# Patient Record
Sex: Male | Born: 1935
Health system: Southern US, Community
[De-identification: ages and names within clinical notes are randomized; demographics above are authoritative.]

## PROBLEM LIST (undated history)

## (undated) DIAGNOSIS — C801 Malignant (primary) neoplasm, unspecified: Secondary | ICD-10-CM

## (undated) DIAGNOSIS — H269 Unspecified cataract: Secondary | ICD-10-CM

## (undated) DIAGNOSIS — M1990 Unspecified osteoarthritis, unspecified site: Secondary | ICD-10-CM

## (undated) DIAGNOSIS — M5416 Radiculopathy, lumbar region: Secondary | ICD-10-CM

## (undated) DIAGNOSIS — E7800 Pure hypercholesterolemia, unspecified: Secondary | ICD-10-CM

## (undated) HISTORY — PX: CHOLECYSTECTOMY: SHX55

## (undated) HISTORY — PX: TRACHEOSTOMY TUBE PLACEMENT: SHX814

## (undated) HISTORY — PX: TONSILLECTOMY: SUR1361

## (undated) HISTORY — DX: Unspecified osteoarthritis, unspecified site: M19.90

## (undated) HISTORY — DX: Pure hypercholesterolemia, unspecified: E78.00

## (undated) HISTORY — PX: PROSTATE SURGERY: SHX751

## (undated) HISTORY — DX: Unspecified cataract: H26.9

## (undated) HISTORY — PX: APPENDECTOMY: SHX54

---

## 1988-12-18 DIAGNOSIS — C801 Malignant (primary) neoplasm, unspecified: Secondary | ICD-10-CM

## 1988-12-18 HISTORY — DX: Malignant (primary) neoplasm, unspecified: C80.1

## 2002-08-07 ENCOUNTER — Encounter: Payer: Self-pay | Admitting: Orthopedic Surgery

## 2002-08-11 ENCOUNTER — Inpatient Hospital Stay (HOSPITAL_COMMUNITY): Admission: RE | Admit: 2002-08-11 | Discharge: 2002-08-12 | Payer: Self-pay | Admitting: Orthopedic Surgery

## 2004-09-16 ENCOUNTER — Inpatient Hospital Stay (HOSPITAL_COMMUNITY): Admission: EM | Admit: 2004-09-16 | Discharge: 2004-09-16 | Payer: Self-pay | Admitting: Emergency Medicine

## 2004-09-17 ENCOUNTER — Inpatient Hospital Stay (HOSPITAL_COMMUNITY): Admission: EM | Admit: 2004-09-17 | Discharge: 2004-09-20 | Payer: Self-pay | Admitting: Emergency Medicine

## 2004-09-18 ENCOUNTER — Encounter (INDEPENDENT_AMBULATORY_CARE_PROVIDER_SITE_OTHER): Payer: Self-pay | Admitting: *Deleted

## 2005-03-15 IMAGING — US US ABDOMEN COMPLETE
1 series · 14 of 25 positions shown · non-contrast
Comparison: none

CLINICAL DATA: Abdominal pain.  
 COMPLETE ABDOMINAL ULTRASOUND ? 09/16/2003

[Series 1: unknown · 0.33mm/px · 14 of 59 slices shown]
[im 1/59]
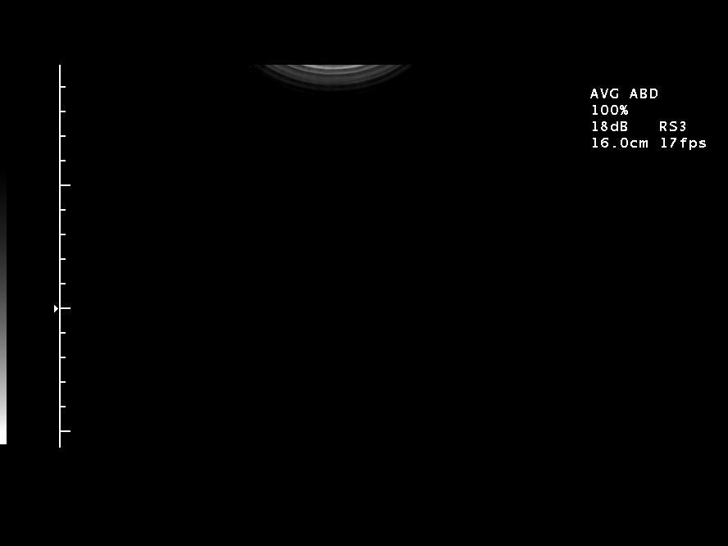
[im 5/59]
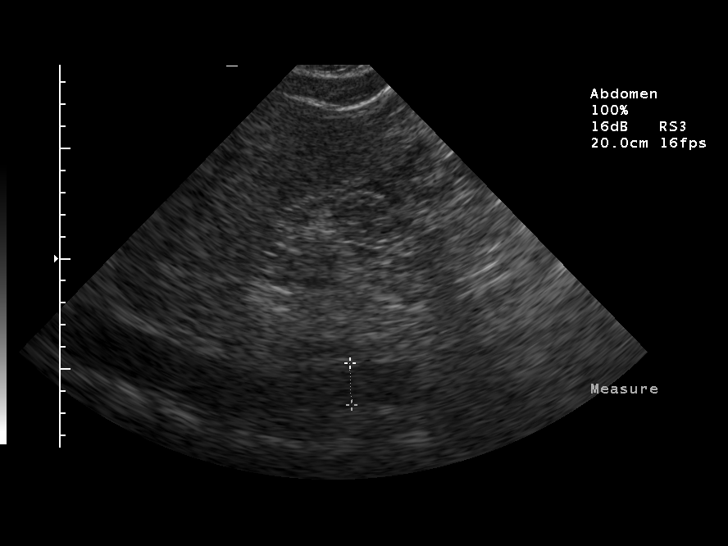
[im 10/59]
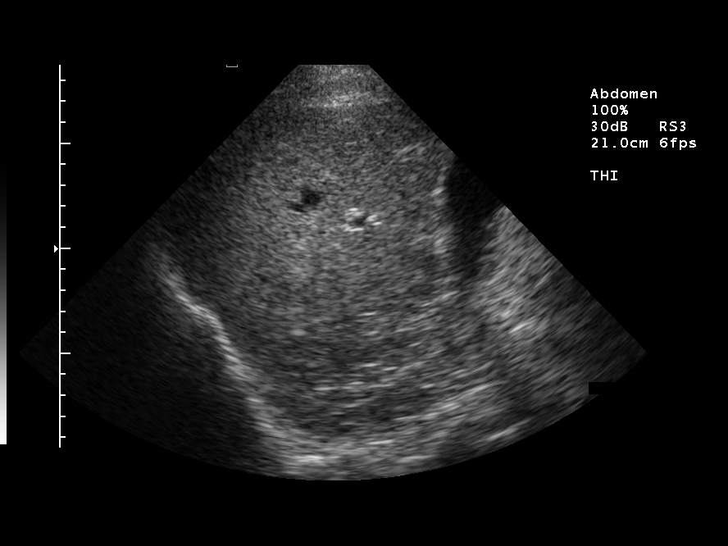
[im 15/59]
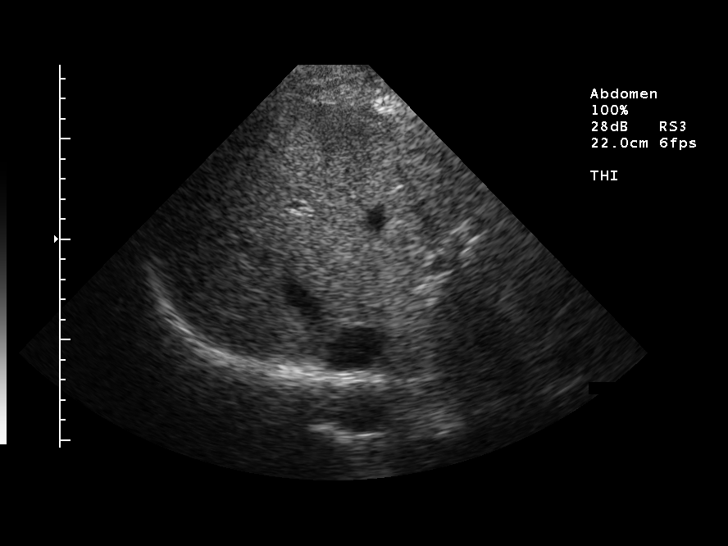
[im 20/59]
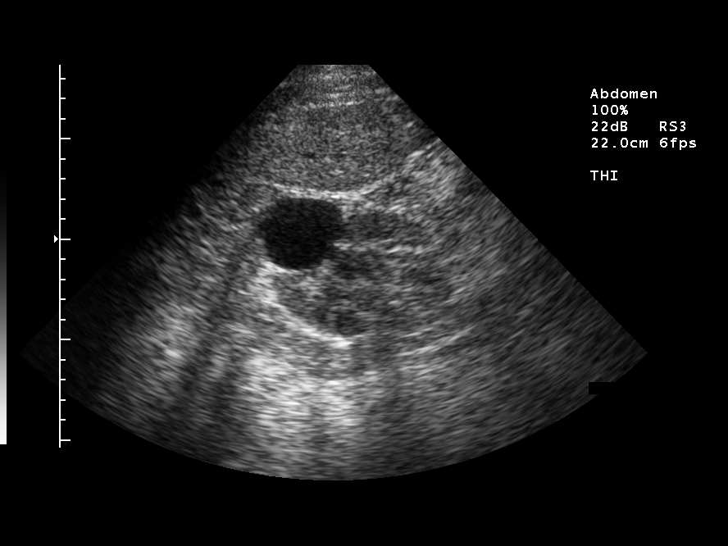
[im 22/59]
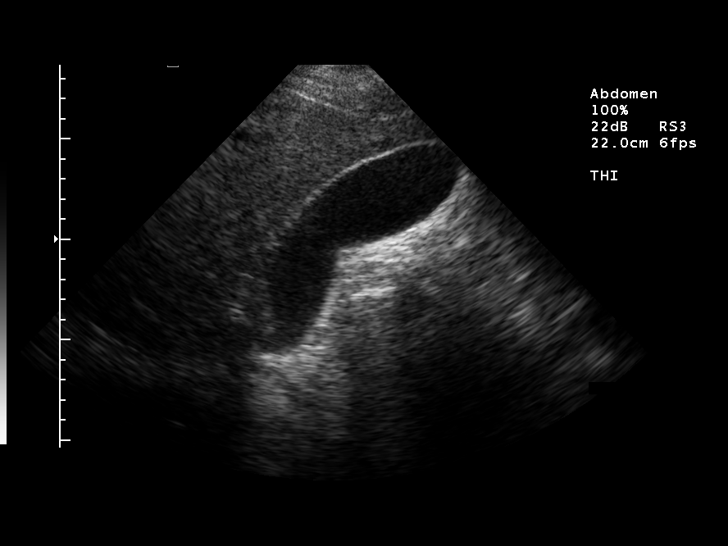
[im 27/59]
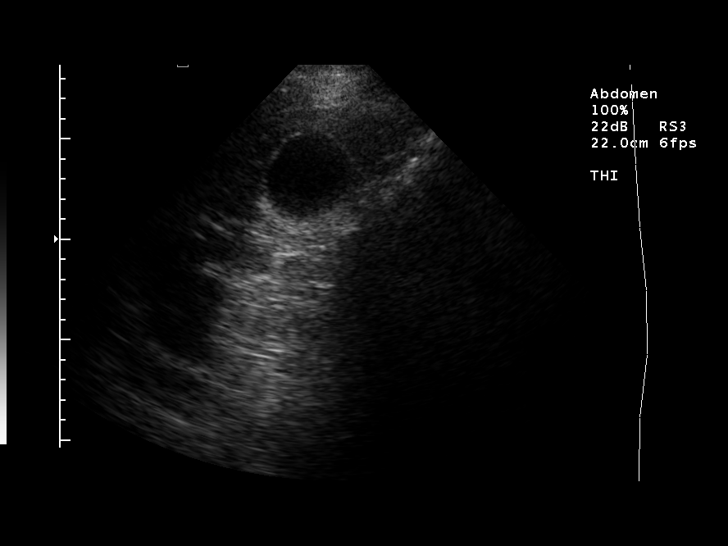
[im 32/59]
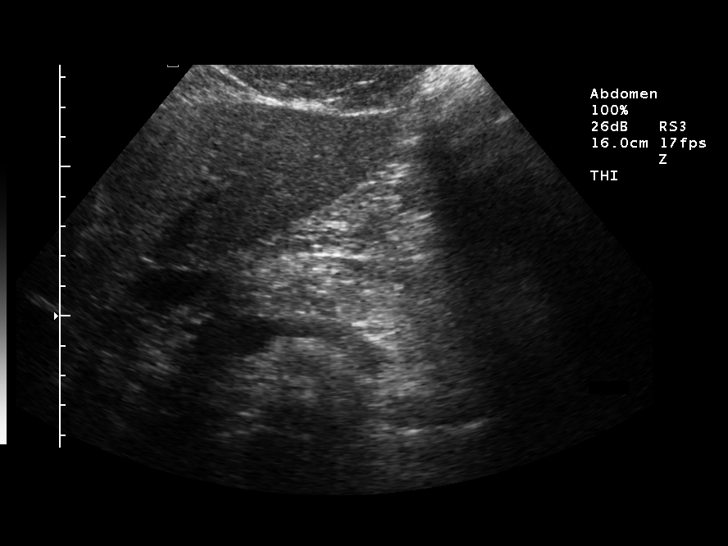
[im 37/59]
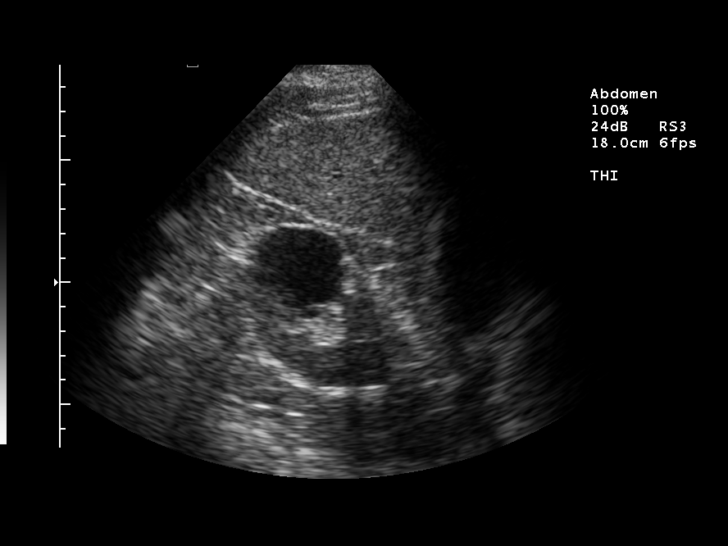
[im 39/59]
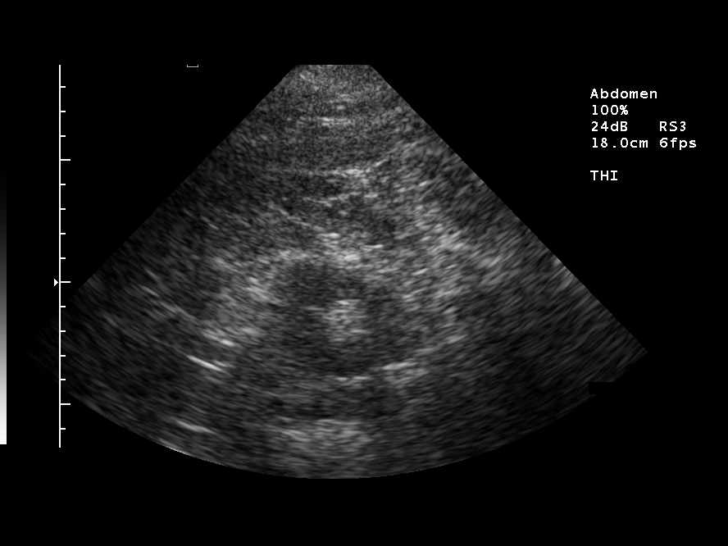
[im 44/59]
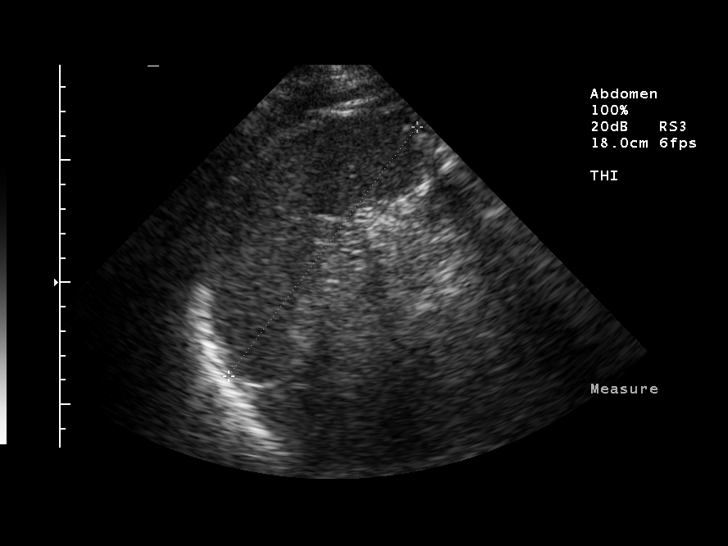
[im 49/59]
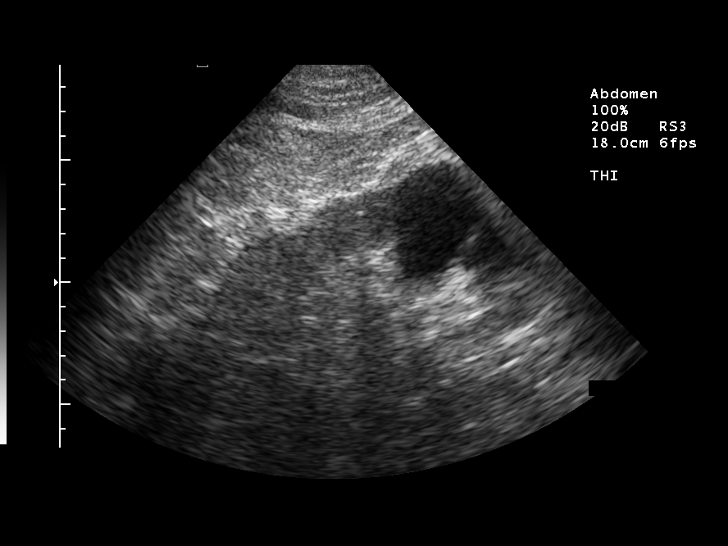
[im 54/59]
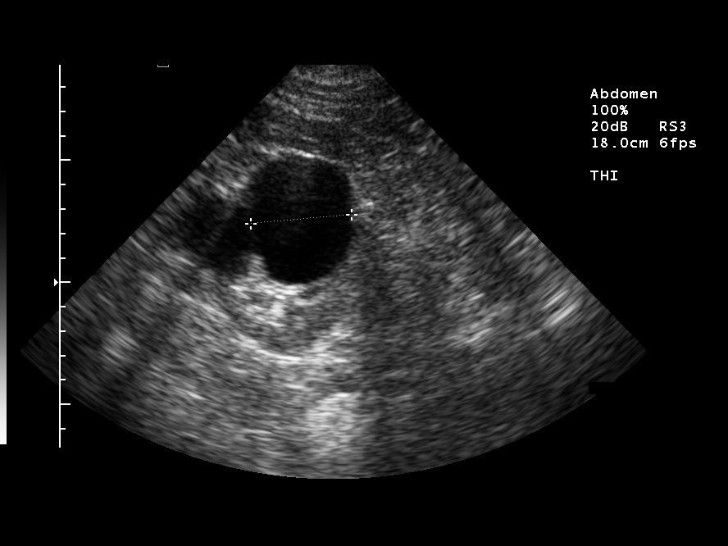
[im 59/59]
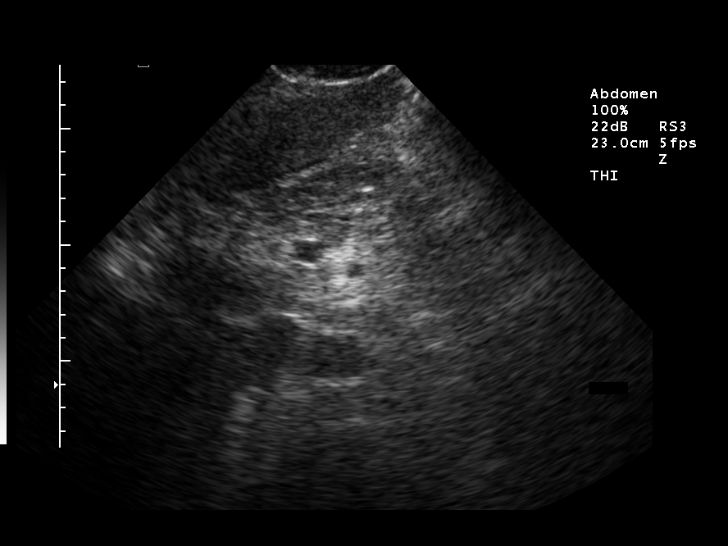

[14 of 25 positions shown; findings below may reference images not displayed]

FINDINGS: The liver, gallbladder, CBD, and spleen are unremarkable.  Kidneys bilaterally contain multiple cysts, the largest measuring 4.8 cm in the right kidney and 4.7 x 5.5 cm in the left kidney.  Remainder of the kidneys are unremarkable.  Limited visualization of the IVC, pancreas, and abdominal aorta from overlying bowel gas.  No free fluid. 
 IMPRESSION
 No acute abnormality. 
 Bilateral renal cysts.

## 2005-03-15 IMAGING — CR DG ABDOMEN ACUTE W/ 1V CHEST
5 series · 5 of 5 positions shown · non-contrast
Comparison: none

CLINICAL DATA: Epigastric pain. Previous removal of bladder tumor. 
 ABDOMEN (TWO VIEWS)
 Supine and upright views show an unremarkable bowel gas pattern without evidence of ileus or obstruction.  Multiple surgical clips are present in the pelvis.  The bony structures are unremarkable. 
 CHEST (ONE VIEW)
 The heart and mediastinum are normal.  The lungs are clear.  No free air is seen under the diaphragm. 
 IMPRESSION
 Negative acute abdominal series.

[view not recorded (1 of 5)]
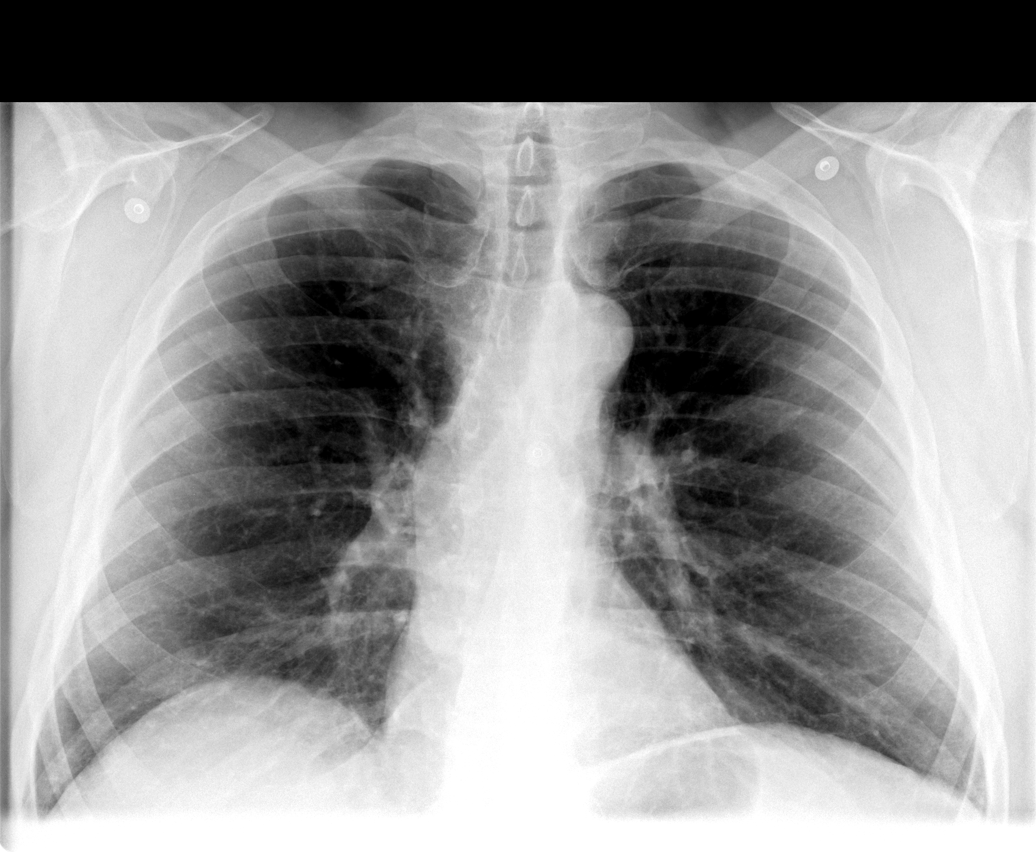

[view not recorded (2 of 5)]
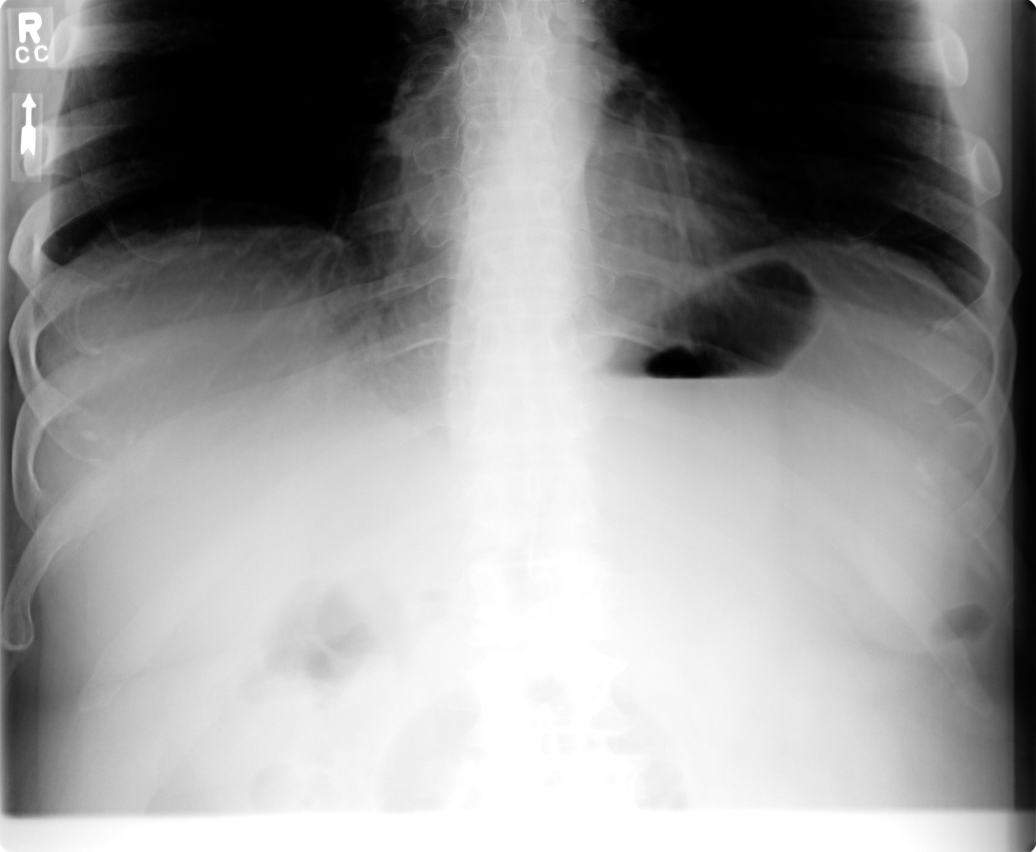

[view not recorded (3 of 5)]
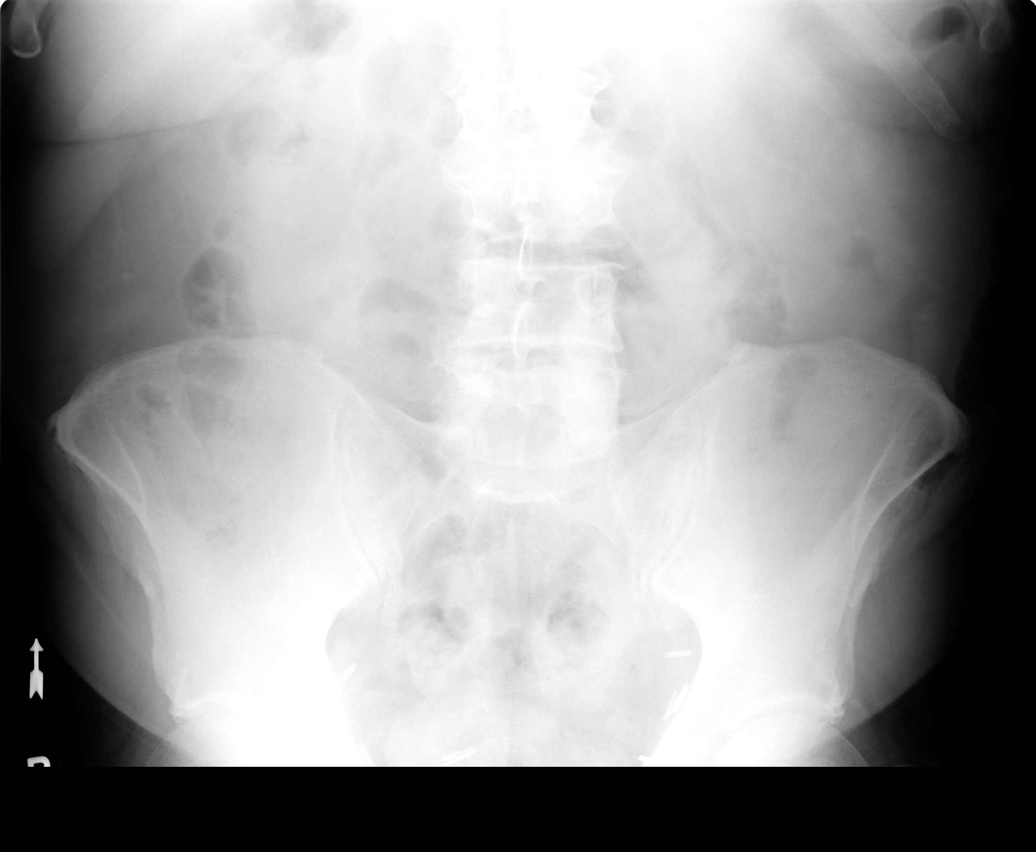

[view not recorded (4 of 5)]
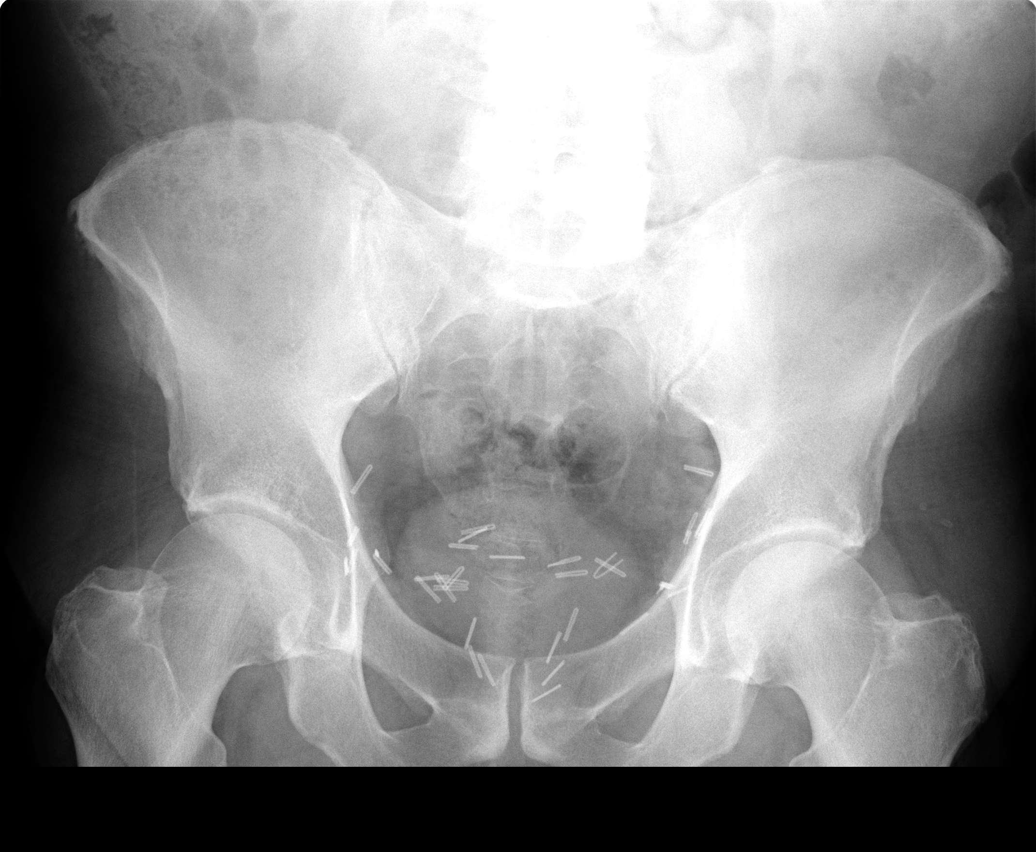

[view not recorded (5 of 5)]
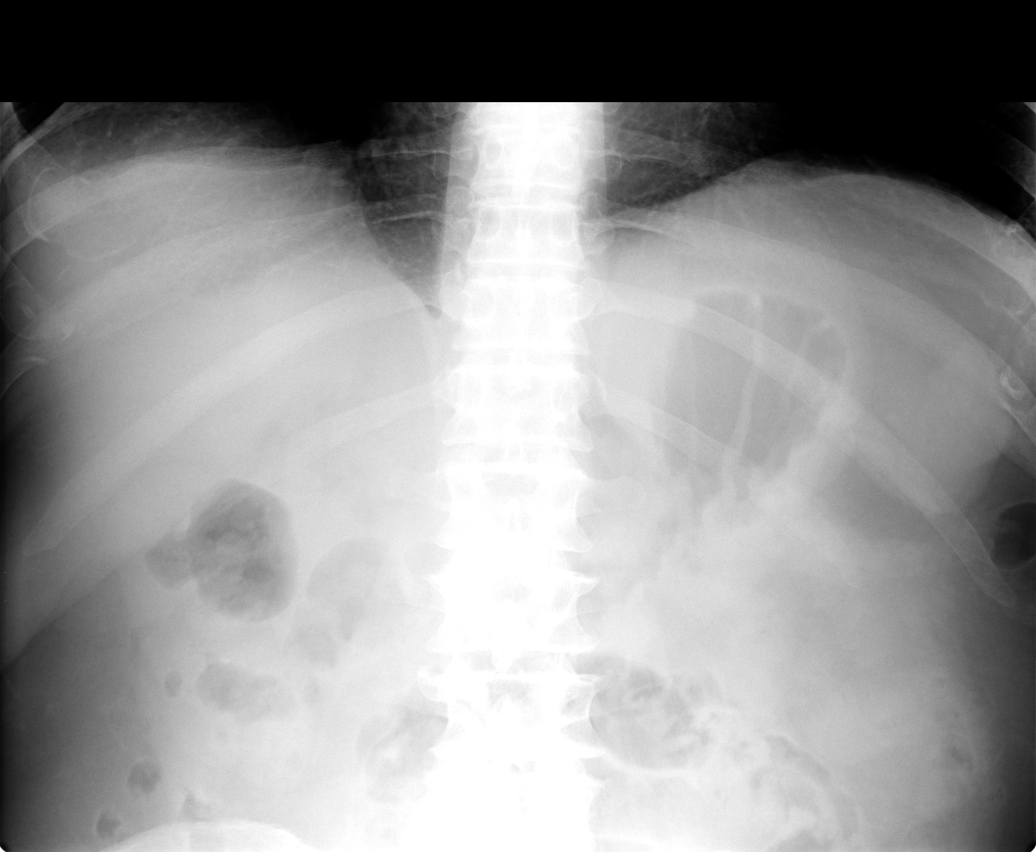

[5 of 5 positions shown; findings below may reference images not displayed]

## 2005-03-16 IMAGING — RF DG UGI W/ HIGH DENSITY W/KUB
19 of 24 series · 19 of 24 positions shown · non-contrast
Comparison: None.

CLINICAL DATA: Atypical chest pain. Patient ruled out for myocardial infarction. History of hiatal
hernia and gastroesophageal reflux disease.

HIGH-DENSITY UPPER GI SERIES  09/16/2004

[Series 1: run · 1 of 11 slices shown (1 of 19)]
[im 1/11]
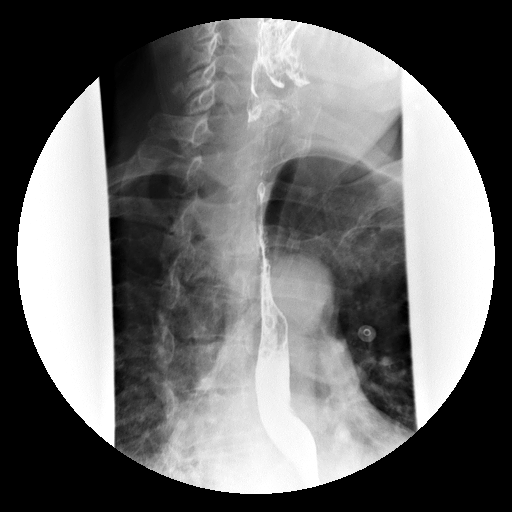

[Series 2: run · 1 of 1 slices shown (2 of 19)]
[im 1/1]
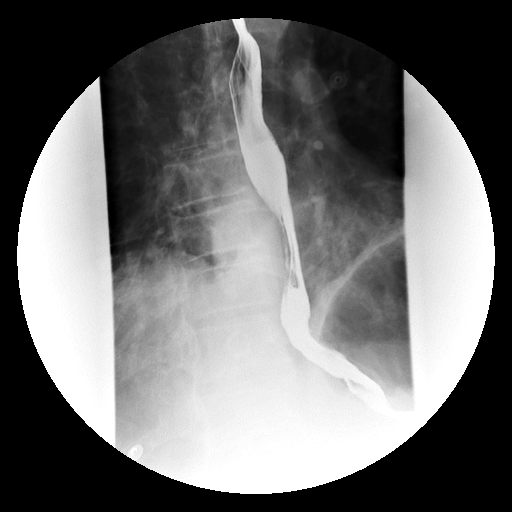

[Series 4: run · 1 of 1 slices shown (3 of 19)]
[im 1/1]
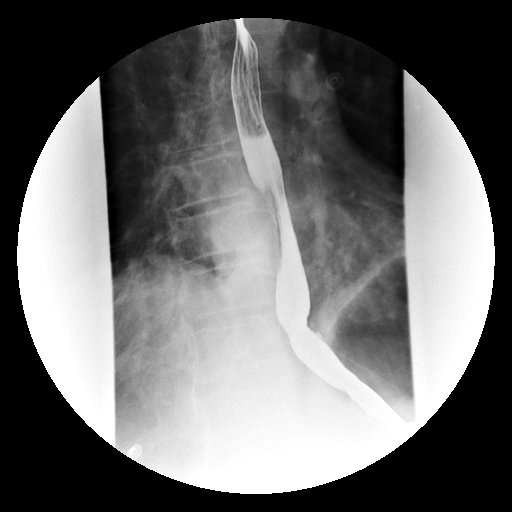

[Series 5: run · 1 of 1 slices shown (4 of 19)]
[im 1/1]
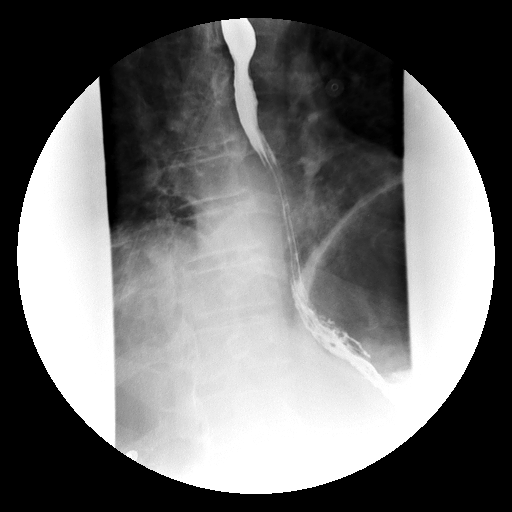

[Series 6: run · 1 of 1 slices shown (5 of 19)]
[im 1/1]
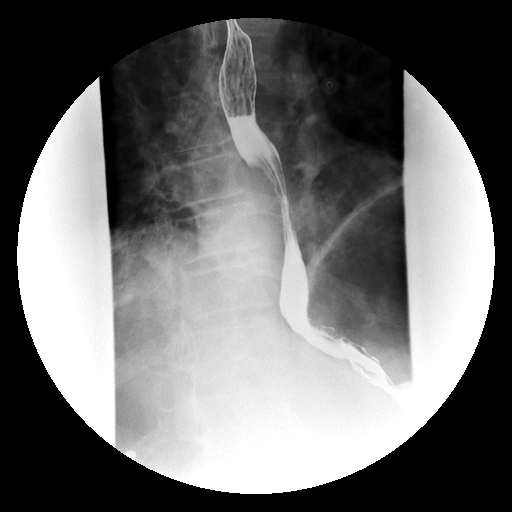

[Series 7: run · 1 of 1 slices shown (6 of 19)]
[im 1/1]
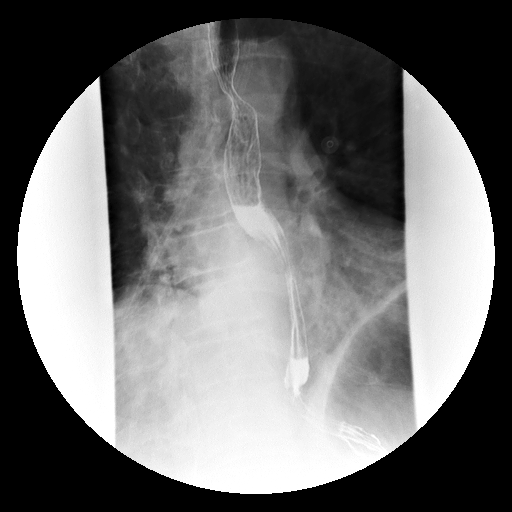

[Series 9: run · 1 of 1 slices shown (7 of 19)]
[im 1/1]
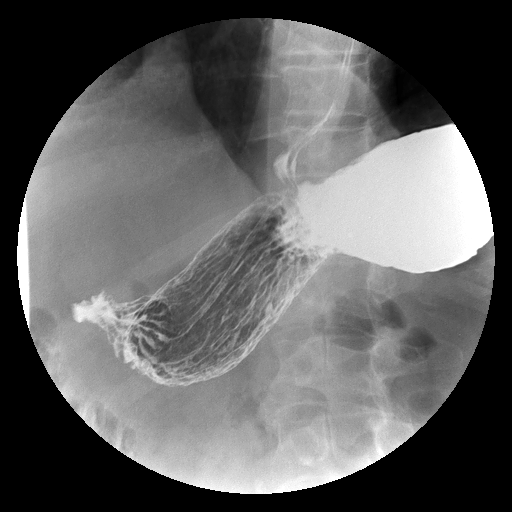

[Series 10: run · 1 of 1 slices shown (8 of 19)]
[im 1/1]
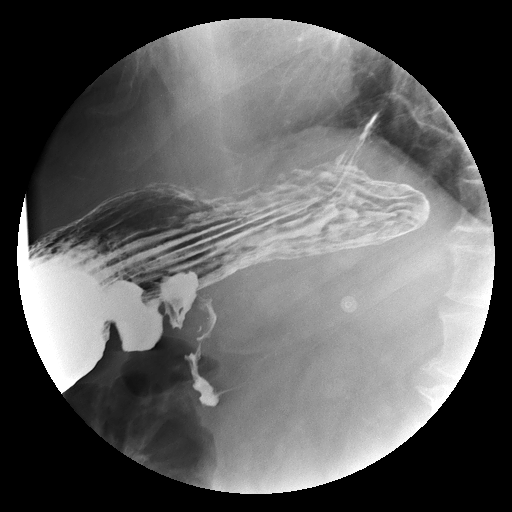

[Series 11: run · 1 of 1 slices shown (9 of 19)]
[im 1/1]
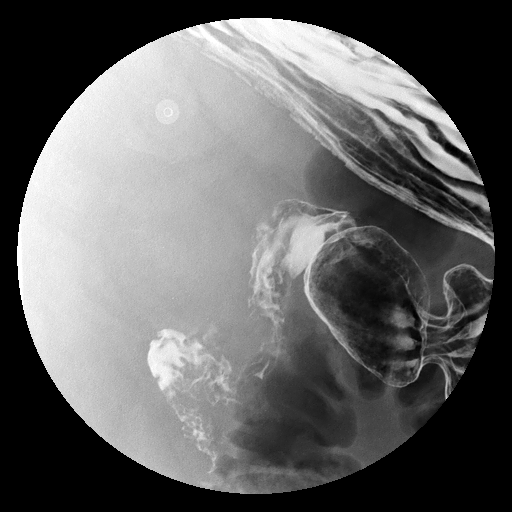

[Series 13: run · 1 of 1 slices shown (10 of 19)]
[im 1/1]
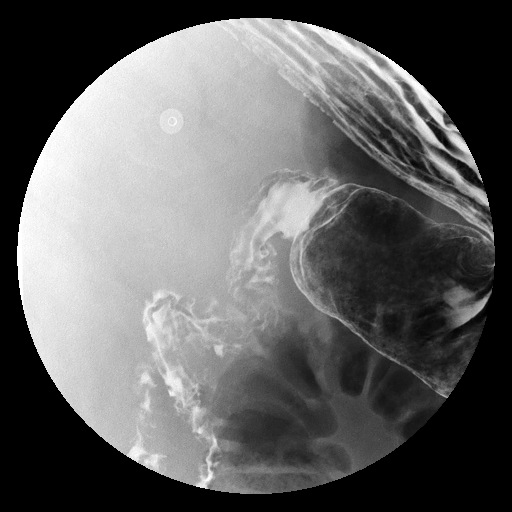

[Series 14: run · 1 of 1 slices shown (11 of 19)]
[im 1/1]
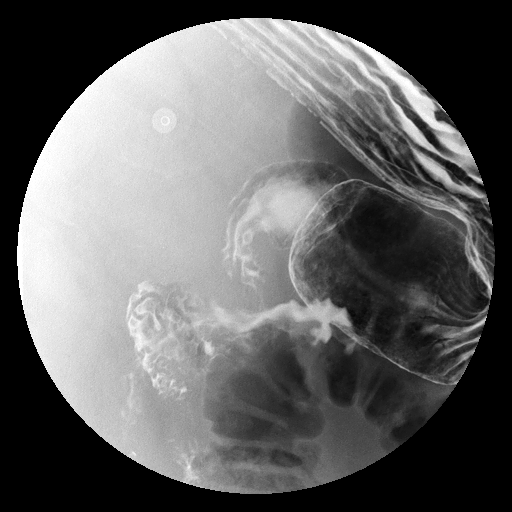

[Series 15: run · 1 of 1 slices shown (12 of 19)]
[im 1/1]
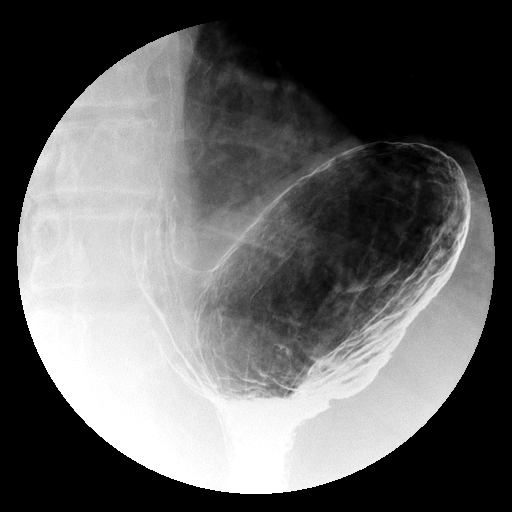

[Series 16: run · 1 of 13 slices shown (13 of 19)]
[im 1/13]
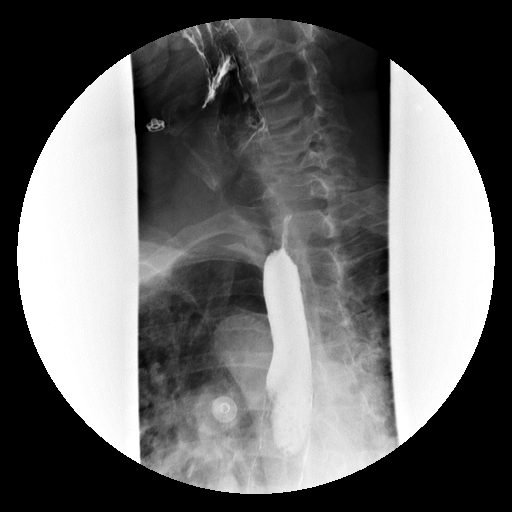

[Series 18: run · 1 of 1 slices shown (14 of 19)]
[im 1/1]
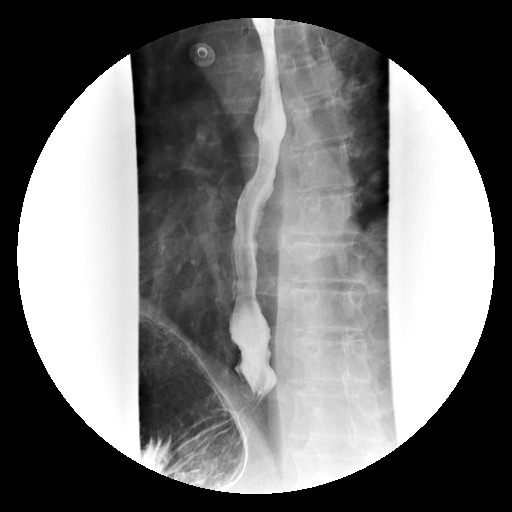

[Series 19: run · 1 of 1 slices shown (15 of 19)]
[im 1/1]
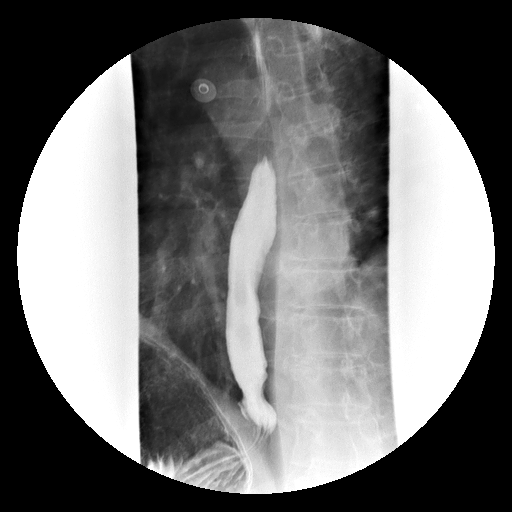

[Series 20: run · 1 of 1 slices shown (16 of 19)]
[im 1/1]
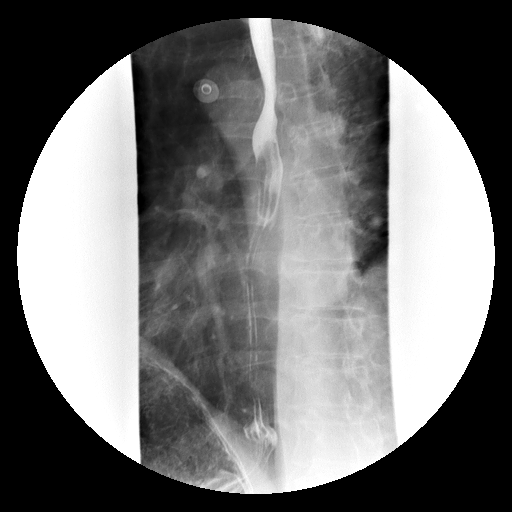

[Series 21: run · 1 of 1 slices shown (17 of 19)]
[im 1/1]
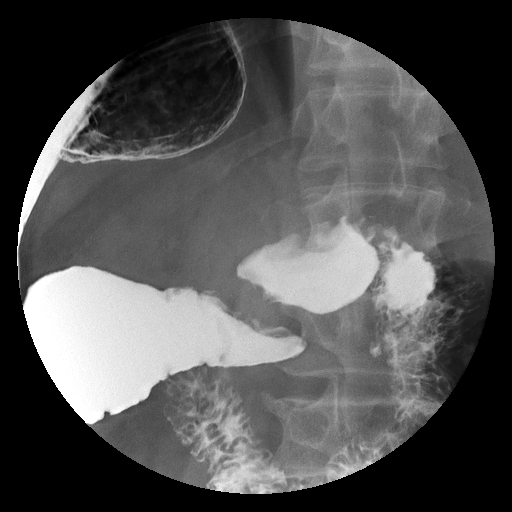

[Series 23: run · 1 of 1 slices shown (18 of 19)]
[im 1/1]
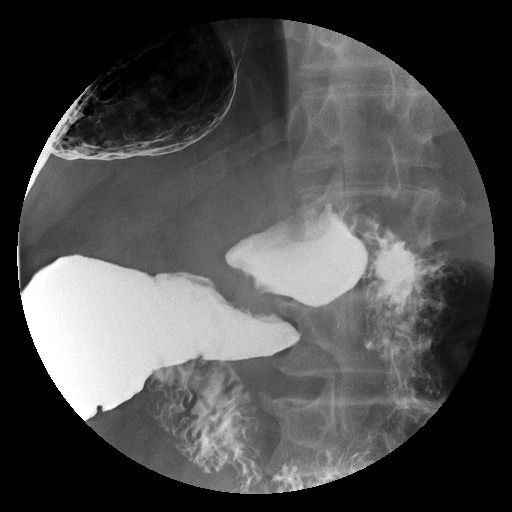

[Series 24: run · 1 of 1 slices shown (19 of 19)]
[im 1/1]
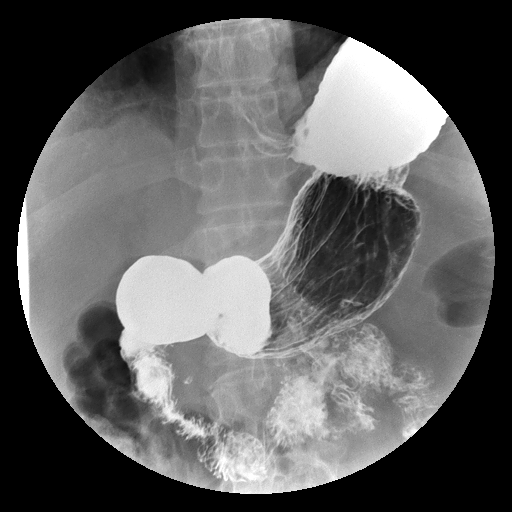

[19 of 24 positions shown; findings below may reference images not displayed]

FINDINGS: A preliminary scout AP supine abdominal film demonstrates a normal bowel gas pattern.
Surgical clips are present throughout the pelvis. No abnormal calcifications were identified.

The patient swallowed the thick and the barium record without difficulty. Primary esophageal
peristalsis is nearly normal, with minimal breakup of the primary wave. A few small tertiary
esophageal contractions were noted. There is a small hiatal hernia, and the esophagogastric
junction is patulous. A small amount of gastroesophageal reflux was noted during the examination.
No fixed esophageal strictures or masses were identified.

The stomach is otherwise normal in appearance. The stomach empties normally. The duodenal bulb and
duodenal sweep were unremarkable.
IMPRESSION: 1. Small hiatal hernia with mild gastroesophageal reflux.

2. Patulous esophagogastric junction.

3. Normal upper GI series otherwise.

## 2005-03-16 IMAGING — CR DG UGI W/ HIGH DENSITY W/KUB
2 series · 2 of 2 positions shown · non-contrast
Comparison: None.

CLINICAL DATA: Atypical chest pain. Patient ruled out for myocardial infarction. History of hiatal
hernia and gastroesophageal reflux disease.

HIGH-DENSITY UPPER GI SERIES  09/16/2004

[view not recorded (1 of 2)]
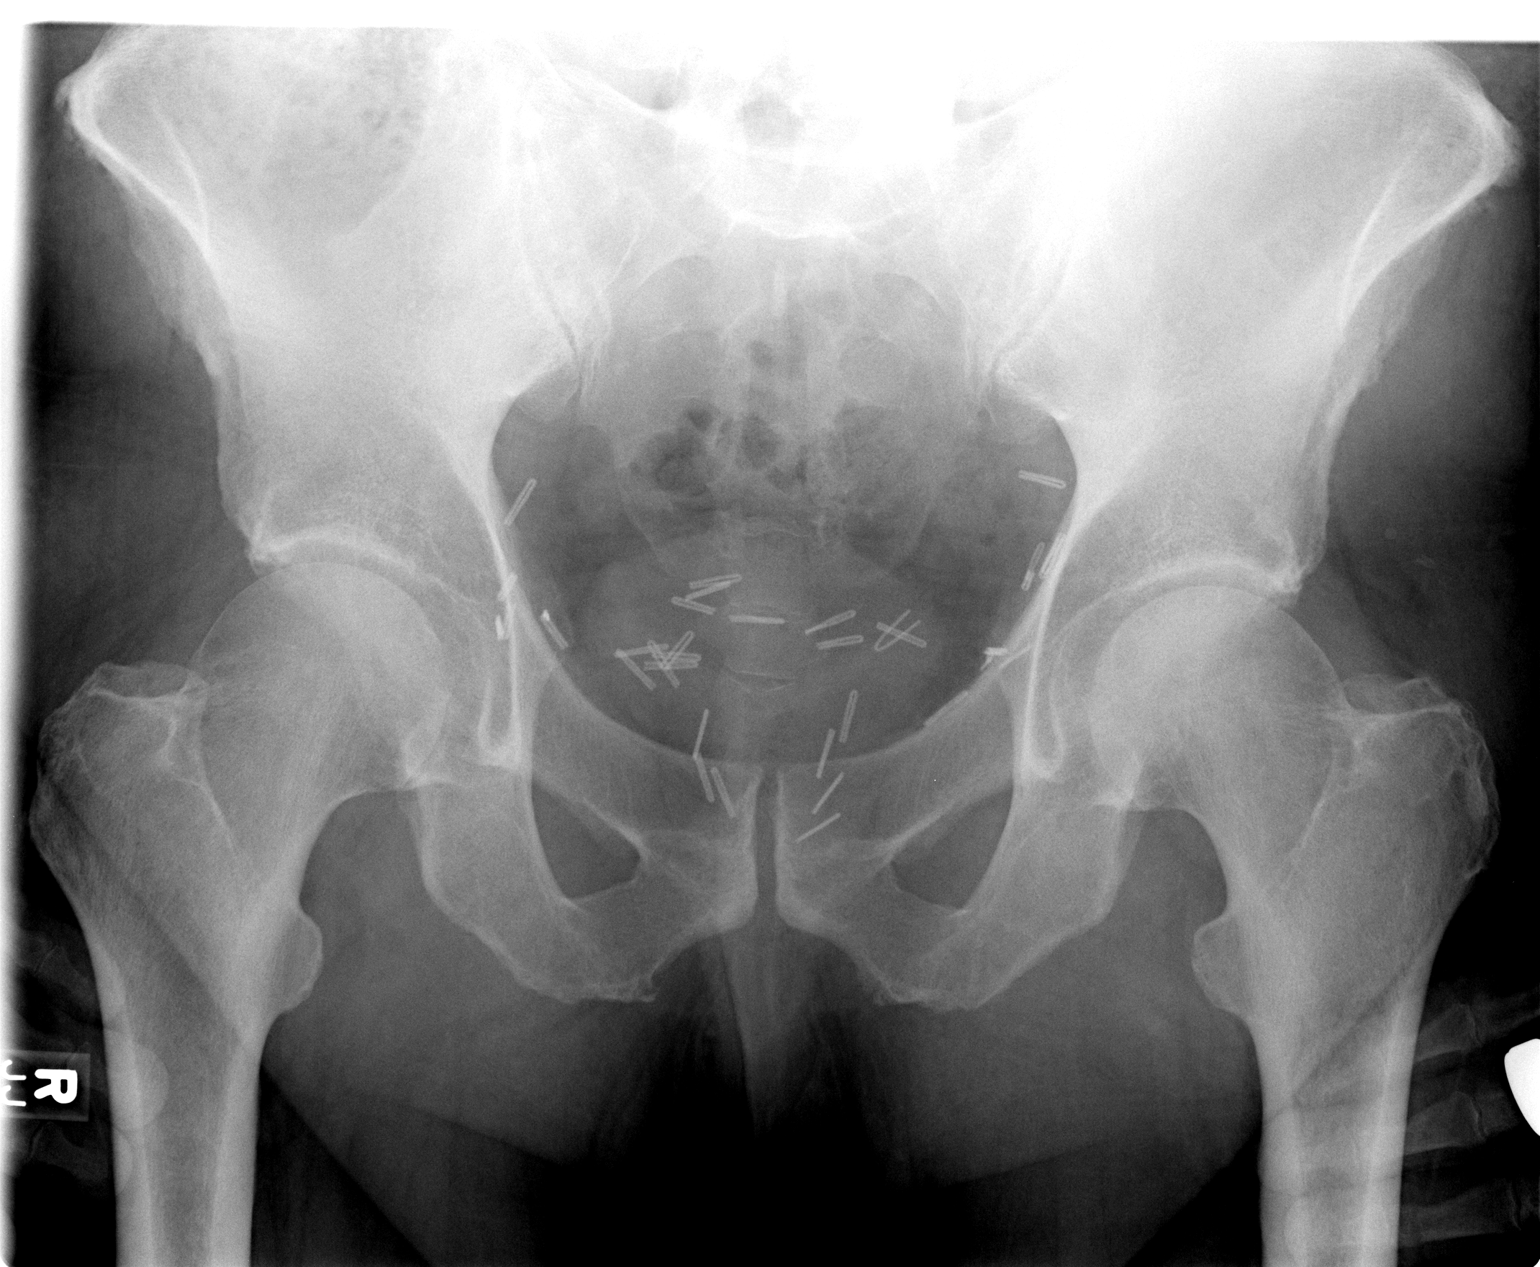

[view not recorded (2 of 2)]
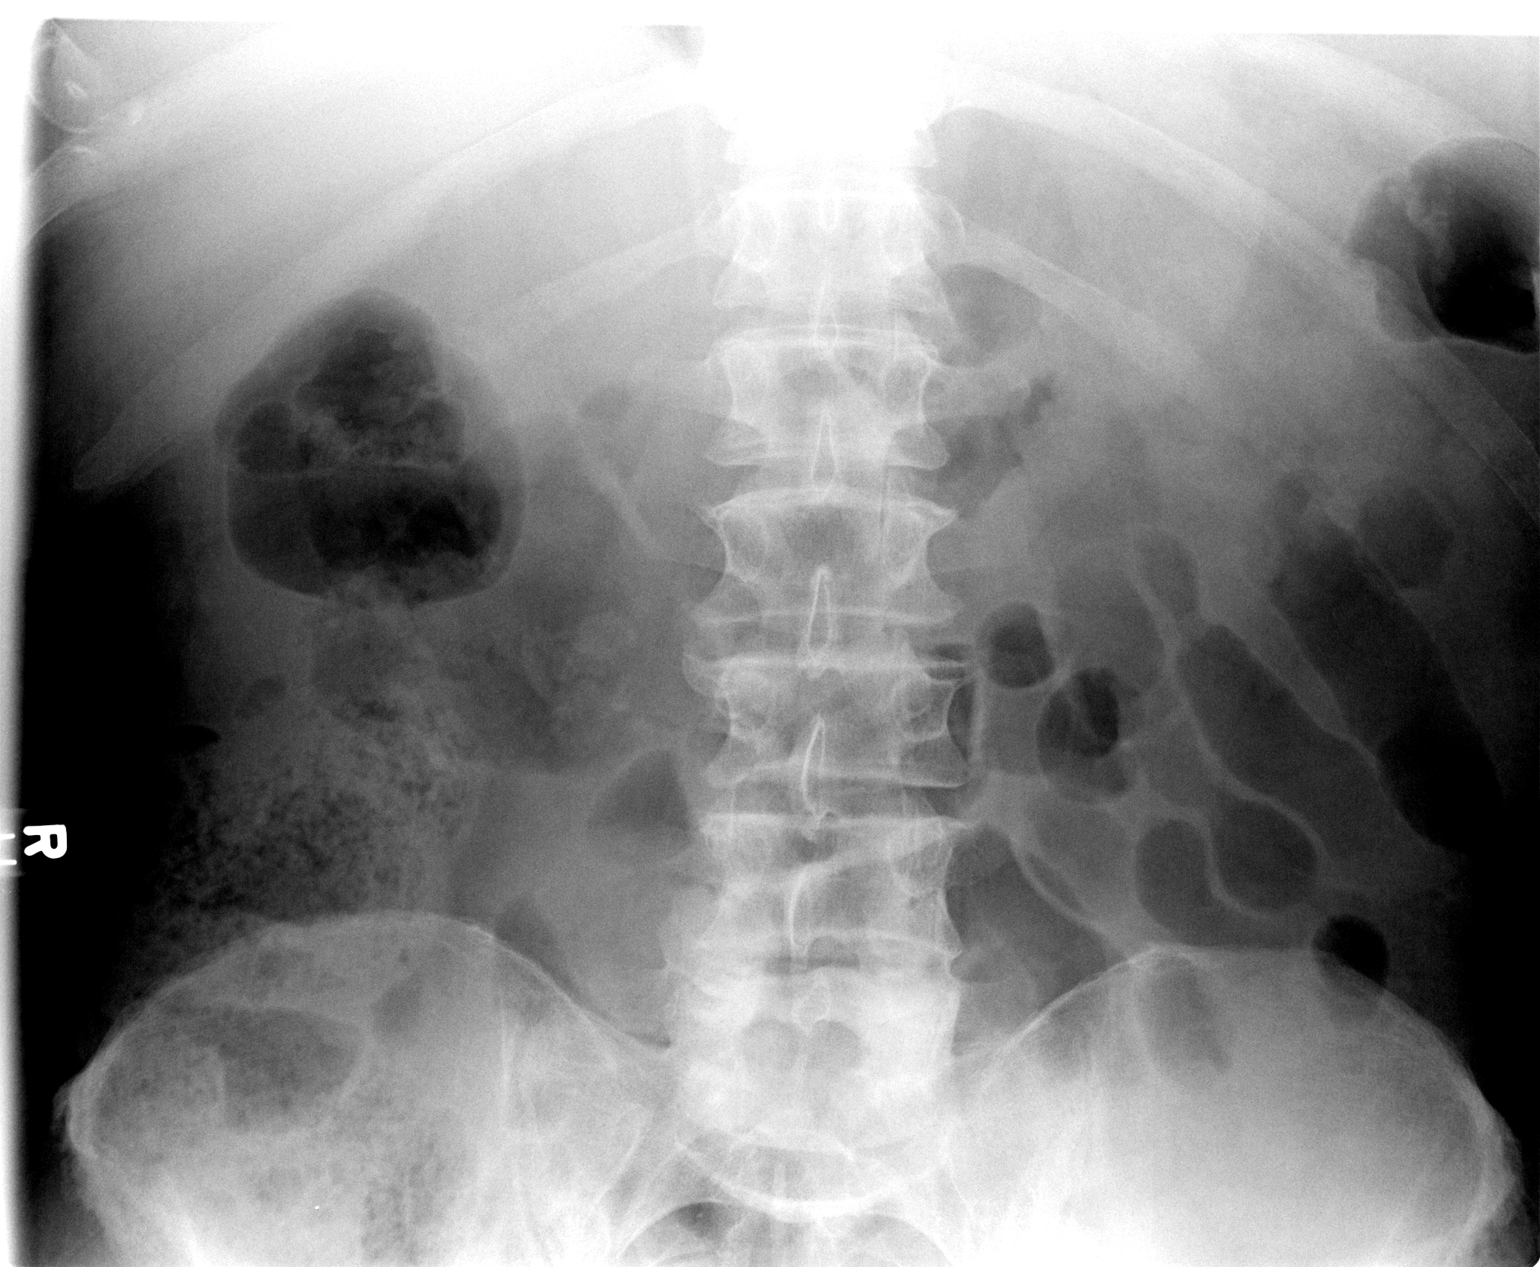

[2 of 2 positions shown; findings below may reference images not displayed]

FINDINGS: A preliminary scout AP supine abdominal film demonstrates a normal bowel gas pattern.
Surgical clips are present throughout the pelvis. No abnormal calcifications were identified.

The patient swallowed the thick and the barium record without difficulty. Primary esophageal
peristalsis is nearly normal, with minimal breakup of the primary wave. A few small tertiary
esophageal contractions were noted. There is a small hiatal hernia, and the esophagogastric
junction is patulous. A small amount of gastroesophageal reflux was noted during the examination.
No fixed esophageal strictures or masses were identified.

The stomach is otherwise normal in appearance. The stomach empties normally. The duodenal bulb and
duodenal sweep were unremarkable.
IMPRESSION: 1. Small hiatal hernia with mild gastroesophageal reflux.

2. Patulous esophagogastric junction.

3. Normal upper GI series otherwise.

## 2005-03-17 IMAGING — CT CT ANGIO CHEST
1 of 6 series · 19 of 30 positions shown · IV contrast (agent unspecified)
Comparison: none

CLINICAL DATA: chest pain
 CHEST CT ANGIO WITH CONTRAST:

[Series 6: chest/pe 1.0 b10f · axial · 0.84mm/px · z∈[-191,+102]mm · 19 of 648 slices shown]
[im 31/648  lung]
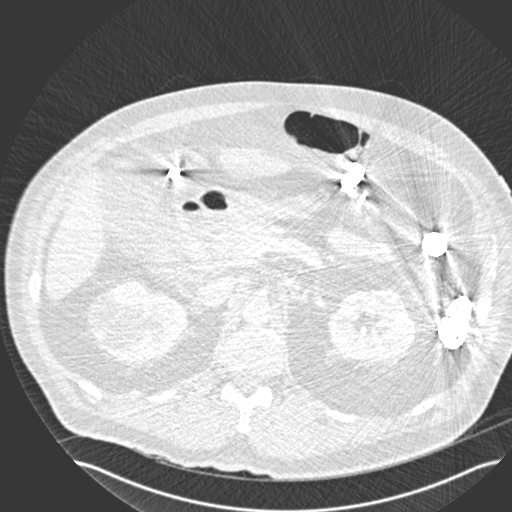
[im 62/648  mediastinal]
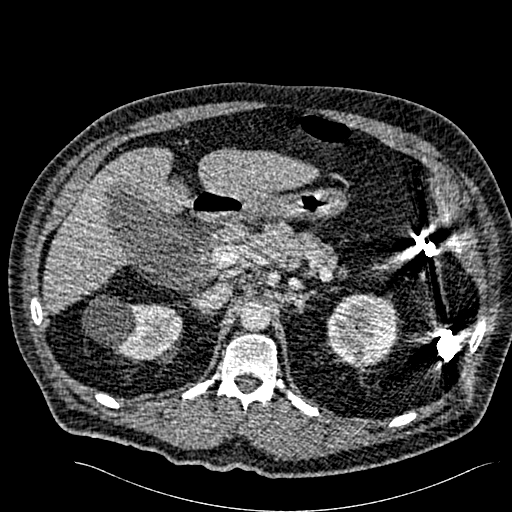
[im 93/648  lung]
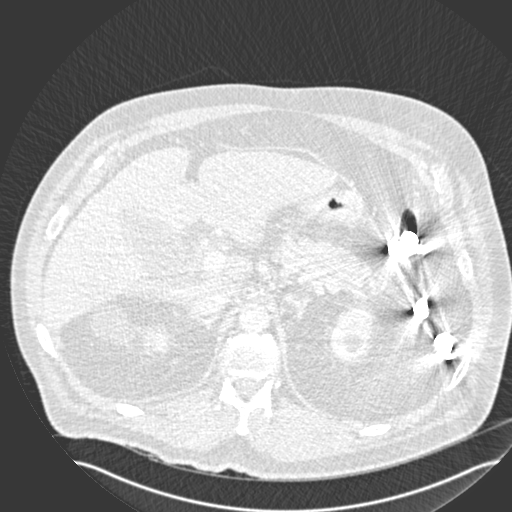
[im 124/648  mediastinal]
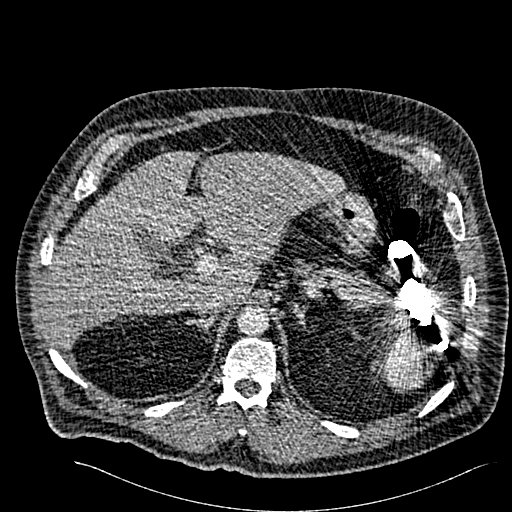
[im 155/648  lung]
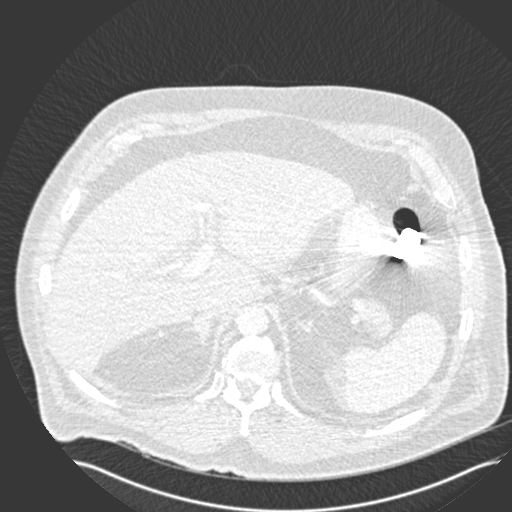
[im 207/648  mediastinal]
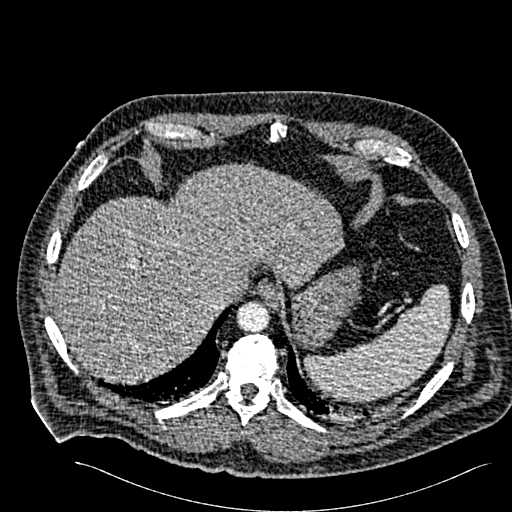
[im 216/648  lung]
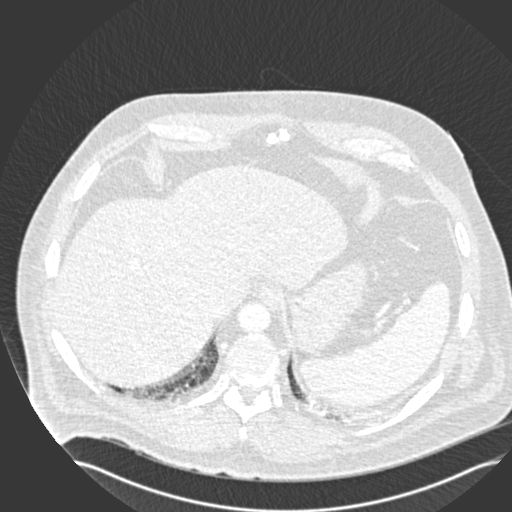
[im 247/648  mediastinal]
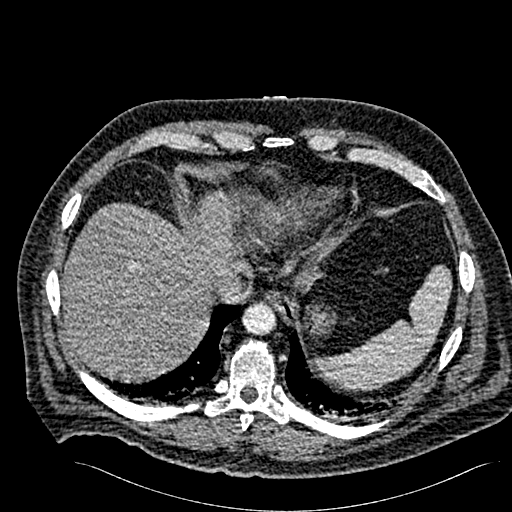
[im 278/648  lung]
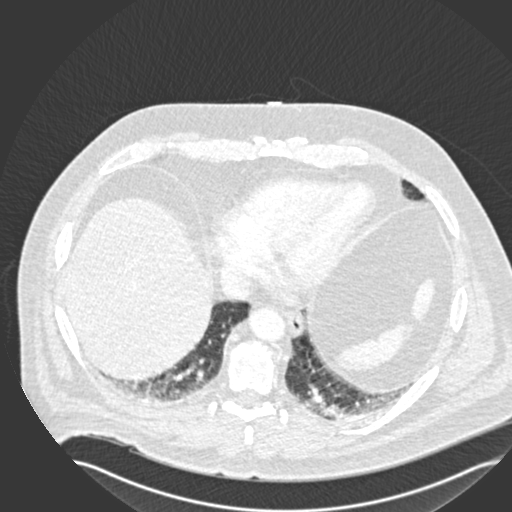
[im 309/648  mediastinal]
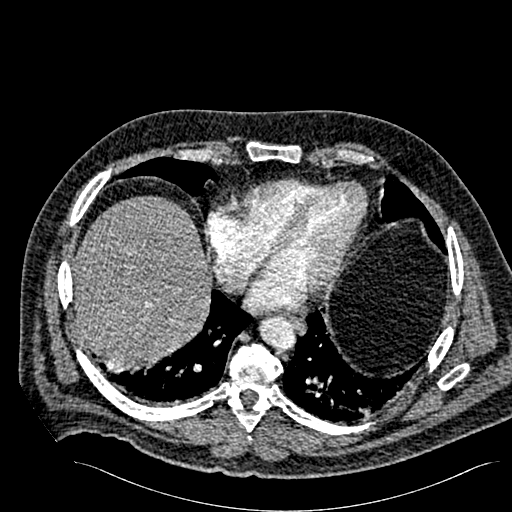
[im 339/648  lung]
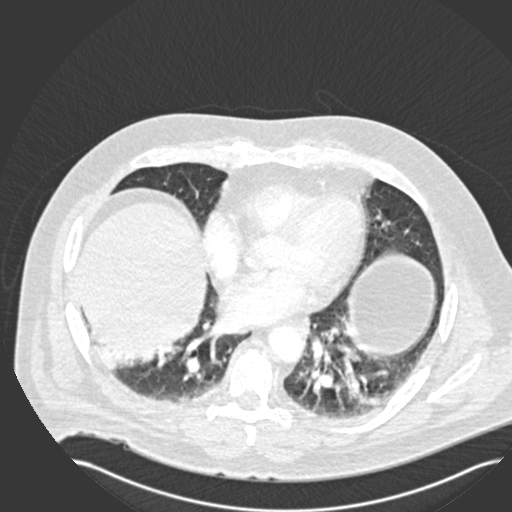
[im 370/648  mediastinal]
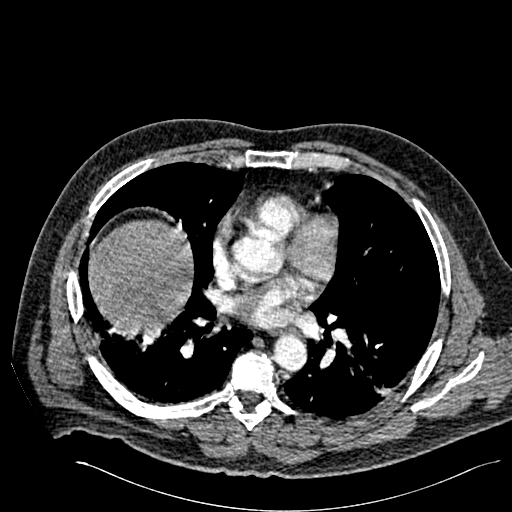
[im 401/648  lung]
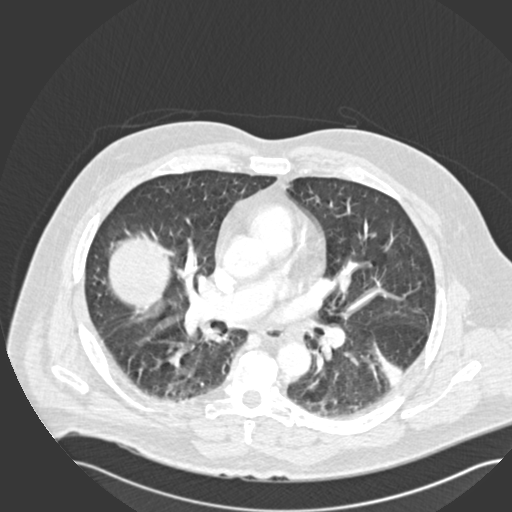
[im 432/648  mediastinal]
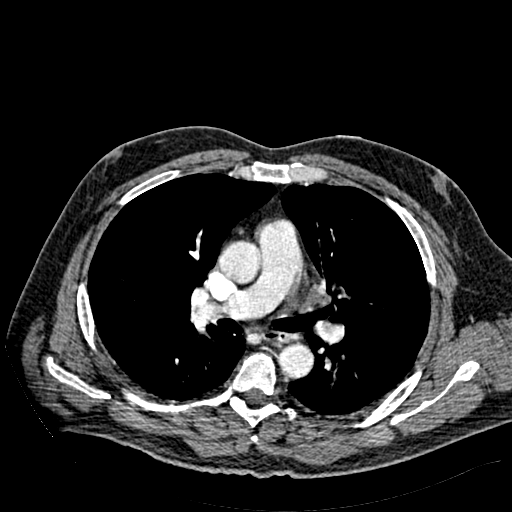
[im 493/648  lung]
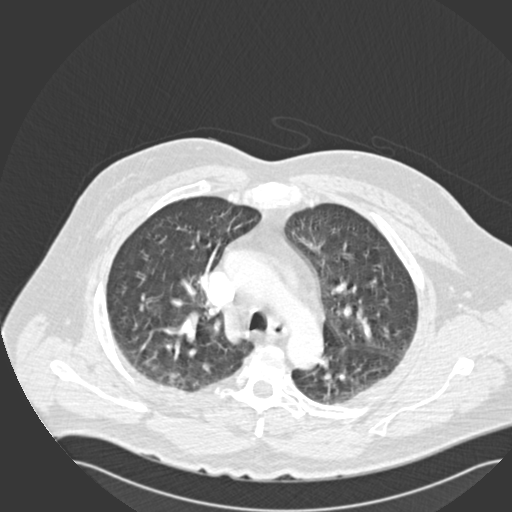
[im 524/648  mediastinal]
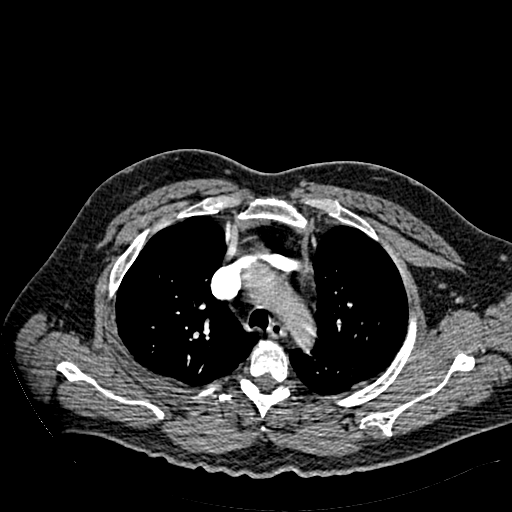
[im 555/648  lung]
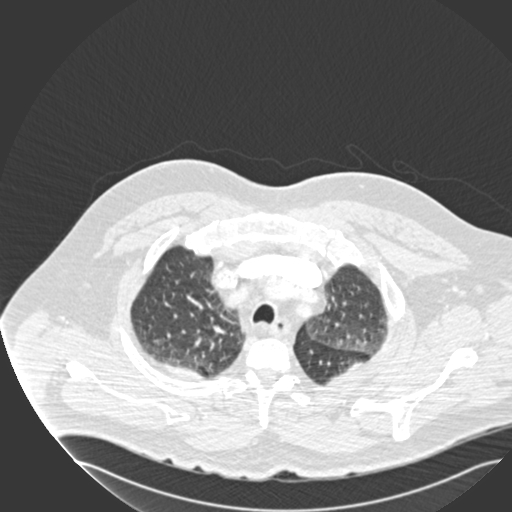
[im 586/648  mediastinal]
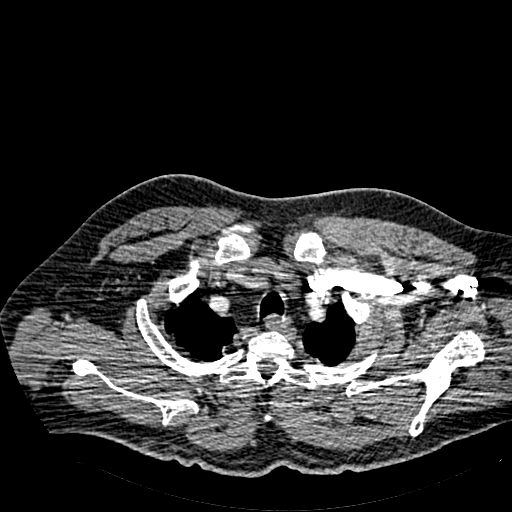
[im 617/648  lung]
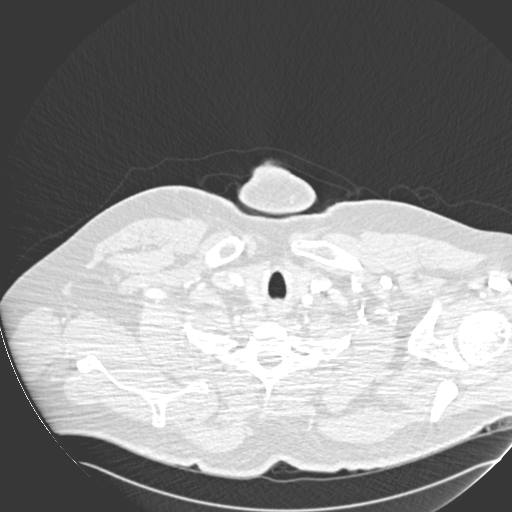

[19 of 30 positions shown; findings below may reference images not displayed]

Multidetector CT imaging of the chest was performed according to the protocol for detection of pulmonary embolism during IV bolus injection of 150 cc Omnipaque 300.  Coronal and sagittal plane reformatted images were also generated.
 Present exam is limited because of motion degradation.  No obvious pulmonary embolus detected.  Atelectatic changes lung bases.  Of note is an abnormal appearance of the gallbladder suggestive of cholecystitis.  This represents a distinct change when compared to ultrasound of 09/15/04.  Dr. Chikako has been informed of the results at the time of imaging.  Coronary artery calcification.  
 IMPRESSION
 Motion-degraded examination without obvious pulmonary embolus.  
 Abnormal appearance of the gallbladder suggestive of cholecystitis representing distinct change since ultrasound of 09/15/04.  
 Bibasilar atelectatic changes.

## 2005-03-17 IMAGING — CR DG CHEST 1V PORT
2 series · 2 of 2 positions shown · non-contrast
Comparison: none

HISTORY: Chest pain, dyspnea

PORTABLE CHEST ONE VIEW
Portable exam 6411 hours repeated at 6713 hours.
No prior studies for comparison.
Cardiomegaly and vascular congestion.
Bronchitic changes with mild bibasilar atelectasis.
Question minimal edema.
No effusion or pneumothorax.

[view not recorded (1 of 2)]
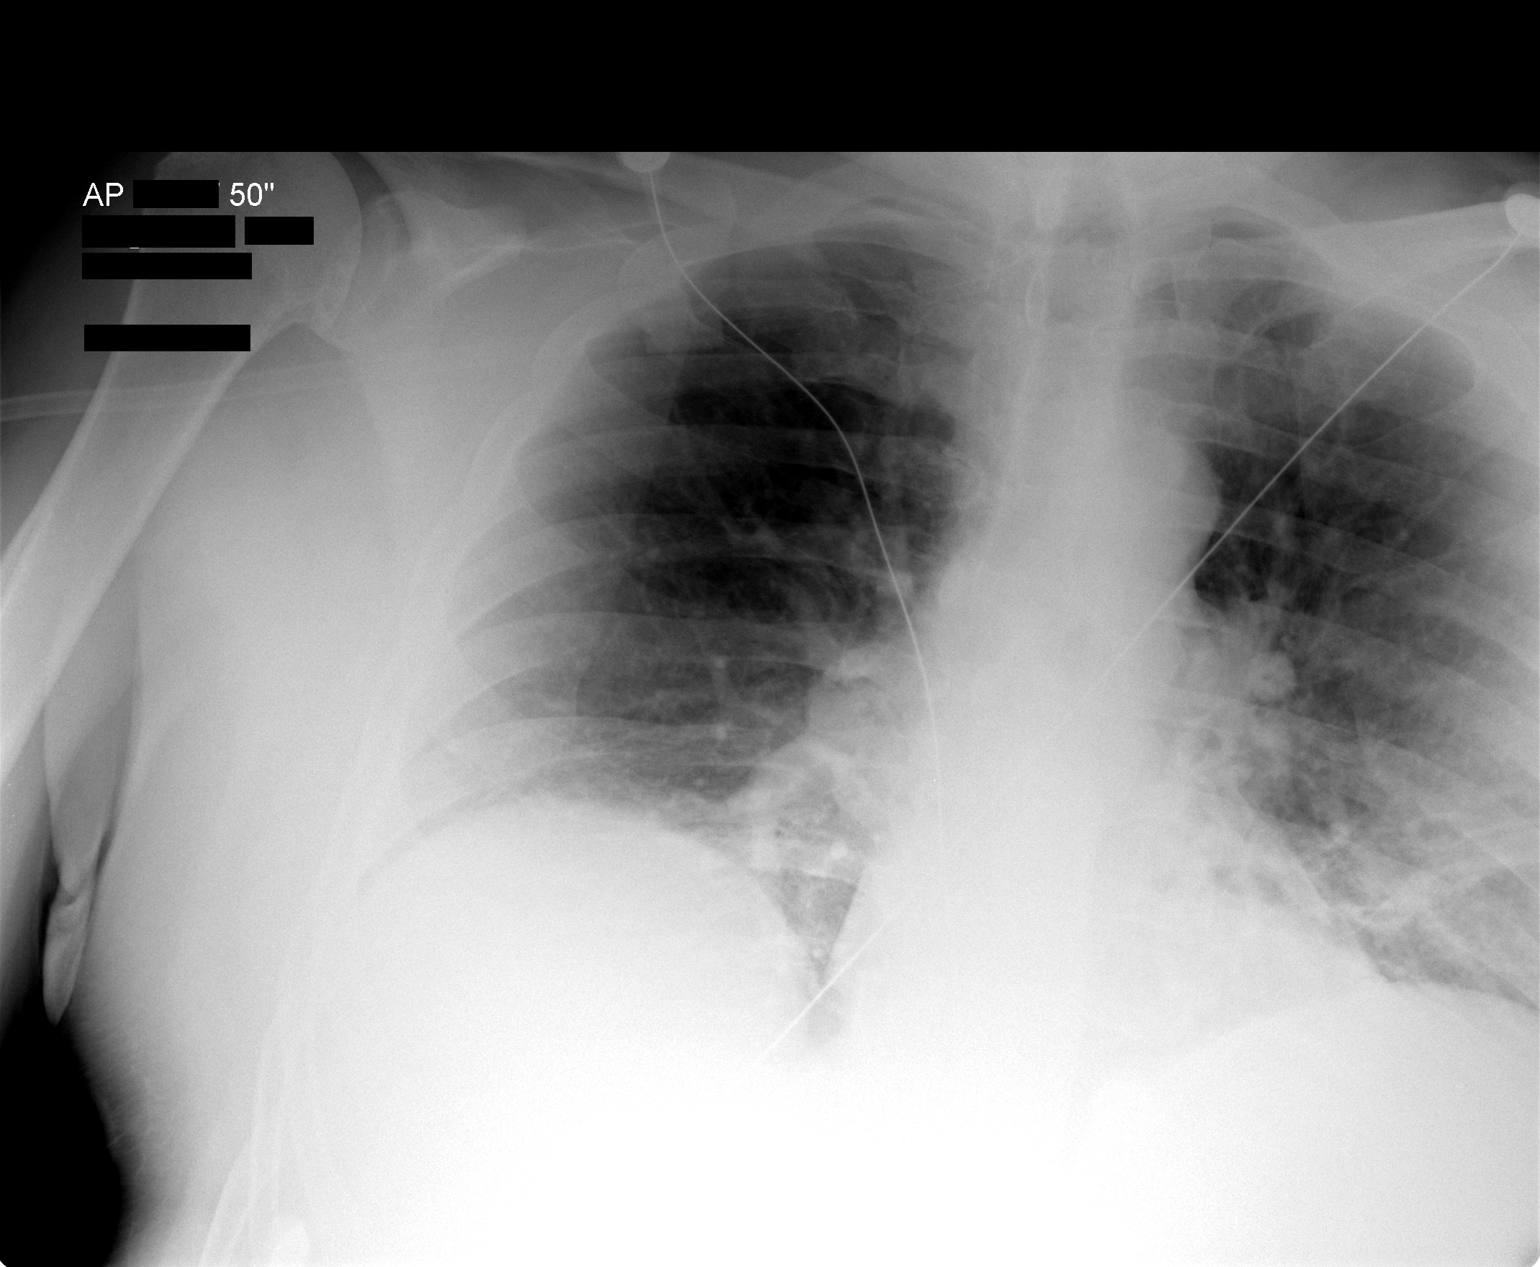

[view not recorded (2 of 2)]
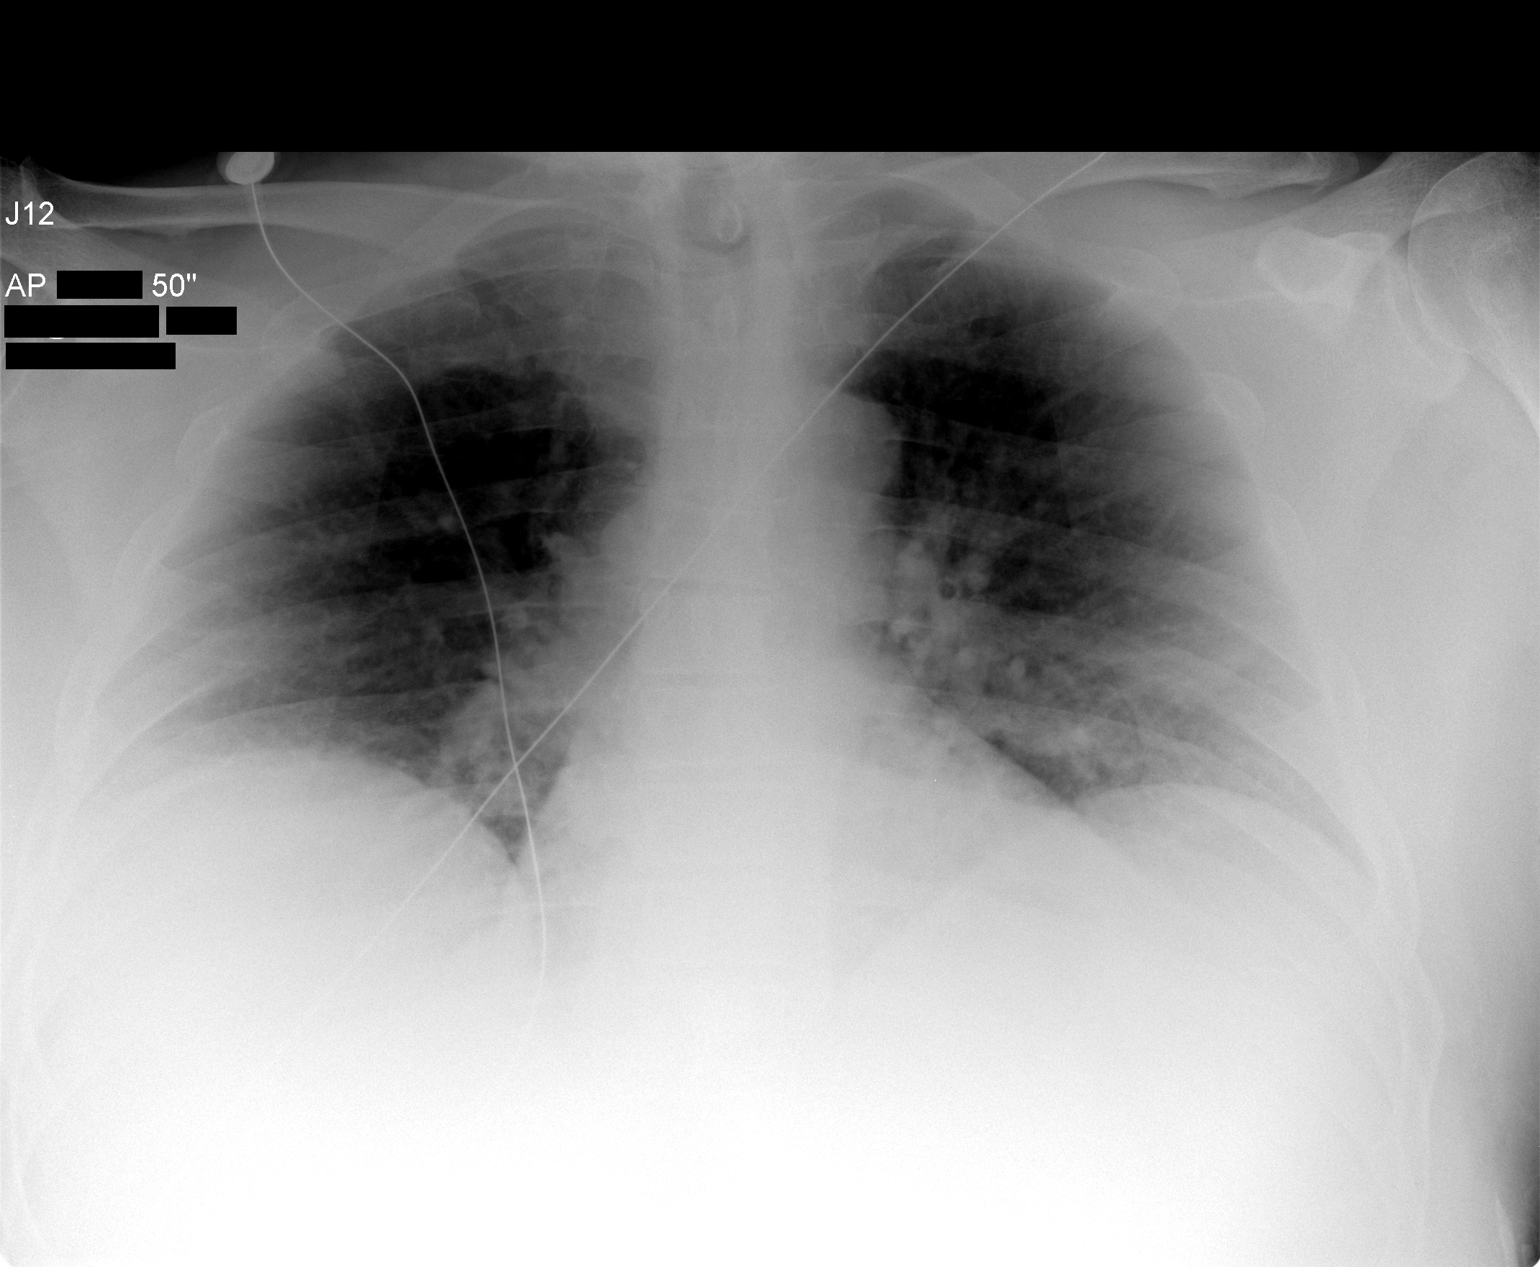

[2 of 2 positions shown; findings below may reference images not displayed]

IMPRESSION: Cardiomegaly with vascular congestion, question minimal edema. Bibasilar atelectasis.

## 2005-03-18 IMAGING — NM NM LIVER FUNCTION STUDY
1 series · 1 of 1 positions shown · non-contrast
Comparison: none

CLINICAL DATA: 68-year-old with chest pain.

[hepatobiliary · 1.20mm/px · 1 of 1 slices shown]
[im 1/1  full-range]
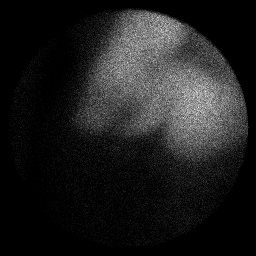

[1 of 1 positions shown; findings below may reference images not displayed]

HEPATOBILIARY NUCLEAR MEDICINE LIVER FUNCTION

8.3 mCi of technetium 99m Choletec was given intravenously 45 by minutes after injection, 2 mg of morphine was given intravenously before additional imaging.

There is appropriate, early activity within the liver. Biliary tree activity is seen as early as 15
minutes. However, the gallbladder is not visualized. Following the administration of morphine,
additional images are acquired for 30 minutes, showing no evidence for gallbladder filling.
IMPRESSION: 1. Patent common bile duct.

2. No evidence of gallbladder filling, consistent with cholecystitis.

## 2005-03-18 IMAGING — RF DG CHOLANGIOGRAM OPERATIVE
1 series · 7 of 7 positions shown · non-contrast
Comparison: none

HISTORY: Cholecystectomy, evaluate common bile duct patency

[Series 1: run · 7 of 7 slices shown]
[im 1/7]
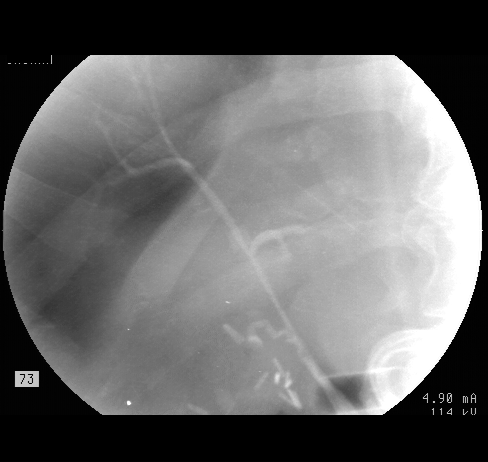
[im 2/7]
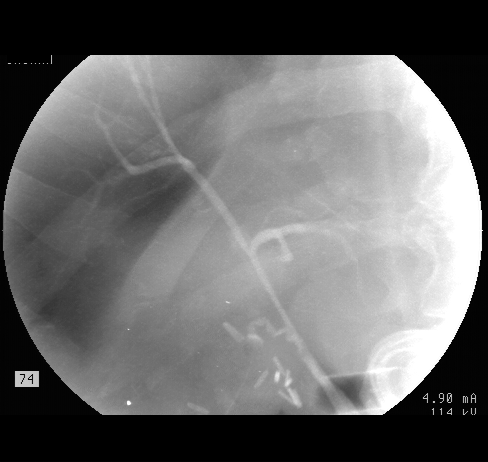
[im 3/7]
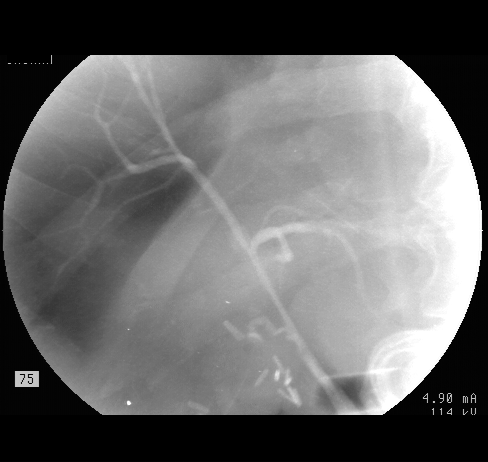
[im 4/7]
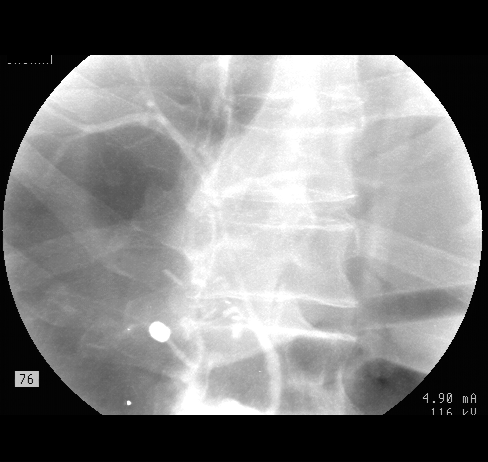
[im 5/7]
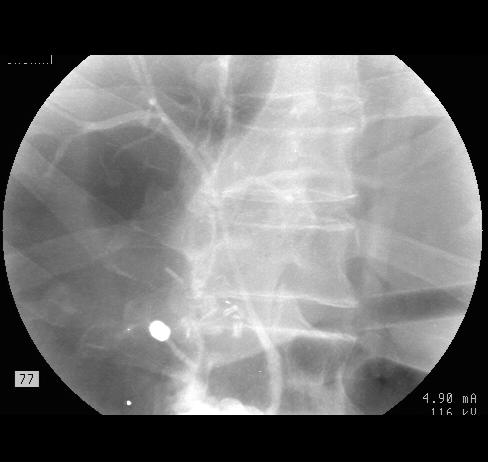
[im 6/7]
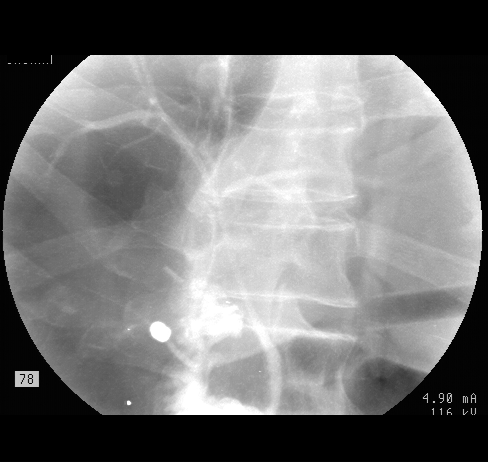
[im 7/7]
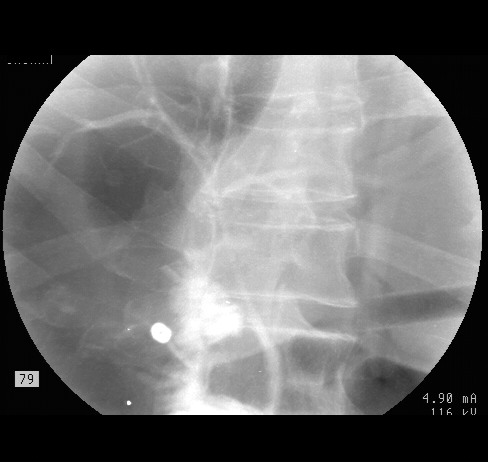

[7 of 7 positions shown; findings below may reference images not displayed]

INTRAOPERATIVE CHOLANGIOGRAM

Intraoperative cholangiography performed by Dr. Maddox.
Common hepatic and common bile ducts appear normal in caliber without filling defect to suggest
stone.
No stricture or obstruction .
Spillage of contrast into duodenum identified indicating common duct patency.
Very distal aspect of common bile duct and the ampulla are not included on the submitted images.
IMPRESSION: No evidence of choledocholithiasis as above.

## 2005-06-16 ENCOUNTER — Ambulatory Visit: Payer: Self-pay | Admitting: Internal Medicine

## 2005-06-17 HISTORY — PX: POLYPECTOMY: SHX149

## 2005-06-17 HISTORY — PX: COLONOSCOPY: SHX174

## 2005-06-30 ENCOUNTER — Encounter (INDEPENDENT_AMBULATORY_CARE_PROVIDER_SITE_OTHER): Payer: Self-pay | Admitting: Specialist

## 2005-06-30 ENCOUNTER — Ambulatory Visit: Payer: Self-pay | Admitting: Internal Medicine

## 2007-04-30 ENCOUNTER — Ambulatory Visit (HOSPITAL_COMMUNITY): Admission: RE | Admit: 2007-04-30 | Discharge: 2007-05-01 | Payer: Self-pay | Admitting: Orthopedic Surgery

## 2007-05-16 ENCOUNTER — Encounter: Admission: RE | Admit: 2007-05-16 | Discharge: 2007-06-13 | Payer: Self-pay | Admitting: Orthopedic Surgery

## 2007-12-19 HISTORY — PX: TRACHEOSTOMY CLOSURE: SHX458

## 2008-06-09 ENCOUNTER — Ambulatory Visit: Payer: Self-pay | Admitting: Critical Care Medicine

## 2008-06-09 ENCOUNTER — Inpatient Hospital Stay (HOSPITAL_COMMUNITY): Admission: EM | Admit: 2008-06-09 | Discharge: 2008-08-06 | Payer: Self-pay | Admitting: Emergency Medicine

## 2008-06-09 ENCOUNTER — Ambulatory Visit: Payer: Self-pay | Admitting: Thoracic Surgery

## 2008-06-12 ENCOUNTER — Encounter: Payer: Self-pay | Admitting: Thoracic Surgery

## 2008-06-24 ENCOUNTER — Ambulatory Visit: Payer: Self-pay | Admitting: Infectious Disease

## 2008-06-30 ENCOUNTER — Encounter (INDEPENDENT_AMBULATORY_CARE_PROVIDER_SITE_OTHER): Payer: Self-pay | Admitting: Surgery

## 2008-07-02 ENCOUNTER — Encounter (INDEPENDENT_AMBULATORY_CARE_PROVIDER_SITE_OTHER): Payer: Self-pay | Admitting: Nephrology

## 2008-07-06 ENCOUNTER — Encounter (INDEPENDENT_AMBULATORY_CARE_PROVIDER_SITE_OTHER): Payer: Self-pay | Admitting: General Surgery

## 2008-07-22 HISTORY — PX: LAYERED WOUND CLOSURE: SHX430

## 2008-07-22 HISTORY — PX: RIB PLATING: SHX5079

## 2008-07-23 ENCOUNTER — Ambulatory Visit: Payer: Self-pay | Admitting: Physical Medicine & Rehabilitation

## 2008-07-24 ENCOUNTER — Ambulatory Visit: Payer: Self-pay | Admitting: Thoracic Surgery

## 2008-08-06 ENCOUNTER — Ambulatory Visit: Payer: Self-pay | Admitting: Physical Medicine & Rehabilitation

## 2008-08-06 ENCOUNTER — Inpatient Hospital Stay (HOSPITAL_COMMUNITY)
Admission: RE | Admit: 2008-08-06 | Discharge: 2008-09-03 | Payer: Self-pay | Admitting: Physical Medicine & Rehabilitation

## 2008-09-25 ENCOUNTER — Encounter
Admission: RE | Admit: 2008-09-25 | Discharge: 2008-11-30 | Payer: Self-pay | Admitting: Physical Medicine & Rehabilitation

## 2008-09-30 ENCOUNTER — Ambulatory Visit: Payer: Self-pay | Admitting: Thoracic Surgery

## 2008-09-30 ENCOUNTER — Encounter: Admission: RE | Admit: 2008-09-30 | Discharge: 2008-09-30 | Payer: Self-pay | Admitting: Thoracic Surgery

## 2008-10-02 ENCOUNTER — Ambulatory Visit: Payer: Self-pay | Admitting: Physical Medicine & Rehabilitation

## 2008-10-19 ENCOUNTER — Encounter
Admission: RE | Admit: 2008-10-19 | Discharge: 2008-12-17 | Payer: Self-pay | Admitting: Physical Medicine & Rehabilitation

## 2008-11-30 ENCOUNTER — Ambulatory Visit: Payer: Self-pay | Admitting: Physical Medicine & Rehabilitation

## 2008-12-02 ENCOUNTER — Ambulatory Visit: Payer: Self-pay | Admitting: Thoracic Surgery

## 2008-12-02 ENCOUNTER — Encounter: Admission: RE | Admit: 2008-12-02 | Discharge: 2008-12-02 | Payer: Self-pay | Admitting: Thoracic Surgery
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
## 2008-12-14 IMAGING — CR DG CHEST 1V PORT
1 series · 1 of 1 positions shown · non-contrast
Comparison: Chest radiograph 06/15/2008

CLINICAL DATA: Respiratory stress

PORTABLE CHEST - 1 VIEW

[AP]
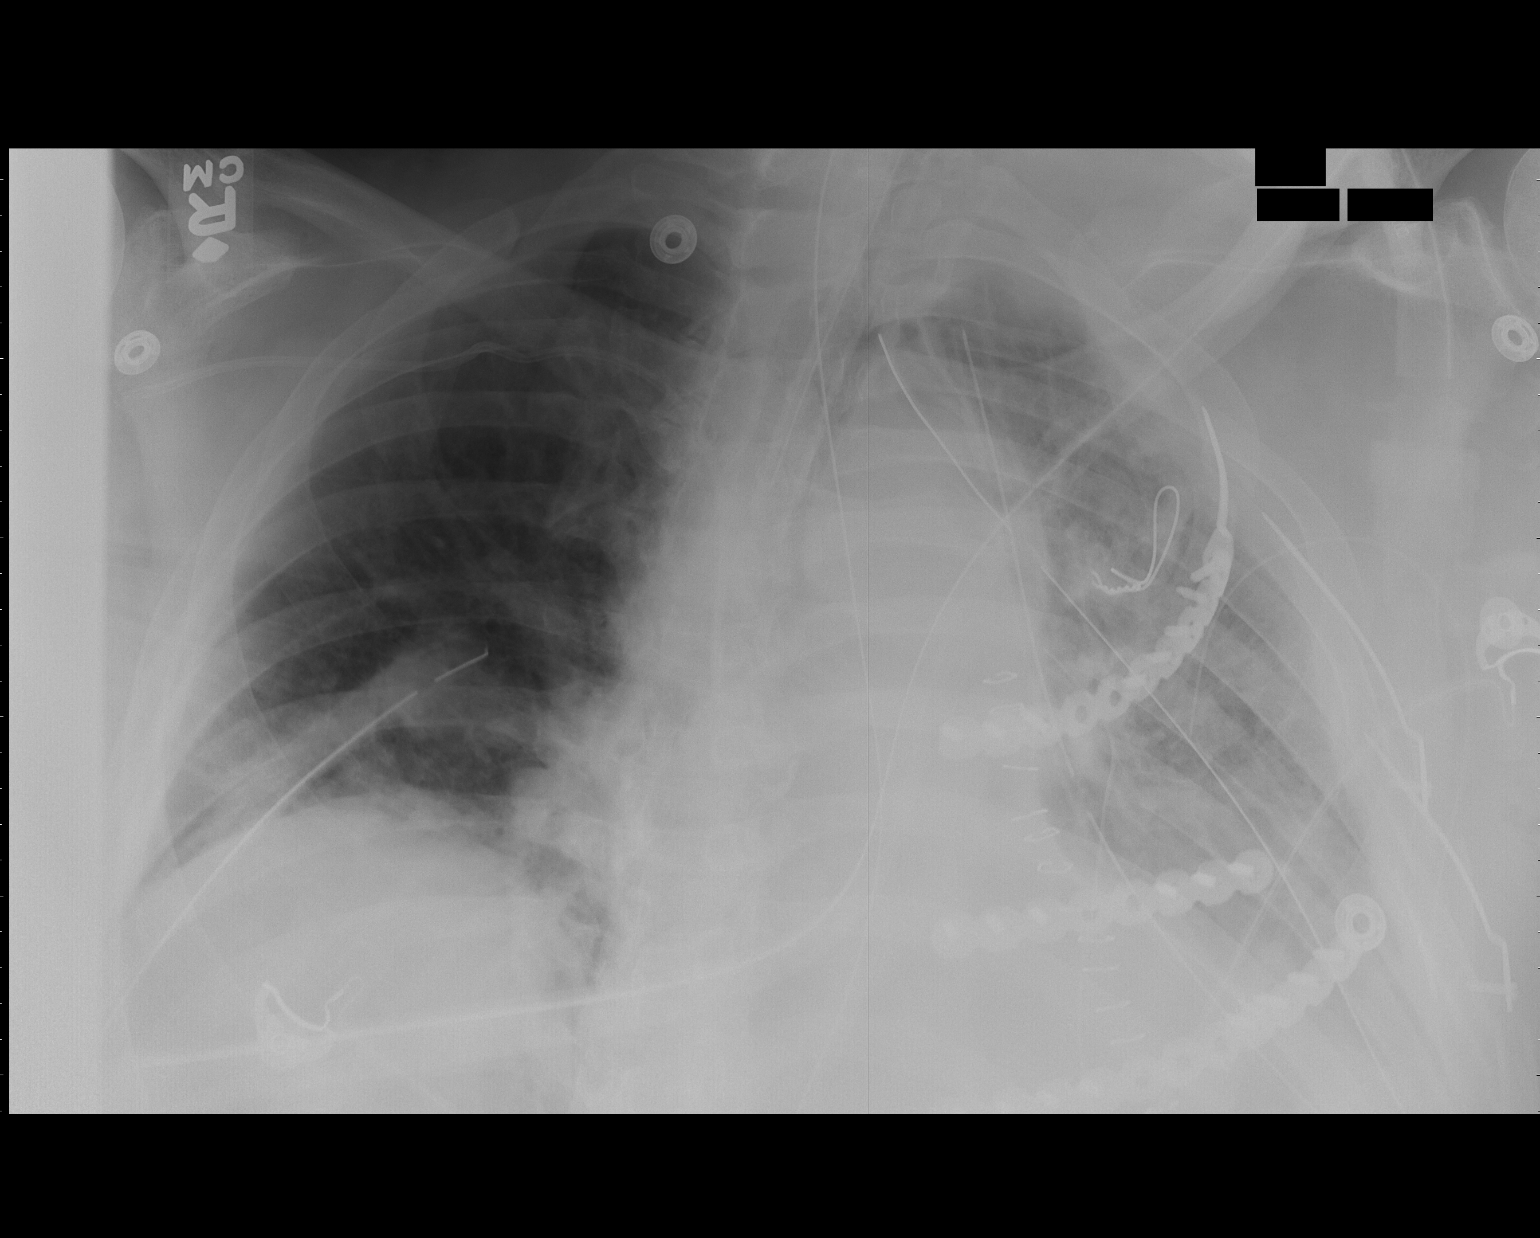

[1 of 1 positions shown; findings below may reference images not displayed]

FINDINGS: Endotracheal tube, three left chest tubes and one right
chest tube remain unchanged.  No clear evidence pneumothorax.  Low
lung volumes and perihilar opacities are unchanged from prior.
Multiple rib fixations.
IMPRESSION: 1.  Stable support apparatus.
2.  No significant change in low lung volumes and asymmetric air
space disease.
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
## 2008-12-21 ENCOUNTER — Encounter
Admission: RE | Admit: 2008-12-21 | Discharge: 2009-03-21 | Payer: Self-pay | Admitting: Physical Medicine & Rehabilitation
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
## 2008-12-26 IMAGING — CR DG CHEST 1V PORT
1 series · 1 of 1 positions shown · non-contrast
Comparison: 06/27/2008

CLINICAL DATA: Respiratory distress

PORTABLE CHEST - 1 VIEW

[view not recorded]
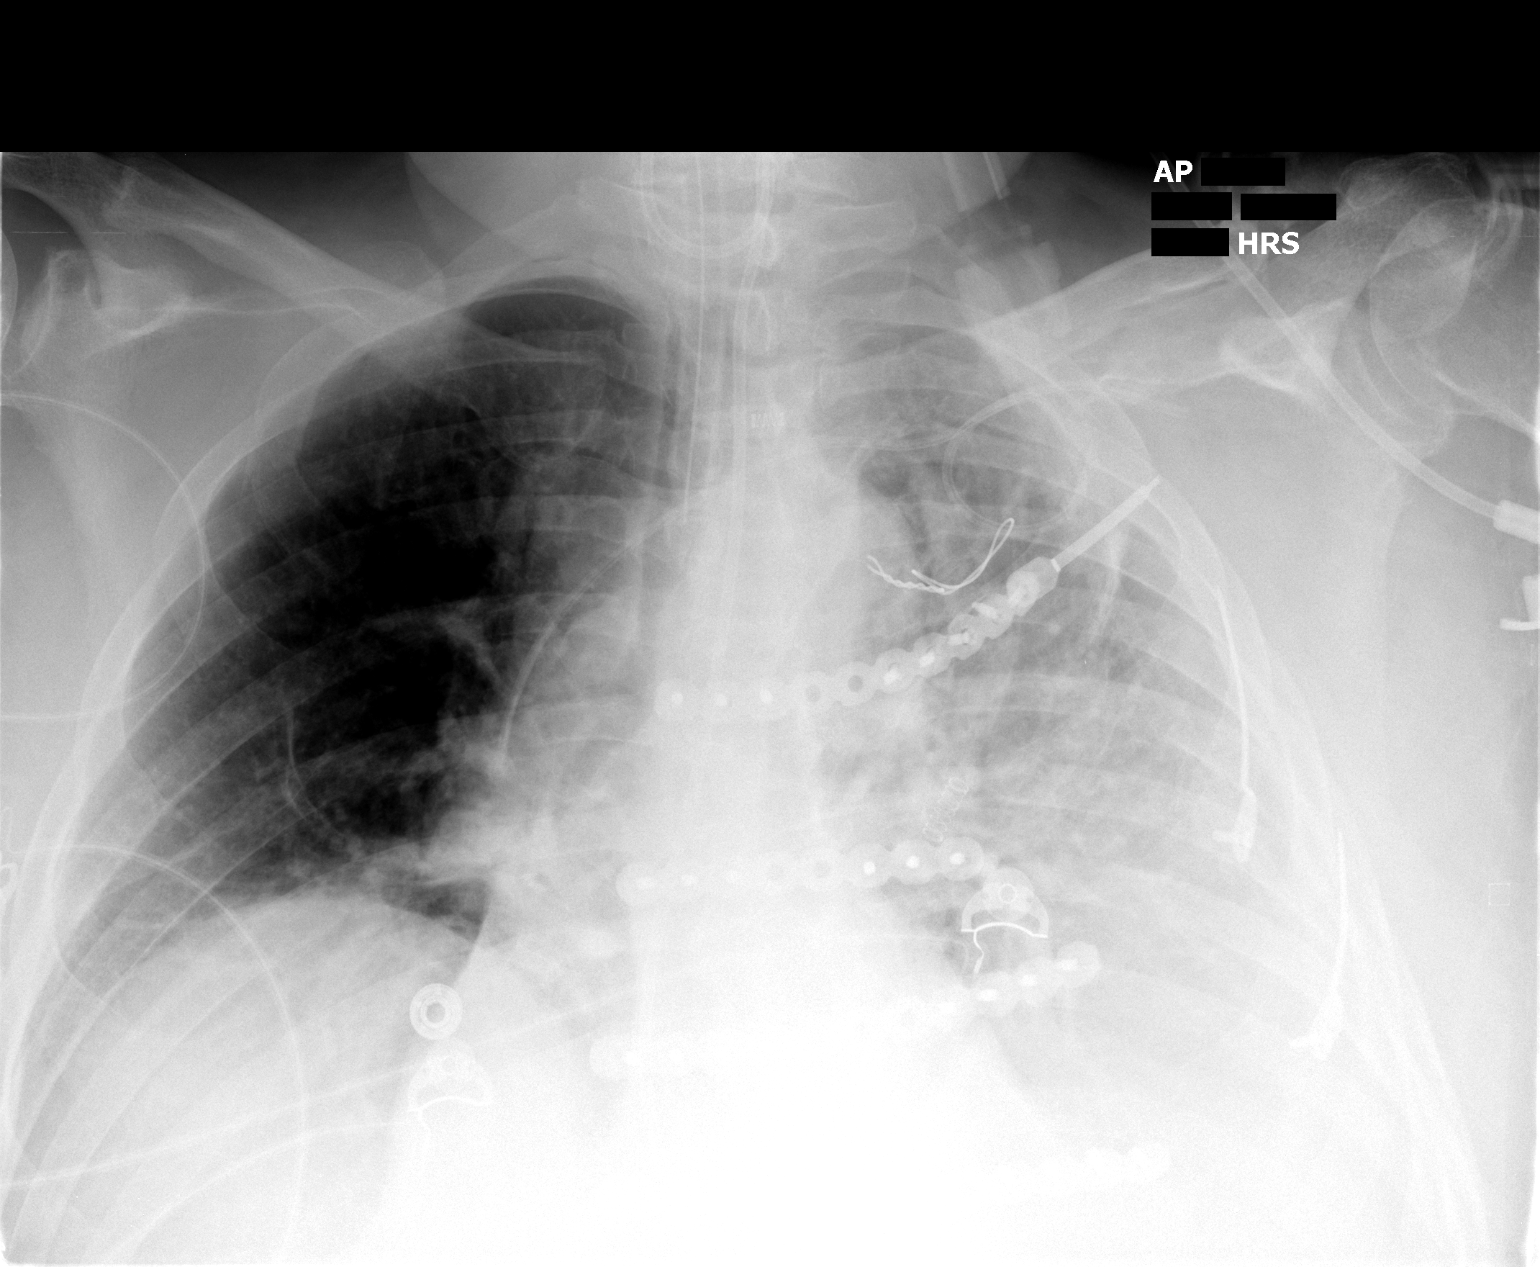

[1 of 1 positions shown; findings below may reference images not displayed]

FINDINGS: Airspace opacities throughout the left lung and left
pleural fluid are stable.  No pneumothorax.  The endotracheal tube,
feeding tube, and left subclavian center venous catheter are
stable.  Rib fixation hardware on the left is stable.
IMPRESSION: No significant interval change.  Stable left pulmonary opacities
and pleural effusion.
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
## 2009-01-03 IMAGING — CR DG CHEST 1V PORT
1 series · 1 of 1 positions shown · non-contrast
Comparison: The chest x-ray of 07/06/2008

CLINICAL DATA: Central line placed

PORTABLE CHEST - 1 VIEW

[AP]
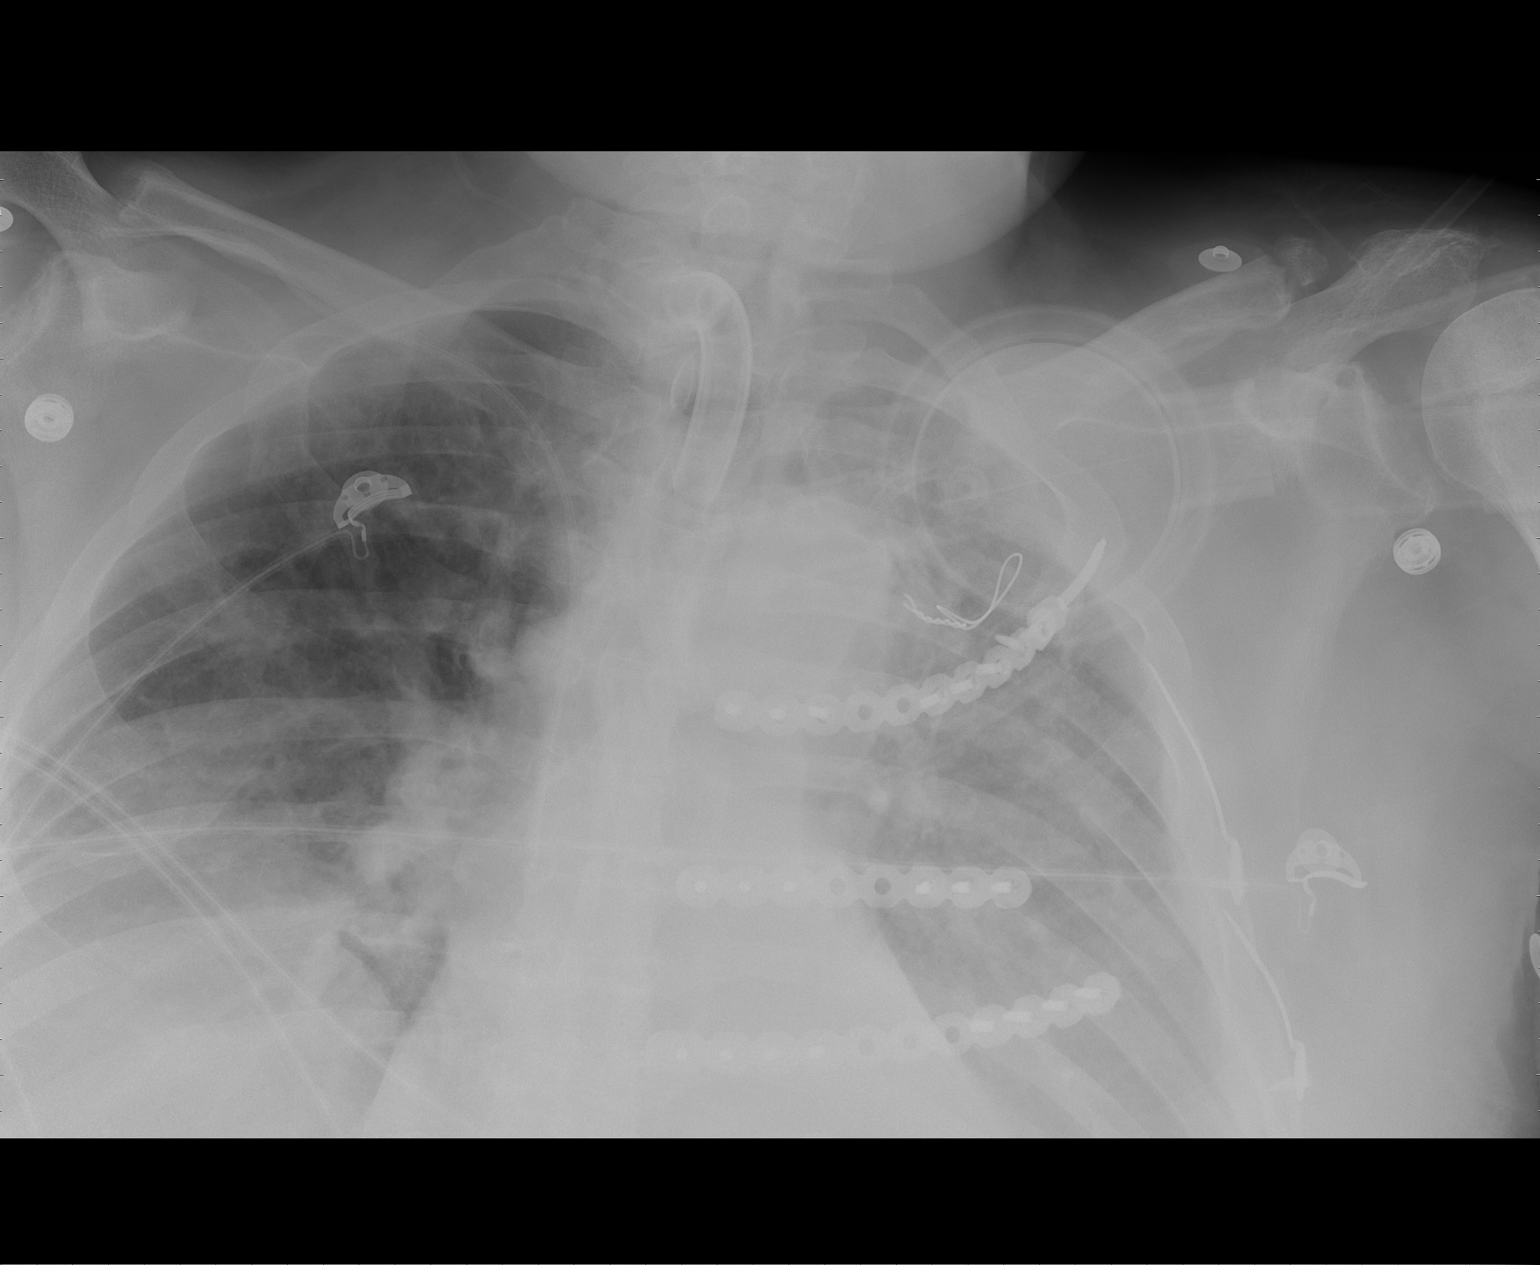

[1 of 1 positions shown; findings below may reference images not displayed]

FINDINGS: The right central venous line tip is now in the low SVC.
No pneumothorax is seen.  Left central venous line appears to have
been removed.  Tracheostomy is stable.  Bilateral airspace disease
and probable effusions remain.  Left rib reconstructions are again
noted
IMPRESSION: Right central venous catheter tip in low SVC.  No pneumothorax.
Left central venous catheter has been removed.
2.  No change in airspace disease and probable effusions.
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
## 2009-01-04 IMAGING — CR DG ELBOW 2V*L*
2 series · 2 of 2 positions shown · non-contrast
Comparison: Left elbow films of 06/24/2008 and

CLINICAL DATA: Cough, follow-up

LEFT ELBOW - 2 VIEW

[AP]
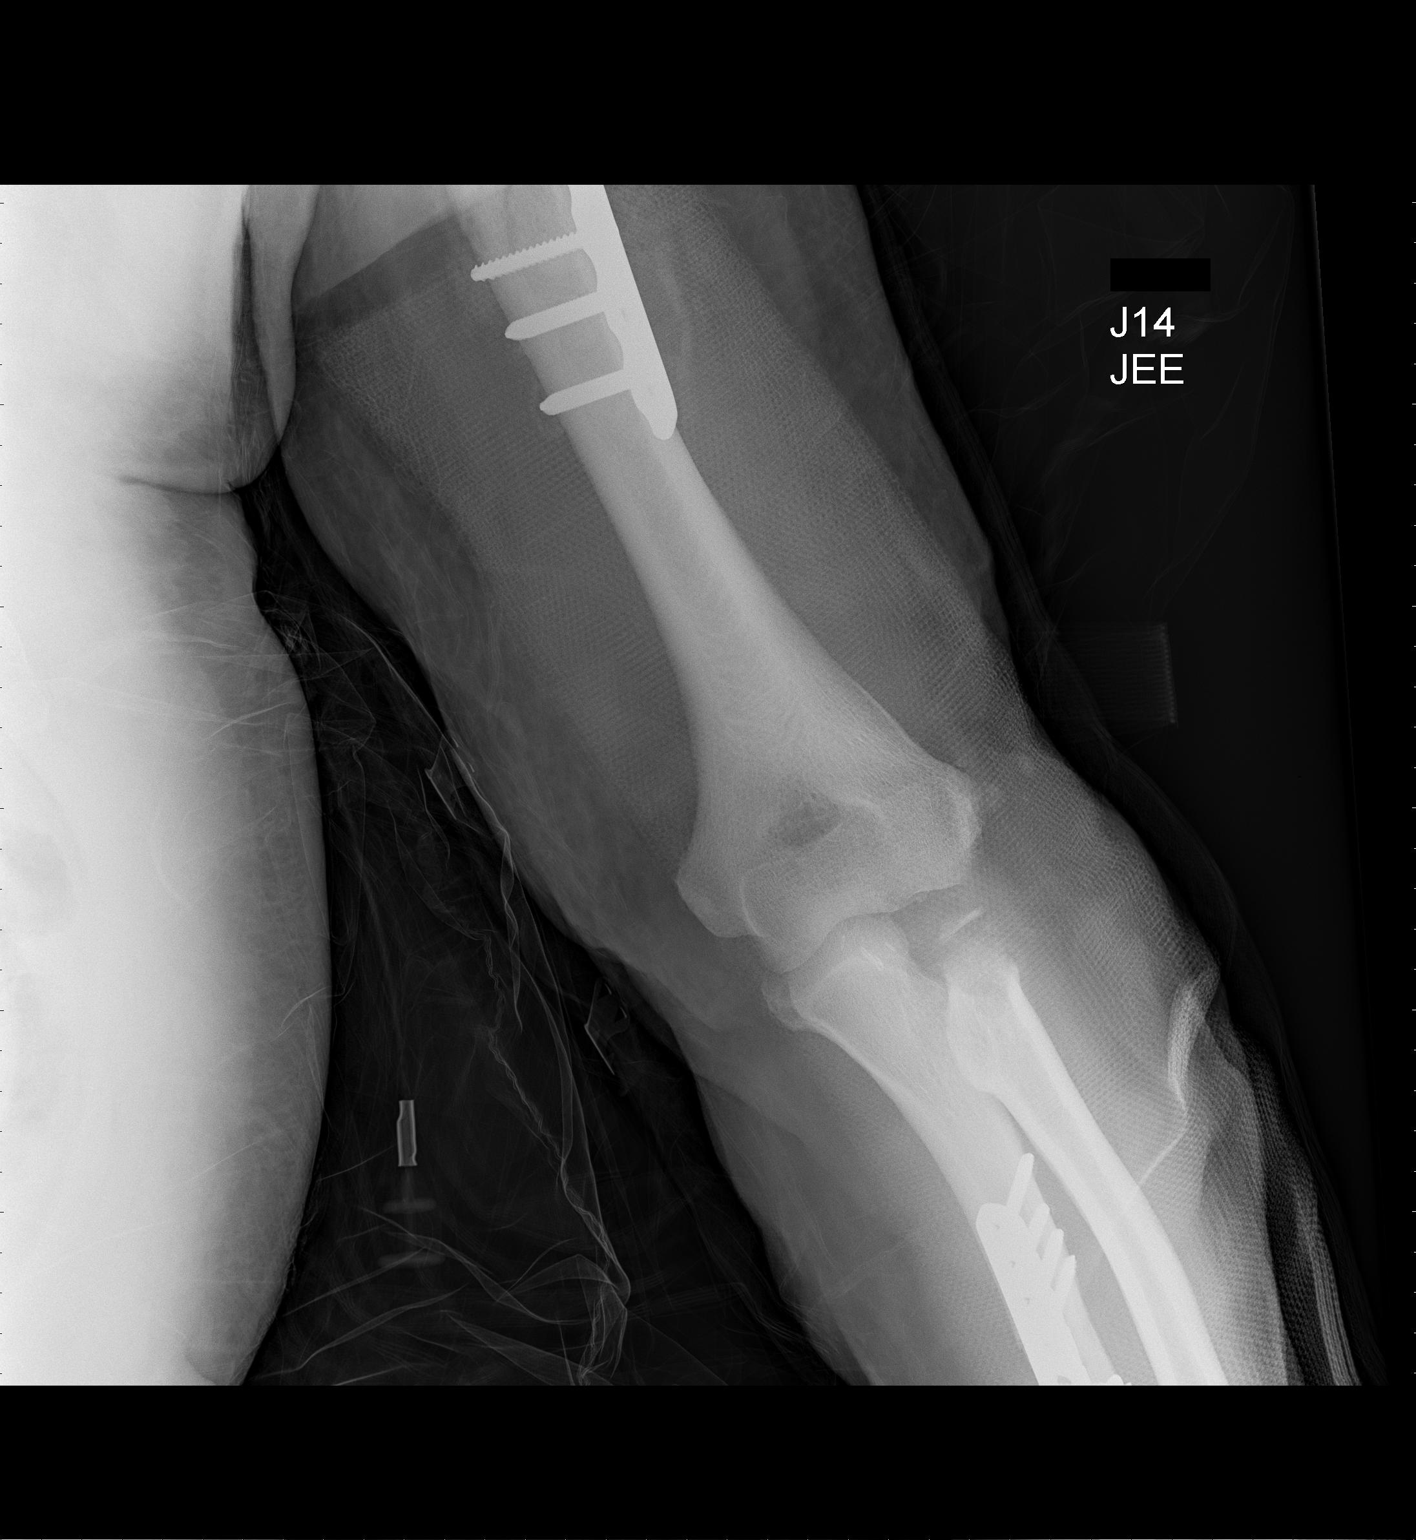

[lat elbow]
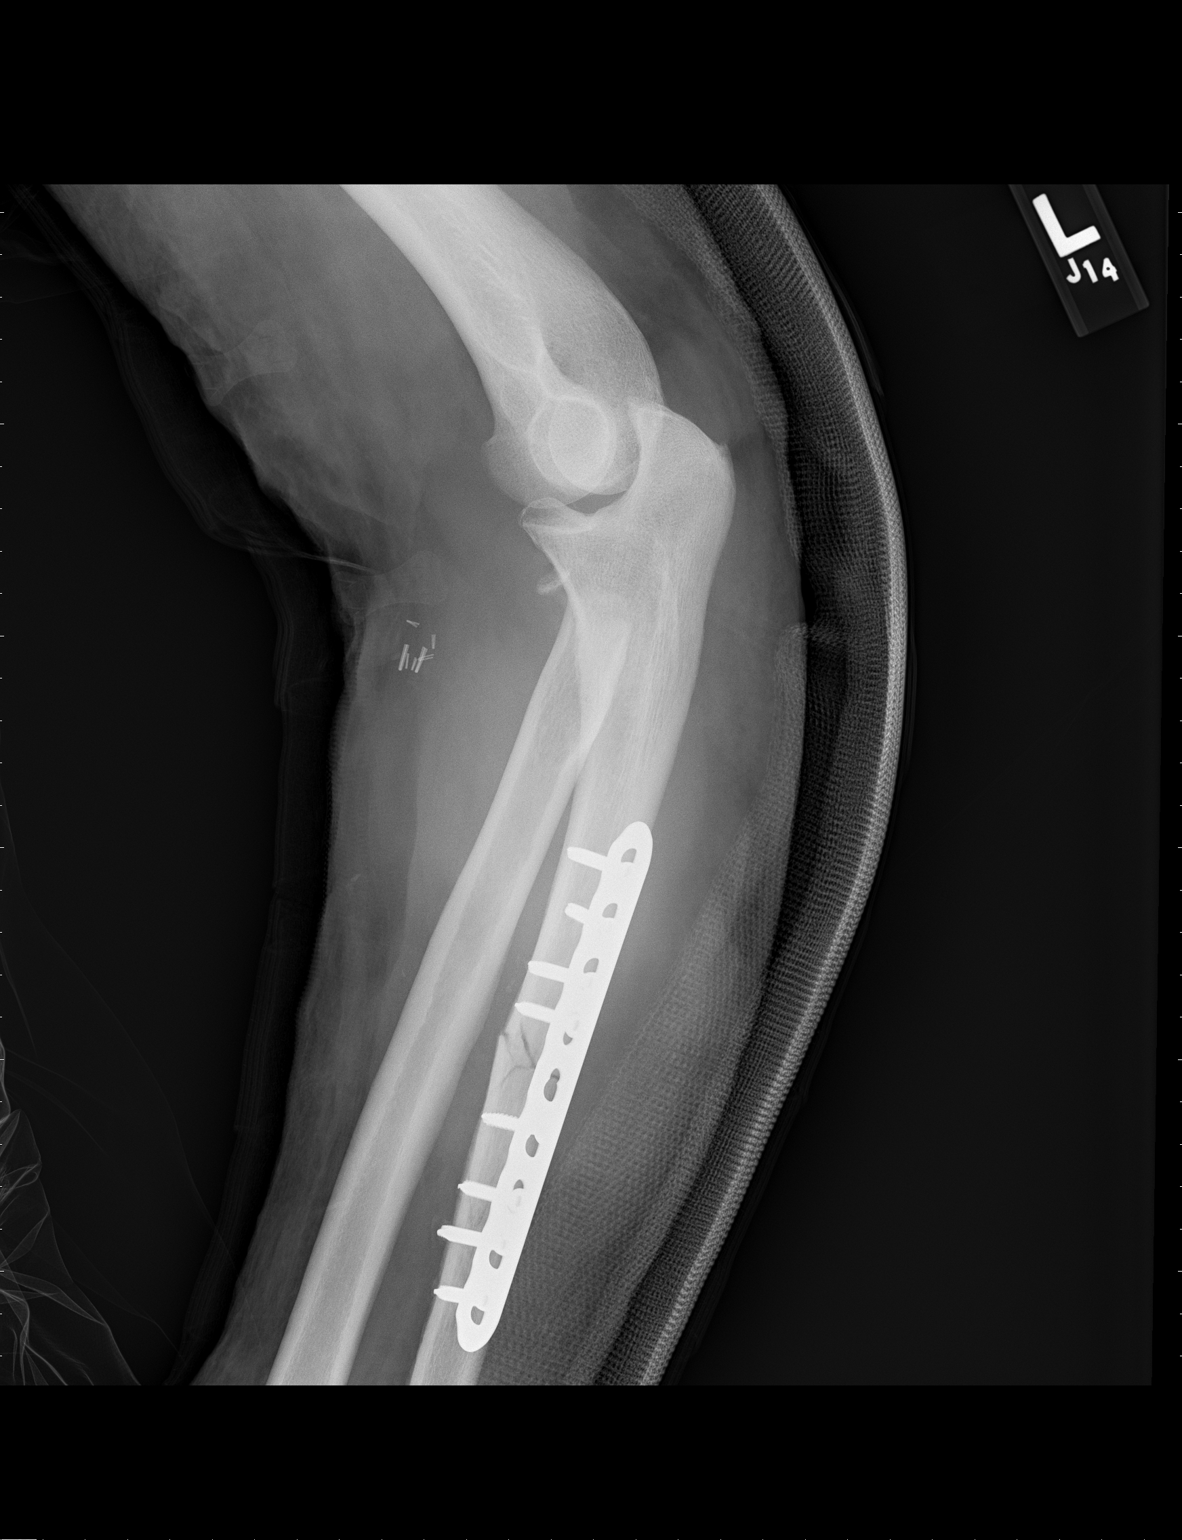

[2 of 2 positions shown; findings below may reference images not displayed]

FINDINGS: Fracture of the left radial  head is again noted. Normal
alignment is maintained.  Plate and screw fixation of the mid left
ulnar fracture appears stable.
IMPRESSION: Stable fixation of mid left ulnar fracture.  Left radial head
fracture again noted.
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
## 2009-01-05 IMAGING — CR DG ABD PORTABLE 1V
1 series · 1 of 1 positions shown · non-contrast
Comparison: None

CLINICAL DATA: Abdominal distension

ABDOMEN - 1 VIEW

[view not recorded]
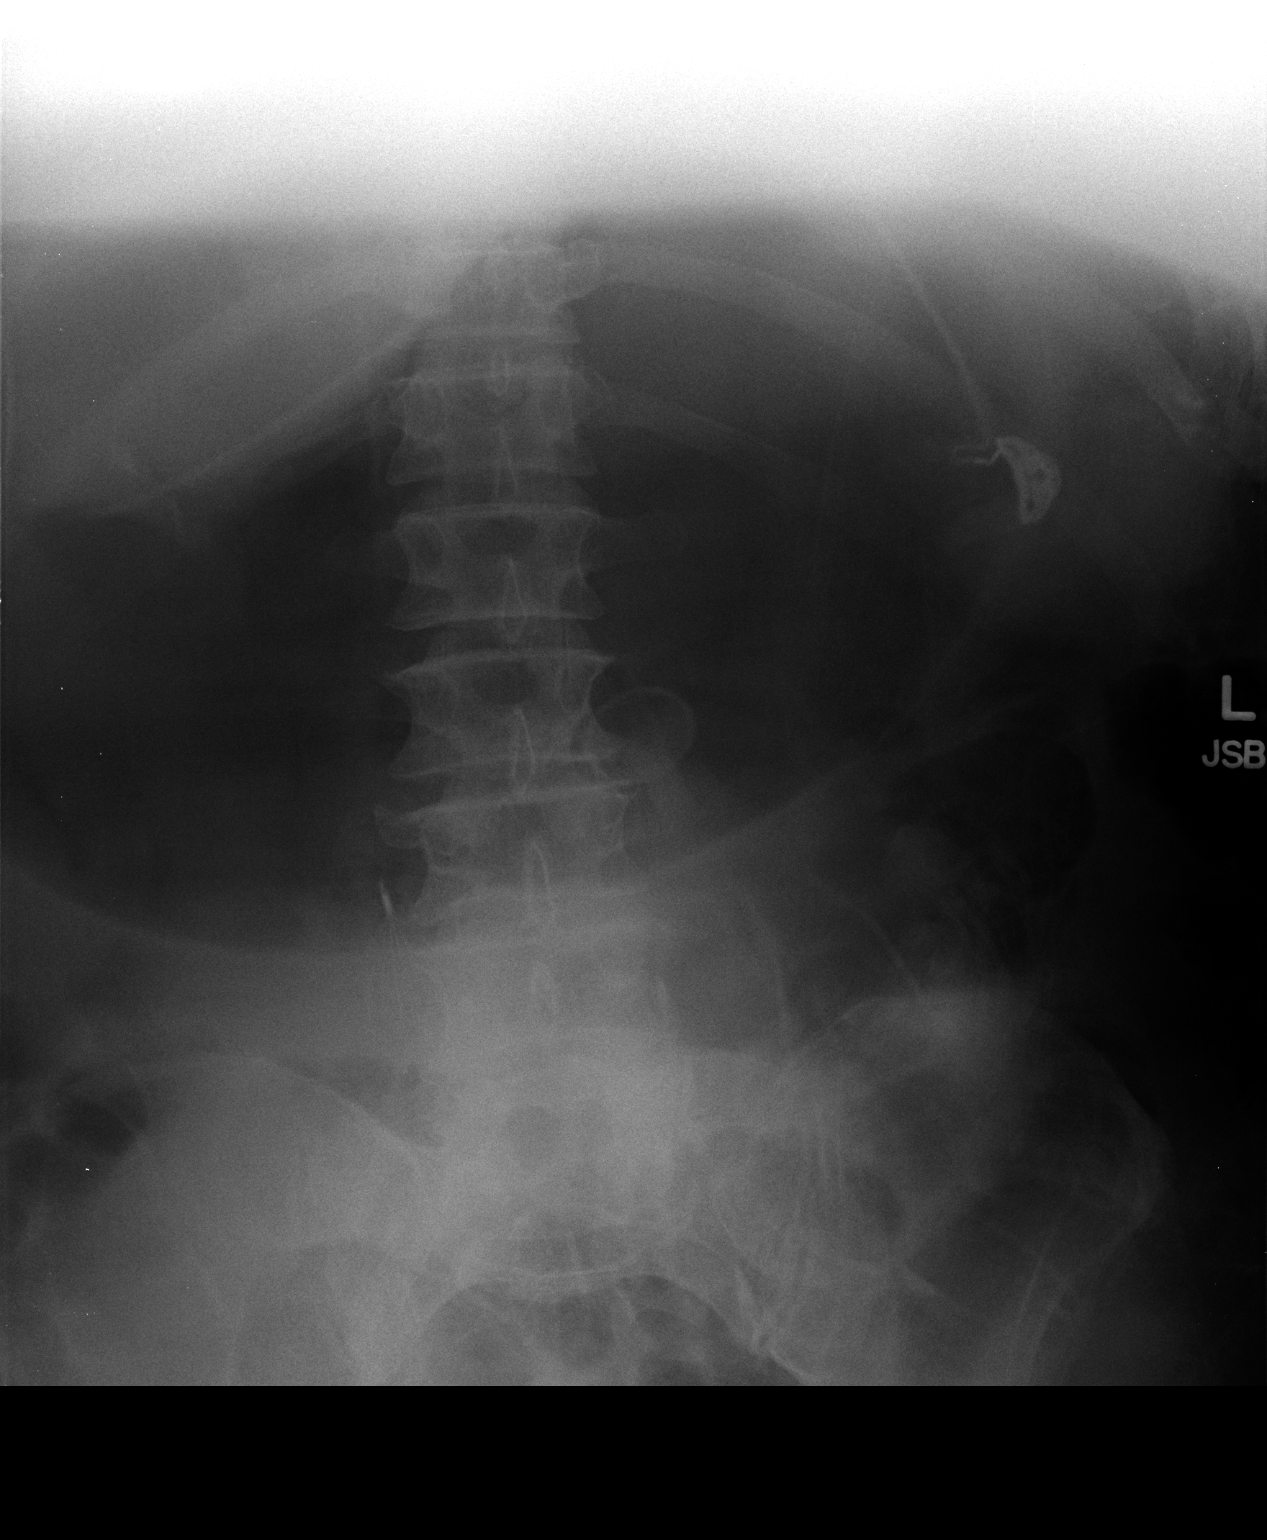

[1 of 1 positions shown; findings below may reference images not displayed]

FINDINGS: There is gaseous distension of the stomach and small
bowel.  Small bowel obstruction is possible.  There appears to be a
feeding gastrostomy tube in place.  No definite free air with the
examination is quite limited.  An IVC filter is noted.
IMPRESSION: 1.  Limited examination.
2.  Gaseous distension of the stomach and to a lesser extent the
small bowel.  Small bowel obstruction is possible.
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
## 2009-01-09 IMAGING — CR DG CHEST 1V PORT
1 series · 1 of 1 positions shown · non-contrast
Comparison: Yesterday.

CLINICAL DATA: Respiratory distress.

PORTABLE CHEST - 1 VIEW

[AP]
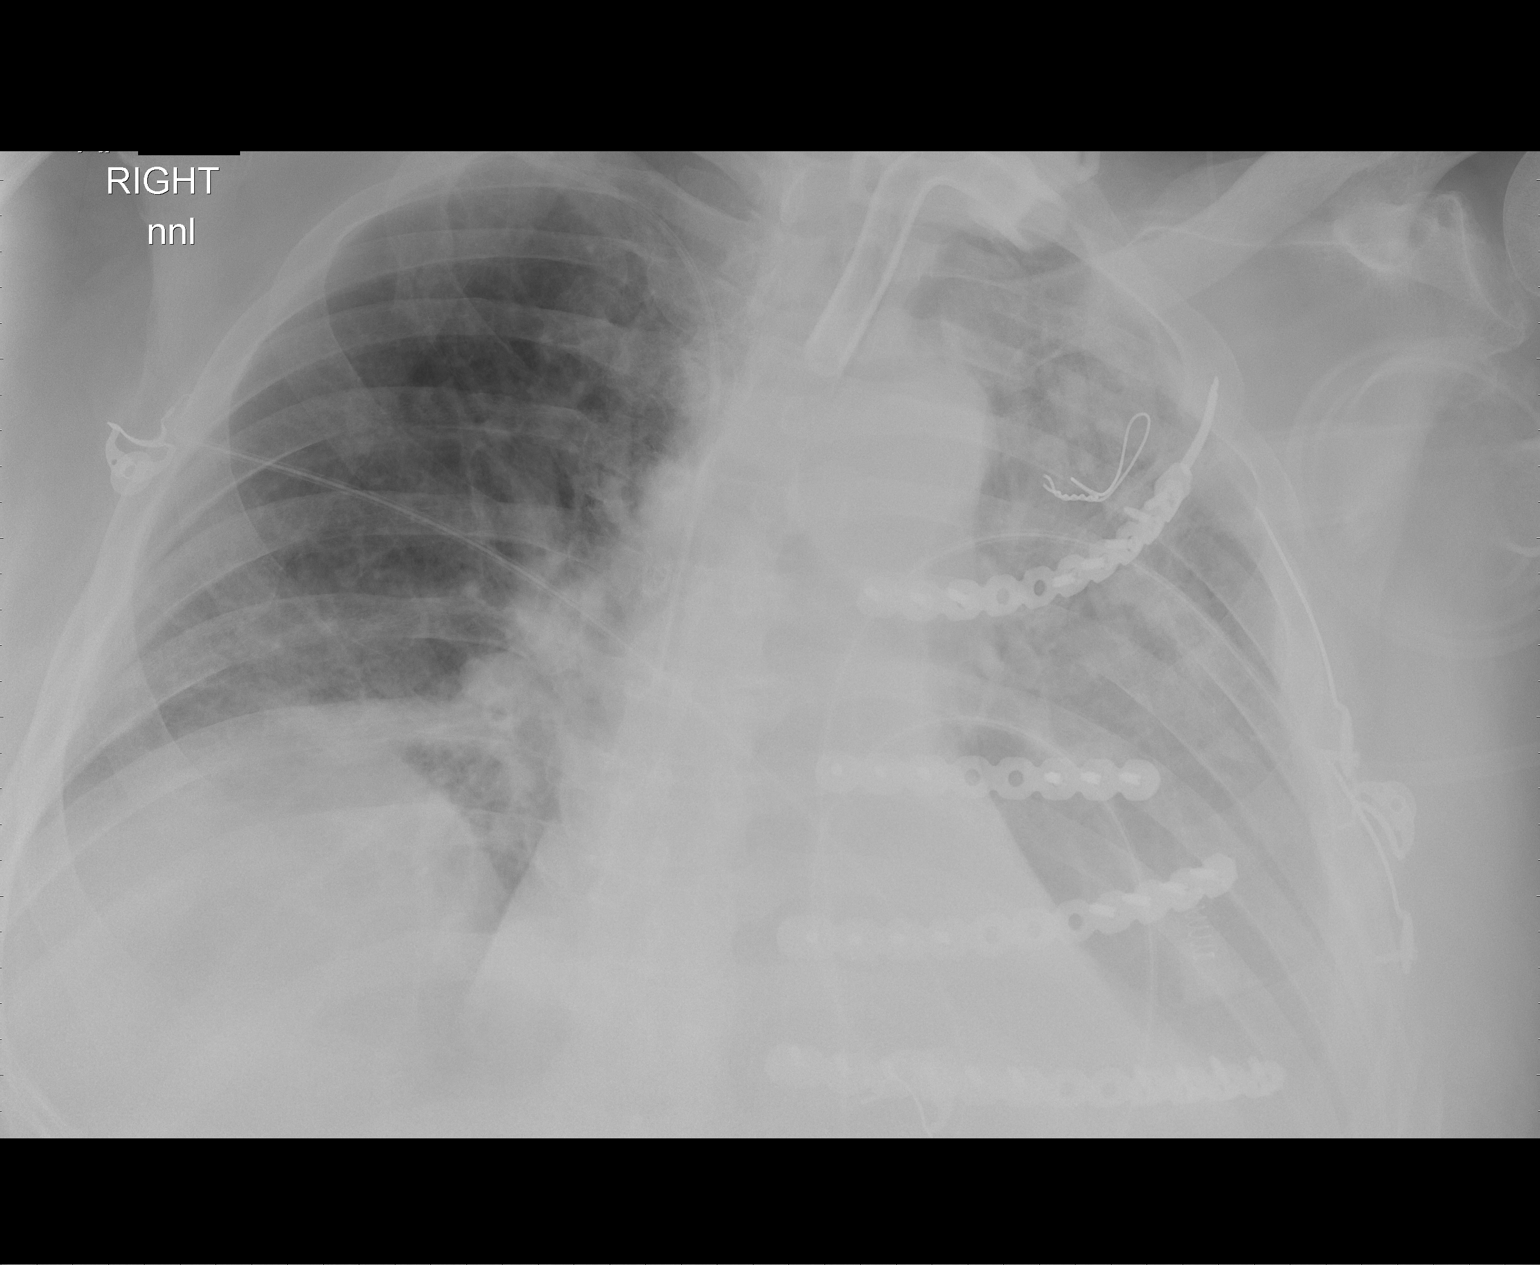

[1 of 1 positions shown; findings below may reference images not displayed]

FINDINGS: The tracheostomy tube remains in satisfactory position.
Stable right subclavian catheter and left rib fractures.  No
significant overall change in left lung airspace opacity and
minimal right basilar opacity.  Probable left pleural fluid.
Mildly enlarged cardiac silhouette.
IMPRESSION: 1.  No significant change in bilateral airspace opacity.
2.  Stable cardiomegaly and displaced left rib fractures.
3.  Probable left pleural effusion.
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
## 2009-01-21 IMAGING — CR DG CHEST 1V PORT
1 series · 1 of 1 positions shown · non-contrast
Comparison: 07/22/2008

CLINICAL DATA: Status post thoracotomy

PORTABLE CHEST - 1 VIEW

[view not recorded]
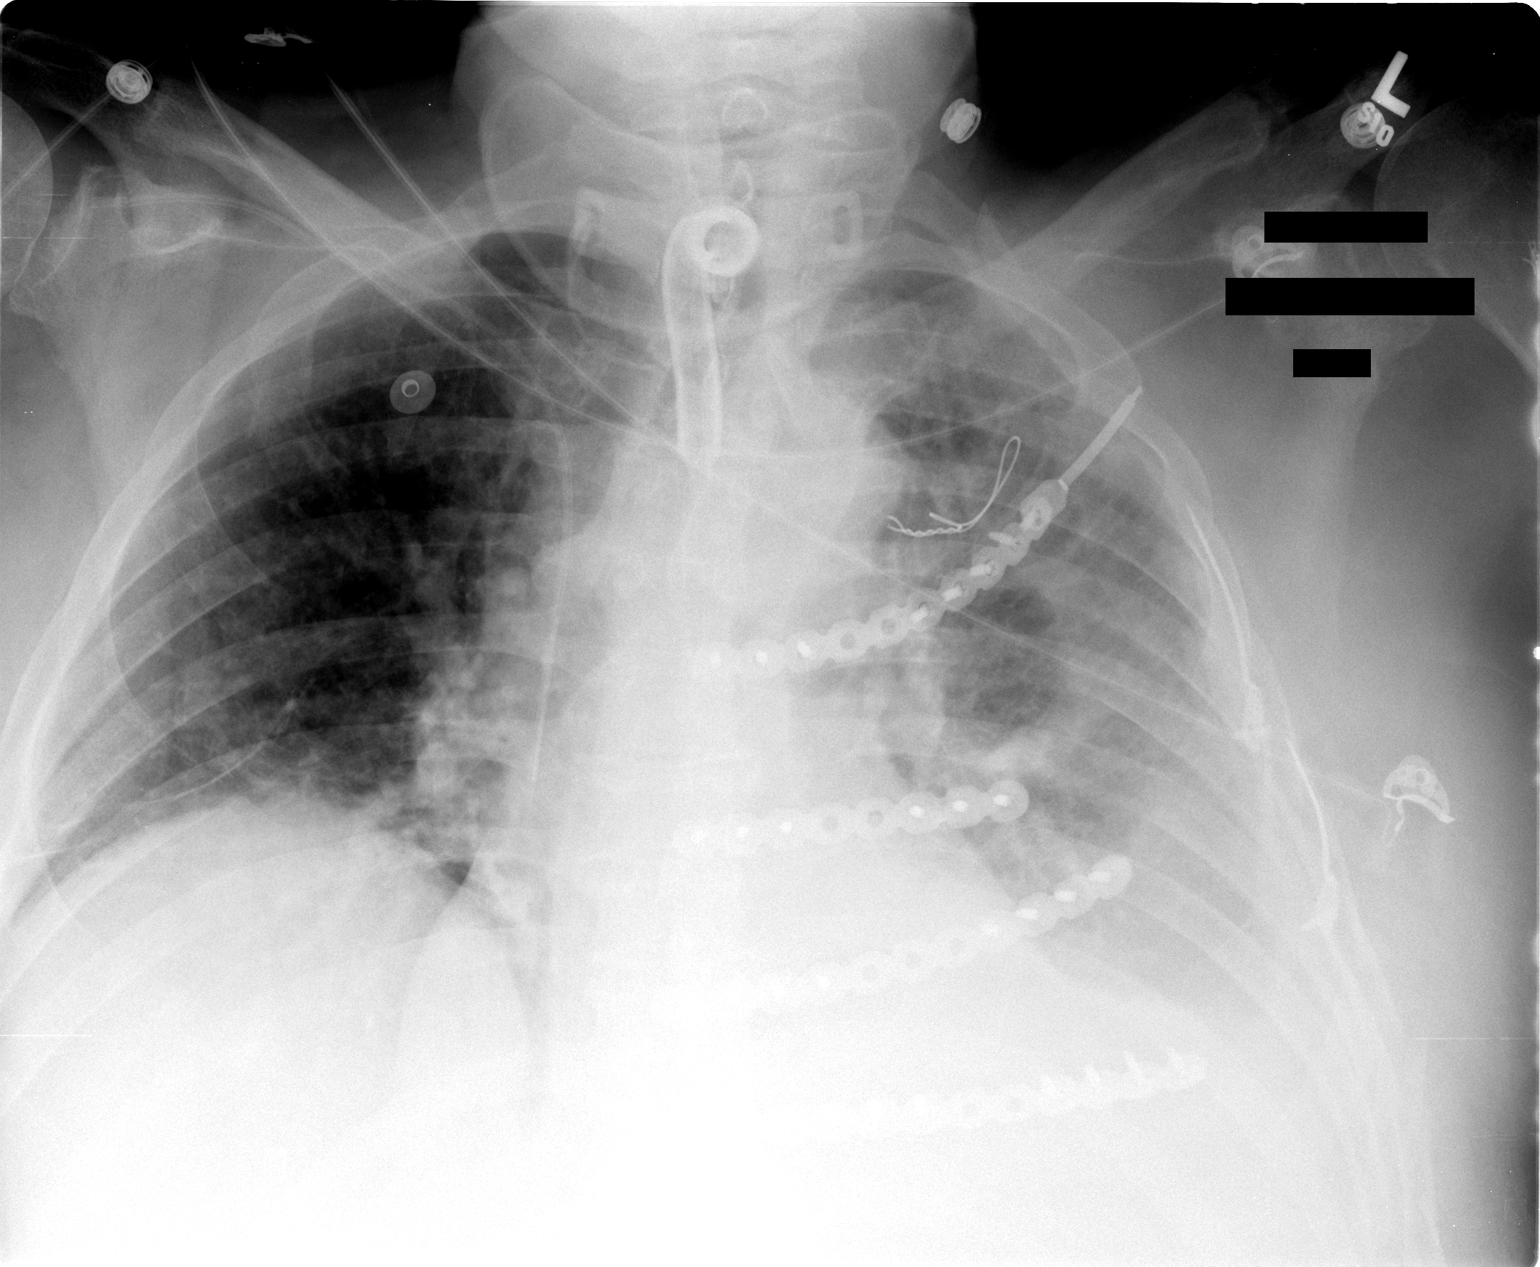

[1 of 1 positions shown; findings below may reference images not displayed]

FINDINGS: Stable surgical changes.  The tracheostomy tube is
stable.  The right subclavian center venous catheters unchanged.

Persistent low lung volumes with vascular crowding, vascular
congestion/edema, atelectasis and probable left effusion.
IMPRESSION: 1.  Stable support apparatus.
2.  Persistent edema, atelectasis and effusions.
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
## 2009-01-25 IMAGING — CR DG HAND 2V*R*
2 series · 2 of 2 positions shown · non-contrast
Comparison: 07/07/2008

CLINICAL DATA: Follow-up fractures.

RIGHT HAND - 2 VIEW

[PA]
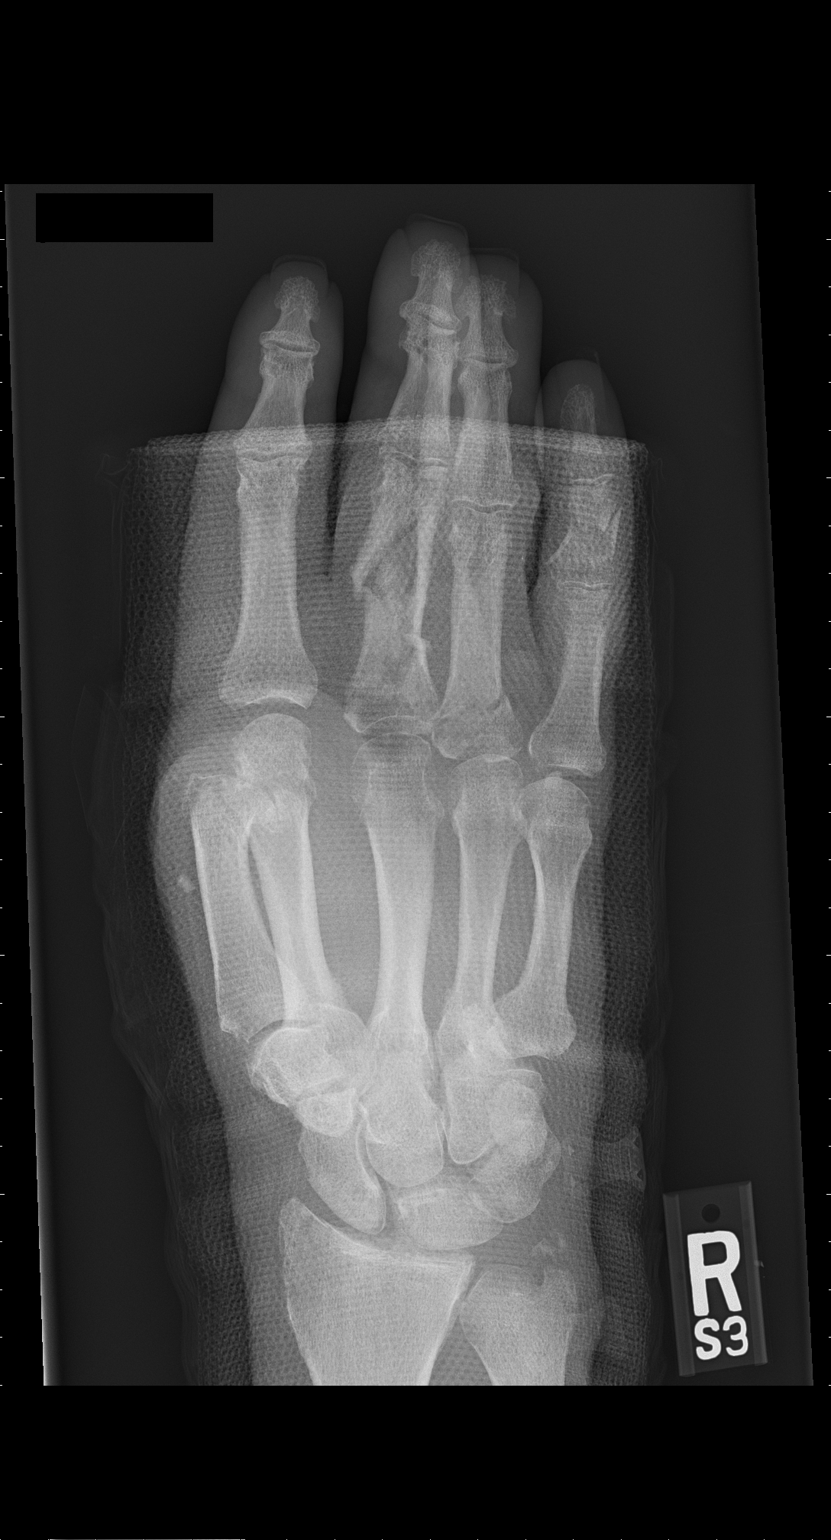

[lat hand]
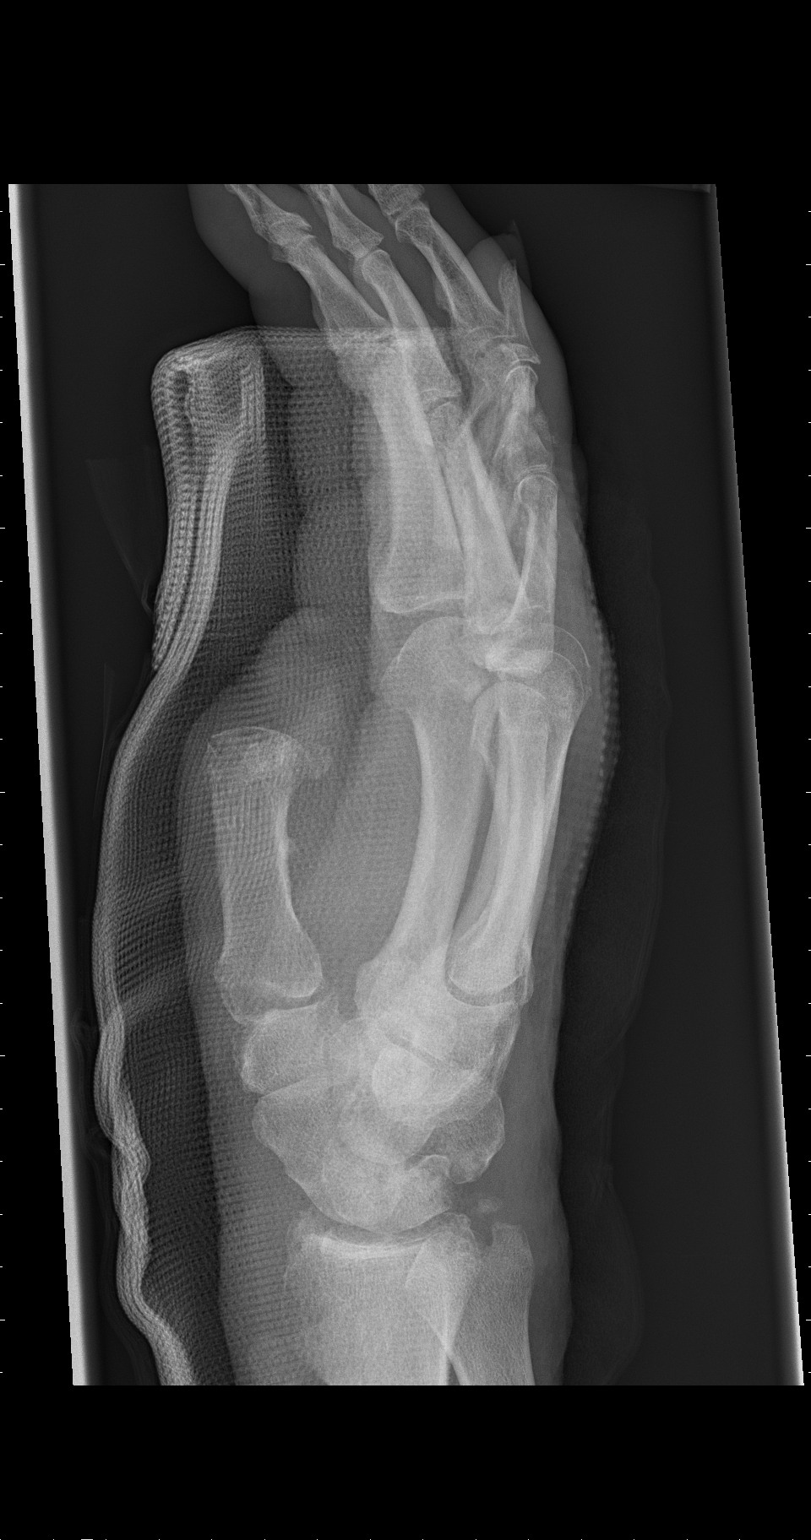

[2 of 2 positions shown; findings below may reference images not displayed]

FINDINGS: There is been interval removal of the K-wires from the
fractures involving the middle and ring finger proximal phalanges
and middle phalanx of the little finger.
These fractures appear relatively unchanged except for mild
interval healing.
Amputation of the thumb is again noted.
Ulnar styloid and distal radial fractures are again noted.
No other changes are identified.
IMPRESSION: Interval removal of fixation wires from finger fractures  with
slight interval healing, otherwise stable examination.

## 2009-01-25 IMAGING — CR DG FOREARM 2V*L*
2 series · 2 of 2 positions shown · non-contrast
Comparison: 07/07/2008

CLINICAL DATA: Follow-up left forearm fracture.

LEFT FOREARM - 2 VIEW

[AP]
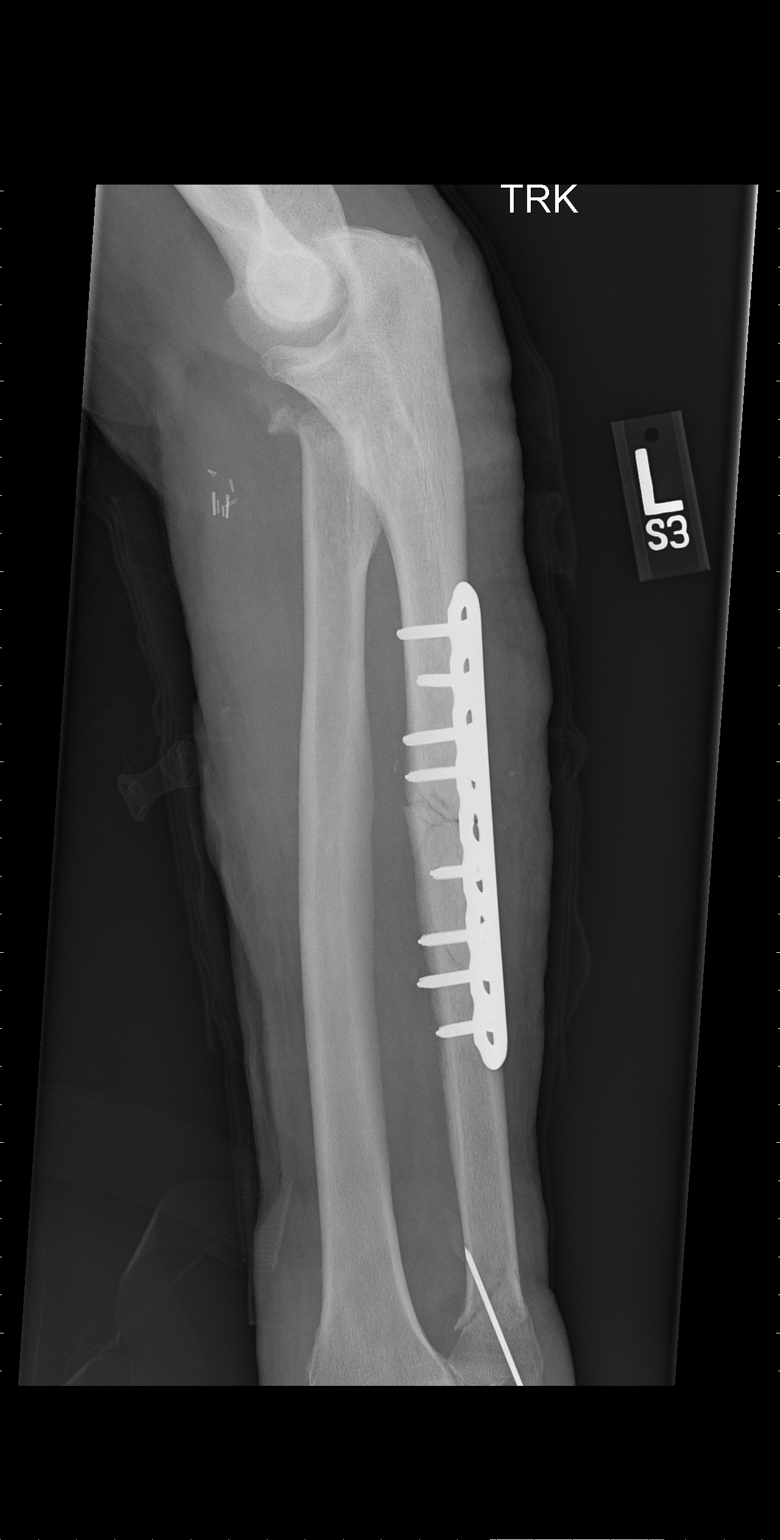

[forearm lat]
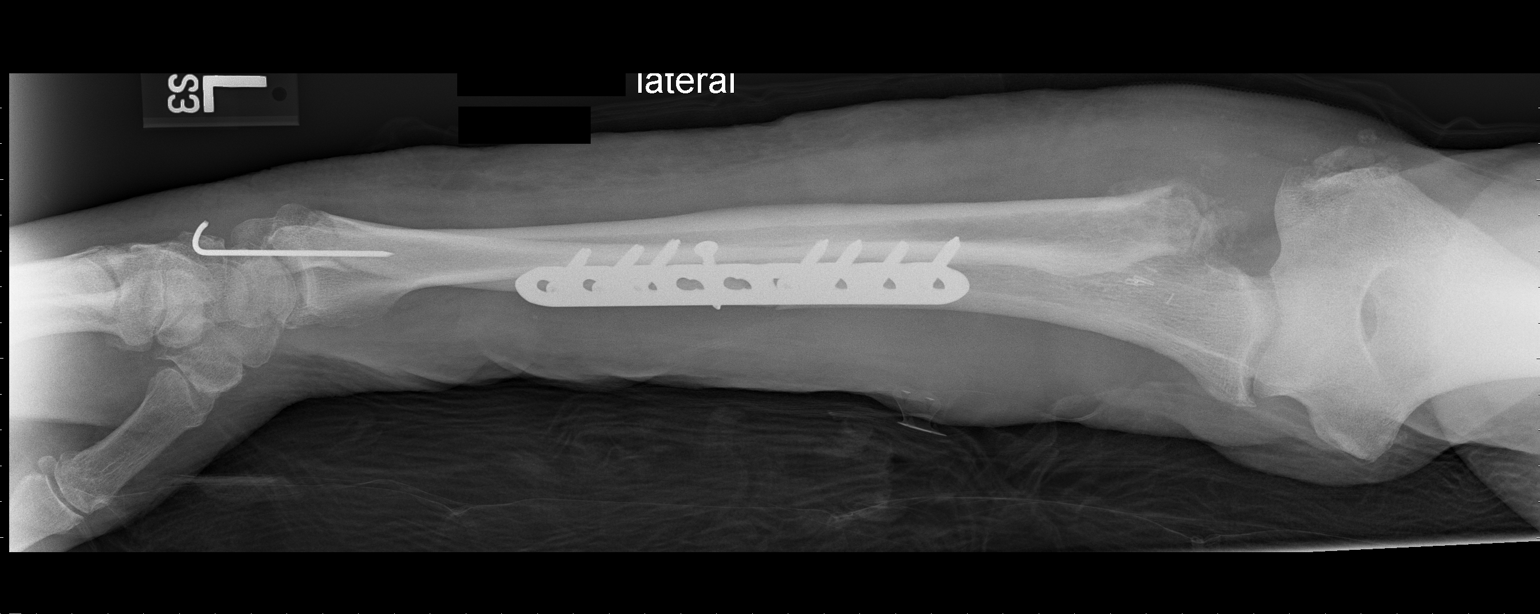

[2 of 2 positions shown; findings below may reference images not displayed]

FINDINGS: There is been no interval change in the mid or distal
ulnar fractures and internal fixation hardware.
Soft tissue swelling overlying these fractures has decreased since
the prior study.
Irregularity/fracture of the radial head is unchanged.
There is been little interval change otherwise since the prior
study.
IMPRESSION: Stable fractures and internal hardware as described.

Decreased soft tissue swelling.

## 2009-02-01 ENCOUNTER — Encounter: Admission: RE | Admit: 2009-02-01 | Discharge: 2009-02-01 | Payer: Self-pay | Admitting: Thoracic Surgery

## 2009-02-01 ENCOUNTER — Ambulatory Visit: Payer: Self-pay | Admitting: Thoracic Surgery
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
## 2009-02-08 ENCOUNTER — Encounter: Admission: RE | Admit: 2009-02-08 | Discharge: 2009-05-09 | Payer: Self-pay | Admitting: Orthopedic Surgery
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
## 2009-02-24 ENCOUNTER — Ambulatory Visit: Payer: Self-pay | Admitting: Thoracic Surgery
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
## 2009-04-29 ENCOUNTER — Encounter: Admission: RE | Admit: 2009-04-29 | Discharge: 2009-07-28 | Payer: Self-pay | Admitting: Orthopedic Surgery

## 2009-05-12 ENCOUNTER — Encounter: Admission: RE | Admit: 2009-05-12 | Discharge: 2009-05-19 | Payer: Self-pay | Admitting: Orthopedic Surgery

## 2009-05-19 ENCOUNTER — Encounter: Admission: RE | Admit: 2009-05-19 | Discharge: 2009-05-19 | Payer: Self-pay | Admitting: Thoracic Surgery

## 2009-05-19 ENCOUNTER — Ambulatory Visit: Payer: Self-pay | Admitting: Thoracic Surgery
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
## 2009-07-29 ENCOUNTER — Encounter: Admission: RE | Admit: 2009-07-29 | Discharge: 2009-09-16 | Payer: Self-pay | Admitting: Orthopedic Surgery
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
## 2009-11-30 ENCOUNTER — Encounter: Admission: RE | Admit: 2009-11-30 | Discharge: 2009-11-30 | Payer: Self-pay | Admitting: Thoracic Surgery

## 2009-11-30 ENCOUNTER — Ambulatory Visit: Payer: Self-pay | Admitting: Thoracic Surgery
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
## 2010-09-13 ENCOUNTER — Encounter: Admission: RE | Admit: 2010-09-13 | Discharge: 2010-09-13 | Payer: Self-pay | Admitting: Thoracic Surgery

## 2010-09-21 ENCOUNTER — Ambulatory Visit: Payer: Self-pay | Admitting: Thoracic Surgery

## 2011-01-07 ENCOUNTER — Encounter: Payer: Self-pay | Admitting: Thoracic Surgery
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
## 2011-05-02 NOTE — Op Note (Signed)
NAME:  [name], [name] NO.:  [id number]   MEDICAL RECORD NO.:  [id number]          PATIENT TYPE:  INP   LOCATION:  2112                         FACILITY:  [facility]   PHYSICIAN:  Dionne Ano. Gramig III, M.D.DATE OF BIRTH:  [birth year]   DATE OF PROCEDURE:  DATE OF DISCHARGE:                               OPERATIVE REPORT   PREOPERATIVE DIAGNOSES:  1. Status post right thumb amputation with associated degloving injury      dorsally and middle finger, ring finger proximal phalanx fracture      as well as small finger middle phalanx fracture.  This patient      presents for incision and drainage and skin coverage of the dorsal      degloving wound.  2. Status post major left upper extremity reconstruction including      skin grafting.   POSTOPERATIVE DIAGNOSES:  1. Status post right thumb amputation with associated degloving injury      dorsally and middle finger, ring finger proximal phalanx fracture      as well as small finger middle phalanx fracture.  This patient      presents for incision and drainage and skin coverage of the dorsal      degloving wound.  2. Status post major left upper extremity reconstruction including      skin grafting.   PROCEDURE:  1. Incision and drainage, dorsal hand, right upper extremity, skin and      subcutaneous tissue.  2. Split-thickness skin graft with 1-1.5 meshing about 8 x 5 inch area      dorsally.  3. Incision and drainage, skin partial thickness, left arm.   SURGEON:  Dionne Ano. Amanda Pea, MD   ASSISTANT:  Karie Chimera, PA-C   COMPLICATIONS:  None.   ANESTHESIA:  General.   TOURNIQUET TIME:  Zero.   INDICATIONS FOR THE PROCEDURE:  [name] has been treated for the  dorsal degloving wound about the right upper extremity.  He has  fractures, which are now 5 weeks out.  He has fairly good skin coverage,  no signs of infection, and at present time given all issues I would like  to provide him with coverage as best as  possible to the dorsal hand.  A  reconstructive surgery certainly may need to be required into the  future, but given his overall status we want to go ahead and get these  wounds covered and stable.  The patient and I have discussed this.  I  have also discussed this with his family.   OPERATION IN DETAIL:  The patient was taken to the operative suite,  underwent smooth induction of general anesthesia, and following this he  was prepped and draped in usual sterile fashion about the right thigh  and right upper extremity.  Once this was done, I&D of the right upper  extremity, skin and subcutaneous tissue was accomplished with 3 liters  of saline.  Once this was done, I then took a split-thickness skin  graft.  This was a 2-inch width area.  With the dermatome, I then  prepared this  and meshed it, followed by placement, and insetting the  graft with chromic suture.  The patient tolerated this well.  Following  this, I placed Mepitel and following this a VAC sponge over the area.  The VAC sponge was then hooked up to 75 mmHg.  The patient tolerated  this well with no complicating features.  Following this, I turned  attention towards the left upper extremity partial-thickness skin  debridement ensued and the patient tolerated this well.  He had some  breakdown over the tip of his ulna due to pressure phenomenon and we  will continue to keep a close eye on this and perform daily care to this  area.  I have discussed this with him and his family.   His fixation appears to be stable at present time.  He will need a  continued careful close observation.  I have discussed with him these  issues at length, the do's and don'ts, etc.   The patient was dressed sterilely.  The skin graft site on the right  thigh was dressed with Xeroform followed by ABD.  His left thigh was  fairly stable.  There were no signs of deep infection in the thigh,  where prior large split-thickness skin grafting has  been performed.  We  placed Neosporin and a dressing over this.   The patient will be monitored in the ICU, as he has been and continue  his previous orders.  All questions have been encouraged and answered.      Dionne Ano. Everlene Other, M.D.     [name]  D:  07/14/2008  T:  07/15/2008  Job:  [id number]

## 2011-05-02 NOTE — Assessment & Plan Note (Signed)
OFFICE VISIT   [name], Carl ECKERMAN  DOB:  1936/03/27                                        February 01, 2009  CHART #:  [id number]   The patient returned today complaining of left chest pain apparently  over the week or last 2-3 days, when he is rolled over in bed, he got a  very sharp pain around the anterior axillary line at the fifth to sixth  intercostal space.  This resolved, but it hurt more when he leaned over  and it did not hurt when he was lying flat or standing or sitting, but  only when he leaned over.  The pain was a very sharp pain.  No erythema,  no fever or chills.  His blood pressure was 115/72, pulse 76,  respirations 18, and sats were 96%.  Chest x-ray was obtained and the  area where he was hurting was possibly one area of one or two of the rib  splints, did not see any evidence of any migration of either stents or  any of the screws.  The place appeared to be well encapsulated.  I will  start him on Voltaren 75 mg take twice a day and I will see him back in  3 weeks.  If he continues to have pain, then we will repeat his CT scan  to look for possibly some question of a neuroma formation.   Ines Bloomer, M.D.  Electronically Signed   DPB/MEDQ  D:  02/01/2009  T:  02/01/2009  Job:  [id number]

## 2011-05-02 NOTE — Op Note (Signed)
NAME:  [place], GROSSER NO.:  [id number]   MEDICAL RECORD NO.:  [id number]          PATIENT TYPE:  IPS   LOCATION:  4004                         FACILITY:  [facility]   PHYSICIAN:  Dionne Ano. Gramig, M.D.DATE OF BIRTH:  [birth year]   DATE OF PROCEDURE:  08/29/2008  DATE OF DISCHARGE:                               OPERATIVE REPORT   The patient is status post major reconstruction of his upper extremity.  He is greater than 10 weeks out.  We are going to repeat x-rays today.  At the present time, he is doing quite well in terms of his stability.  His function is improving, and he is making excellent strides.   He still has a small area over the posterior aspect of his left arm  where there is a pressure ulcer.  We have reviewed this and following  this, performed I&D.   PROCEDURE:  The patient underwent debridement of skin, subcutaneous  tissue about the posterior region of his elbow.  He does not have  exposed bone.  At the present time, we are going to continue very  careful cautious approach.  We will continue wet-to-dry dressing changes  and Santyl topical treatments.   The patient tolerated the I&D without difficulty and was dressed nicely  afterwards.  This was an excisional debridement of skin and subcutaneous  tissue where he had nonviable necrotic skin tissue and breakdown over  the area.  It was pleasure seeing him today.  We will continue careful  close observatory approach and monitor his x-rays.      Dionne Ano. Amanda Pea, M.D.  Electronically Signed     WMG/MEDQ  D:  08/29/2008  T:  08/30/2008  Job:  [id number]

## 2011-05-02 NOTE — Assessment & Plan Note (Signed)
OFFICE VISIT   [name], [name]  DOB:  [birth year]                                        November 30, 2009  CHART #:  [id number]   The patient came for followup today.  His chest wall is stable.  His  blood pressure was 110/70, pulse 81, respirations 18, and sats were 91%.  A chest x-ray showed that all of his hardwares in good position.  He is  doing well overall.  I plan to see him back again in 9 months with  another chest x-ray.   Ines Bloomer, M.D.  Electronically Signed   DPB/MEDQ  D:  11/30/2009  T:  12/01/2009  Job:  [id number]
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
## 2011-05-02 NOTE — Letter (Signed)
September 30, 2008   Dionne Ano. Amanda Pea, MD  [address]  [place]  [place] [address]   Re:  RENLEY, BANWART                DOB:  [birth year]   Dear Dr. Amanda Pea:   I saw the patient back today.  His chest x-ray looks great.  He is  breathing well.  His incisions are well healed.  His blood pressure is  108/66, pulse 75, respirations 18, sats were 95%.  He is making slow,  but steady progress.  He is still not walking, but he has good movement  in his right arm and some movement in his left arm.  Overall though I  think he is doing remarkably well considering everything.  I am planning  to see him back in 2 months with another chest x-ray.   Ines Bloomer, M.D.  Electronically Signed   DPB/MEDQ  D:  09/30/2008  T:  10/01/2008  Job:  [id number]

## 2011-05-02 NOTE — Letter (Signed)
May 19, 2009   Dionne Ano. Amanda Pea, MD  [address]  [place], [place] [address]   Re:  [name], [name]                DOB:  01-23-1936   Dear Dr. Amanda Pea:   I saw the patient back today and he is doing well.  His chest x-ray  looks phenomenal.  His lungs are clear to auscultation and percussion.  Incisions are well healed.  Overall, from my standpoint, he is doing  well.  I will see him back again in 6 months with a chest x-ray.  His  blood pressure was 121/76, pulse 77, respirations 18, and saturations  were 93%.   Ines Bloomer, M.D.  Electronically Signed   DPB/MEDQ  D:  05/19/2009  T:  05/20/2009  Job:  [id number]   cc:   Cherylynn Ridges, M.D.
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
## 2011-05-02 NOTE — Letter (Signed)
December 02, 2008   Marta Lamas. Lindie Spruce, MD  [address]  [place], [place]  [address]   Dear Dr. Lindie Spruce:   Re:  [name], [name]                DOB:  02/24/1936   The patient came for followup today.  His chest was stable.  His chest x-  ray showed reconstruction of the left chest.  The hardware was all in  good position.  He is starting to ambulate more with a walker and is no  longer in a wheelchair.  He has made remarkable progress considering  where he started from.  I will see him at least for another year and we  will see him again in 6 months with another chest x-ray.  His blood  pressure was 107/72, pulse 81, respirations 18, sats were 98%.   Ines Bloomer, M.D.  Electronically Signed   DPB/MEDQ  D:  12/02/2008  T:  12/02/2008  Job:  [id number]   cc:   Dionne Ano. Amanda Pea, M.D.

## 2011-05-02 NOTE — Assessment & Plan Note (Signed)
OFFICE VISIT   Dearcos, [name]  DOB:  [birth year]                                        September 21, 2010  CHART #:  [id number]   HISTORY OF PRESENT ILLNESS:  The patient is status post left thoracotomy  with left rib plating.  The patient was last seen in the office by Dr.  Edwyna Shell 9 months ago.  He presents today for follow up with repeat chest  x-ray.  The patient is without complaints.  He states he is up moving  around without difficulty.  He denies any pain at his left thoracotomy  site.  He denies any shortness of breath.   PHYSICAL EXAMINATION:  Vital Signs:  Blood pressure 133/82, pulse of 96,  respirations of 18, O2 sats 94% to 95% on room air.  Respiratory:  Clear  to auscultation bilaterally.  Cardiac:  Regular rate and rhythm.  Incisions are healed well.   STUDIES:  The patient had PA and lateral chest x-ray obtained today  which is noted to be stable with no acute findings.  No pneumothorax  noted.  All hardware noted to be in place.   IMPRESSION AND PLAN:  The patient is noted to be stable.  He was seen  and evaluated by Dr. Edwyna Shell.  Dr. Edwyna Shell plans to follow up with the  patient in 1 year with a repeat PA and lateral chest x-ray.  The patient  is in agreement.   Carl Blazing, PA   KMD/MEDQ  D:  09/21/2010  T:  09/22/2010  Job:  [id number]

## 2011-05-02 NOTE — Assessment & Plan Note (Signed)
OFFICE VISIT   [name], [name]  DOB:  03-22-1936                                        February 24, 2009  CHART #:  [id number]   The patient came today.  His blood pressure was 118/70, pulse 72, room  respirations 18, and sats were 95%.  He did not take the Voltaren as we  ordered because he went back on his Neurontin, which is given to him by  Dr. Cheree Ditto and his pain subsided.  I discussed this with him and told  him to does not take the Voltaren since Neurontin is taking care of this  as probably a neurogenic pain.  His lungs were clear to auscultation and  percussion.  We will see him back in 3 months with another chest x-ray.   Ines Bloomer, M.D.  Electronically Signed   DPB/MEDQ  D:  02/24/2009  T:  02/24/2009  Job:  [id number]
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
## 2011-05-02 NOTE — Op Note (Signed)
NAME:  [name], Carl NO.:  [id number]   MEDICAL RECORD NO.:  [id number]          PATIENT TYPE:  AMB   LOCATION:  DAY                          FACILITY:  [facility]   PHYSICIAN:  Marlowe Kays, M.D.  DATE OF BIRTH:  12/13/1936   DATE OF PROCEDURE:  04/30/2007  DATE OF DISCHARGE:                               OPERATIVE REPORT   PREOPERATIVE DIAGNOSES:  1. Osteoarthritis of acromioclavicular joint.  2. Chronic impingement syndrome with rotator cuff tendinopathy and      glenohumeral arthritis.   POSTOPERATIVE DIAGNOSES:  1. Osteoarthritis of acromioclavicular joint.  2. Chronic impingement syndrome with rotator cuff tendinopathy and      glenohumeral arthritis.   OPERATION:  1. Right shoulder arthroscopy with:      a.     Debridement of labral disruption, biceps tendon and       undersurface of rotator cuff.      b.     Arthroscopic subacromial decompression.  2. Open resection of distal clavicle right shoulder.   SURGEON:  Marlowe Kays, M.D.   ASSISTANTDruscilla Brownie. Idolina Primer, PA-C.   ANESTHESIA:  General.   PATHOLOGY AND INDICATION FOR PROCEDURE:  He had a history of open  rotator cuff repair in 2003.  He has had progressive pain in his right  shoulder with an MRI demonstrating no full-thickness rotator cuff tear  but rotator cuff tendinopathy, bulky AC joint osteoarthritis and  glenohumeral arthritis associated with some and posterior labral  tearing.  He is here today for a combination of arthroscopic and surgery  for the glenohumeral joint, subacromial decompression and open distal  clavicle resection.   PROCEDURE:  Satisfactory general anesthesia.  Beach-chair position on  the [place] frame.  Right shoulder girdle was prepped with DuraPrep and  draped into a sterile field.  Anatomy of the shoulder joint was marked  out.  The proposed incision for distal clavicle resection, the  subacromial space and lateral and posterior portals were infiltrated  with 0.5% Marcaine with adrenaline.  Through a posterior soft spot  portal I atraumatically entered the glenohumeral joint.  There was  marked synovitis and debris in the joint with extensive labral  degeneration and wear on the humeral head.  Initially we were unable to  appreciate the biceps tendon but did see the area of the biceps anchor.  I consequently was able to get landmarks to advance the scope anteriorly  and used a switching stick to make an anterior incision at the level of  the coracoid anteriorly.  After making an incision and exposing the  switching stick, I placed a metal cannula over it and into the joint and  followed this with the 4.2 shaver, debriding down the synovitis, the  undersurface of the rotator cuff and the labrum.  In the process was  able to finally visualize the biceps tendon, which I also debrided as  well.  It had some tendinopathy close to the anchor but still was  relatively intact.  I also debrided a little bit of the humeral head.  When we had done as much  debridement of the shoulder joint as possible.  I then redirected the scope into the subacromial space and through a  lateral portal used the 4.2 shaver to clean up a relatively small amount  of bursitis-type tissue and also smoothed down the bursal surface of the  rotator cuff which had been abraded.  I then brought in the 9-degree  ArthroCare vaporizer and began removing soft tissue from the  undersurface of the acromion back to the [facility] joint.  The distal clavicle  was visualized through movement.  I followed this with a quarter-inch  oval bur, burring down the undersurface of the distal acromion back to  the [facility] joint, and we alternated back and forth between these three  instruments until we had a wide decompression with the rotator cuff  smooth and no unusual bleeding.  I documented the decompression with x-  rays with his arm to his side and then abducted.  We then evacuated all  fluid possible  from the subacromial space.  I made an incision over the  distal clavicle, isolated it with subperiosteal dissection after  identifying the [facility] joint, and then measured 1.5 cm from the [facility] joint and  made a scribe line on the distal clavicle with the cautery.  I then  undermined the clavicle with baby Hohman's and amputated the clavicle at  this point with the power saw, removing it with towel clip and cautery  technique.  Small spicules of remaining bone were removed with a small  rongeur.  I covered the raw bone with bone wax, irrigated the wound  well, and then placed Gelfoam in the resected area.  I then closed the  periosteal fascial complex over the resection site with interrupted #1  Vicryl and the subcutaneous tissue with 2-0 Vicryl, Steri-Strips on the  open incision and 4-0 nylon in the three portals.  I also infiltrated  all these areas once again with 0.5% Marcaine with adrenalin.  Dry  sterile dressing was applied followed by shoulder immobilizer.  He  tolerated the procedure well.  He was taken to recovery in satisfactory  condition with no known complications.           ______________________________  Marlowe Kays, M.D.     JA/MEDQ  D:  04/30/2007  T:  04/30/2007  Job:  [id number]
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
## 2011-05-02 NOTE — Op Note (Signed)
NAME:  MAXX, PHAM NO.:  [id number]   MEDICAL RECORD NO.:  [id number]          PATIENT TYPE:  INP   LOCATION:  2112                         FACILITY:  [facility]   PHYSICIAN:  Ines Bloomer, M.D. DATE OF BIRTH:  [birth year]   DATE OF PROCEDURE:  DATE OF DISCHARGE:                               OPERATIVE REPORT   PREOPERATIVE DIAGNOSIS:  Open thoracotomy wound.   POSTOPERATIVE DIAGNOSIS:  Open thoracotomy wound.   OPERATION PERFORMED:  Secondary wound closure.   SURGEON:  Ines Bloomer, MD   General anesthesia.   After general anesthesia, the left chest was prepped and draped in the  usual sterile manner.  The wound that was open was opened longer.  There  was some yellow necrotic tissue, which was debrided.  The area was  irrigated copiously.  I then closed the wound with interrupted vertical  mattress #1 Prolenes, and then between these sutures, which were  approximately 2 cm to 2.5 cm apart we put an interrupted simple 0  Prolene to approximate the skin.  After the wound had been closed, dry  and sterile dressing was applied.  The patient returned to the recovery  room in stable condition.      Ines Bloomer, M.D.  Electronically Signed     DPB/MEDQ  D:  07/22/2008  T:  07/22/2008  Job:  [id number]   cc:   Gabrielle Dare. Janee Morn, M.D.

## 2011-05-02 NOTE — Consult Note (Signed)
NAME:  SHAQUAN, MISSEY NO.:  [id number]   MEDICAL RECORD NO.:  [id number]          PATIENT TYPE:  INP   LOCATION:  5021                         FACILITY:  [facility]   PHYSICIAN:  Antonietta Breach, M.D.  DATE OF BIRTH:  07/12/1936   DATE OF CONSULTATION:  07/29/2008  DATE OF DISCHARGE:                                 CONSULTATION   Mr. Petrosky has improved mood.  He has not had any thoughts of harming  himself or others.  He has had no further hallucinations.  He is not  having any delusions.  His memory function is intact.  His orientation  function is intact.  He has solid hope.  His energy is still mildly  decreased.  Concentration mildly decreased.   REVIEW OF SYSTEMS:  GASTROINTESTINAL:  No nausea or loose stools or  other adverse Lexapro effects.   MENTAL STATUS EXAM:  Mr. Stettler is alert.  He is oriented to all spheres.  He has slightly flat affect.  His mood is within normal limits.  Speech  is normal.  Thought process:  Logical, coherent, and goal-directed.  Thought content:  No thoughts of harming himself, no thoughts of harming  others, no delusions, no hallucinations.  Insight is good.   ASSESSMENT:  AXIS I:  293.84 anxiety disorder not otherwise specified.   Would continue Lexapro 10 mg daily for antianxiety.       Antonietta Breach, M.D.  Electronically Signed     JW/MEDQ  D:  07/29/2008  T:  07/29/2008  Job:  [id number]
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
## 2011-09-14 LAB — CBC
HCT: 24.9 — ABNORMAL LOW
HCT: 26.7 — ABNORMAL LOW
HCT: 27.2 — ABNORMAL LOW
HCT: 28.9 — ABNORMAL LOW
HCT: 31 — ABNORMAL LOW
HCT: 31.3 — ABNORMAL LOW
HCT: 32.9 — ABNORMAL LOW
HCT: 27.5 — ABNORMAL LOW
HCT: 28 — ABNORMAL LOW
HCT: 28.3 — ABNORMAL LOW
HCT: 29 — ABNORMAL LOW
HCT: 29.3 — ABNORMAL LOW
Hemoglobin: 10.4 — ABNORMAL LOW
Hemoglobin: 10.6 — ABNORMAL LOW
Hemoglobin: 11.1 — ABNORMAL LOW
Hemoglobin: 11.4 — ABNORMAL LOW
Hemoglobin: 8 — ABNORMAL LOW
Hemoglobin: 9.1 — ABNORMAL LOW
Hemoglobin: 9.3 — ABNORMAL LOW
Hemoglobin: 9.3 — ABNORMAL LOW
Hemoglobin: 9.9 — ABNORMAL LOW
Hemoglobin: 10.1 — ABNORMAL LOW
Hemoglobin: 9.2 — ABNORMAL LOW
Hemoglobin: 9.3 — ABNORMAL LOW
Hemoglobin: 9.5 — ABNORMAL LOW
Hemoglobin: 9.7 — ABNORMAL LOW
Hemoglobin: 9.7 — ABNORMAL LOW
Hemoglobin: 9.9 — ABNORMAL LOW
MCHC: 34
MCHC: 34
MCHC: 34.3
MCHC: 34.5
MCHC: 34.5
MCHC: 34.5
MCHC: 33
MCHC: 33.3
MCHC: 33.5
MCHC: 33.5
MCHC: 33.6
MCHC: 33.7
MCHC: 33.8
MCHC: 34
MCV: 89.3
MCV: 91
MCV: 91.2
MCV: 91.4
MCV: 92.2
MCV: 92.2
MCV: 90.1
MCV: 90.2
MCV: 90.3
MCV: 90.6
MCV: 90.8
MCV: 91
MCV: 91.3
MCV: 91.9
Platelets: 138 — ABNORMAL LOW
Platelets: 148 — ABNORMAL LOW
Platelets: 151
Platelets: 176
Platelets: 238
Platelets: 332
Platelets: 362
Platelets: 367
Platelets: 371
Platelets: 413 — ABNORMAL HIGH
RBC: 2.96 — ABNORMAL LOW
RBC: 2.97 — ABNORMAL LOW
RBC: 3.04 — ABNORMAL LOW
RBC: 3.23 — ABNORMAL LOW
RBC: 3.43 — ABNORMAL LOW
RBC: 3.7 — ABNORMAL LOW
RBC: 3.03 — ABNORMAL LOW
RBC: 3.06 — ABNORMAL LOW
RBC: 3.11 — ABNORMAL LOW
RBC: 3.2 — ABNORMAL LOW
RBC: 3.22 — ABNORMAL LOW
RBC: 3.54 — ABNORMAL LOW
RBC: 3.6 — ABNORMAL LOW
RDW: 13.7
RDW: 14
RDW: 14.2
RDW: 14.4
RDW: 14.6
RDW: 15.9 — ABNORMAL HIGH
RDW: 16.1 — ABNORMAL HIGH
RDW: 16.2 — ABNORMAL HIGH
RDW: 16.3 — ABNORMAL HIGH
RDW: 16.4 — ABNORMAL HIGH
RDW: 16.4 — ABNORMAL HIGH
RDW: 16.6 — ABNORMAL HIGH
RDW: 16.8 — ABNORMAL HIGH
WBC: 11.5 — ABNORMAL HIGH
WBC: 13.4 — ABNORMAL HIGH
WBC: 13.9 — ABNORMAL HIGH
WBC: 23.9 — ABNORMAL HIGH
WBC: 7.1
WBC: 8.9
WBC: 9.5
WBC: 12.3 — ABNORMAL HIGH
WBC: 12.5 — ABNORMAL HIGH
WBC: 13.8 — ABNORMAL HIGH
WBC: 14.2 — ABNORMAL HIGH
WBC: 14.3 — ABNORMAL HIGH
WBC: 15.4 — ABNORMAL HIGH
WBC: 16.2 — ABNORMAL HIGH

## 2011-09-14 LAB — DIFFERENTIAL
Basophils Absolute: 0
Basophils Absolute: 0
Basophils Absolute: 0
Basophils Relative: 0
Basophils Relative: 0
Basophils Relative: 0
Eosinophils Absolute: 0.2
Eosinophils Absolute: 0.4
Eosinophils Absolute: 0.5
Eosinophils Absolute: 0.6
Eosinophils Relative: 3
Eosinophils Relative: 5
Lymphocytes Relative: 12
Lymphocytes Relative: 7 — ABNORMAL LOW
Lymphocytes Relative: 8 — ABNORMAL LOW
Lymphocytes Relative: 9 — ABNORMAL LOW
Lymphs Abs: 1
Lymphs Abs: 1.1
Lymphs Abs: 1.4
Lymphs Abs: 1.4
Lymphs Abs: 1.5
Monocytes Absolute: 0.5
Monocytes Absolute: 0.7
Monocytes Relative: 4
Monocytes Relative: 4
Monocytes Relative: 5
Monocytes Relative: 6
Monocytes Relative: 6
Neutro Abs: 13.3 — ABNORMAL HIGH
Neutrophils Relative %: 82 — ABNORMAL HIGH
Neutrophils Relative %: 78 — ABNORMAL HIGH
Neutrophils Relative %: 82 — ABNORMAL HIGH
Neutrophils Relative %: 84 — ABNORMAL HIGH
Neutrophils Relative %: 84 — ABNORMAL HIGH

## 2011-09-14 LAB — PREPARE FRESH FROZEN PLASMA

## 2011-09-14 LAB — POCT I-STAT 3, ART BLOOD GAS (G3+)
Acid-Base Excess: 1
Acid-Base Excess: 2
Acid-Base Excess: 1
Acid-Base Excess: 3 — ABNORMAL HIGH
Acid-Base Excess: 3 — ABNORMAL HIGH
Acid-Base Excess: 5 — ABNORMAL HIGH
Acid-Base Excess: 5 — ABNORMAL HIGH
Acid-Base Excess: 5 — ABNORMAL HIGH
Acid-Base Excess: 7 — ABNORMAL HIGH
Acid-base deficit: 6 — ABNORMAL HIGH
Acid-base deficit: 7 — ABNORMAL HIGH
Bicarbonate: 20.5
Bicarbonate: 21.9
Bicarbonate: 24.5 — ABNORMAL HIGH
Bicarbonate: 26.7 — ABNORMAL HIGH
Bicarbonate: 26.4 — ABNORMAL HIGH
Bicarbonate: 27.5 — ABNORMAL HIGH
Bicarbonate: 28.9 — ABNORMAL HIGH
Bicarbonate: 29.9 — ABNORMAL HIGH
Bicarbonate: 31 — ABNORMAL HIGH
Bicarbonate: 32 — ABNORMAL HIGH
O2 Saturation: 88
O2 Saturation: 91
O2 Saturation: 91
O2 Saturation: 93
O2 Saturation: 93
O2 Saturation: 94
O2 Saturation: 99
O2 Saturation: 91
O2 Saturation: 94
O2 Saturation: 95
O2 Saturation: 96
O2 Saturation: 97
O2 Saturation: 97
O2 Saturation: 99
Operator id: 222511
Operator id: 242221
Operator id: 248711
Operator id: 281201
Operator id: 282241
Operator id: 135351
Operator id: 135351
Operator id: 234111
Operator id: 234111
Operator id: 261491
Operator id: 277551
Operator id: 282221
Operator id: 282241
Patient temperature: 100.2
Patient temperature: 100.3
Patient temperature: 99.7
Patient temperature: 99.8
Patient temperature: 100.3
Patient temperature: 38.2
Patient temperature: 97.2
Patient temperature: 97.3
Patient temperature: 98
TCO2: 22
TCO2: 22
TCO2: 25
TCO2: 26
TCO2: 26
TCO2: 28
TCO2: 28
TCO2: 29
TCO2: 30
TCO2: 32
TCO2: 33
TCO2: 33
pCO2 arterial: 38
pCO2 arterial: 38.8
pCO2 arterial: 40.8
pCO2 arterial: 41.9
pCO2 arterial: 42.6
pCO2 arterial: 47 — ABNORMAL HIGH
pCO2 arterial: 49.2 — ABNORMAL HIGH
pCO2 arterial: 32.4 — ABNORMAL LOW
pCO2 arterial: 45.1 — ABNORMAL HIGH
pCO2 arterial: 50.5 — ABNORMAL HIGH
pCO2 arterial: 54.9 — ABNORMAL HIGH
pCO2 arterial: 55.8 — ABNORMAL HIGH
pCO2 arterial: 57.9
pCO2 arterial: 58.7
pH, Arterial: 7.362
pH, Arterial: 7.365
pH, Arterial: 7.371
pH, Arterial: 7.372
pH, Arterial: 7.38
pH, Arterial: 7.429
pH, Arterial: 7.317 — ABNORMAL LOW
pH, Arterial: 7.332 — ABNORMAL LOW
pH, Arterial: 7.365
pH, Arterial: 7.385
pH, Arterial: 7.423
pO2, Arterial: 152 — ABNORMAL HIGH
pO2, Arterial: 64 — ABNORMAL LOW
pO2, Arterial: 66 — ABNORMAL LOW
pO2, Arterial: 69 — ABNORMAL LOW
pO2, Arterial: 71 — ABNORMAL LOW
pO2, Arterial: 72 — ABNORMAL LOW
pO2, Arterial: 125 — ABNORMAL HIGH
pO2, Arterial: 69 — ABNORMAL LOW
pO2, Arterial: 69 — ABNORMAL LOW
pO2, Arterial: 71 — ABNORMAL LOW
pO2, Arterial: 79 — ABNORMAL LOW

## 2011-09-14 LAB — TYPE AND SCREEN: ABO/RH(D): A NEG

## 2011-09-14 LAB — COMPREHENSIVE METABOLIC PANEL
ALT: 17
ALT: 18
AST: 28
Albumin: 1.5 — ABNORMAL LOW
Albumin: 1.7 — ABNORMAL LOW
Albumin: 1.5 — ABNORMAL LOW
Alkaline Phosphatase: 119 — ABNORMAL HIGH
BUN: 32 — ABNORMAL HIGH
BUN: 40 — ABNORMAL HIGH
BUN: 48 — ABNORMAL HIGH
CO2: 26
CO2: 27
CO2: 23
Calcium: 6.9 — ABNORMAL LOW
Calcium: 6 — CL
Calcium: 7.7 — ABNORMAL LOW
Chloride: 109
Chloride: 113 — ABNORMAL HIGH
Chloride: 114 — ABNORMAL HIGH
Creatinine, Ser: 1.38
Creatinine, Ser: 1.41
Creatinine, Ser: 1.51 — ABNORMAL HIGH
Creatinine, Ser: 1.99 — ABNORMAL HIGH
GFR calc Af Amer: >60
GFR calc Af Amer: 38 — ABNORMAL LOW
GFR calc Af Amer: 56 — ABNORMAL LOW
GFR calc non Af Amer: 50 — ABNORMAL LOW
GFR calc non Af Amer: 51 — ABNORMAL LOW
GFR calc non Af Amer: 33 — ABNORMAL LOW
Glucose, Bld: 134 — ABNORMAL HIGH
Glucose, Bld: 156 — ABNORMAL HIGH
Potassium: 3.8
Sodium: 141
Sodium: 147 — ABNORMAL HIGH
Total Bilirubin: 1.8 — ABNORMAL HIGH
Total Bilirubin: 2 — ABNORMAL HIGH
Total Bilirubin: 0.9
Total Protein: 4 — ABNORMAL LOW
Total Protein: 4.8 — ABNORMAL LOW
Total Protein: 6

## 2011-09-14 LAB — BASIC METABOLIC PANEL
BUN: 20
BUN: 20
BUN: 48 — ABNORMAL HIGH
BUN: 48 — ABNORMAL HIGH
BUN: 59 — ABNORMAL HIGH
BUN: 77 — ABNORMAL HIGH
BUN: 80 — ABNORMAL HIGH
BUN: 81 — ABNORMAL HIGH
BUN: 84 — ABNORMAL HIGH
CO2: 26
CO2: 28
CO2: 23
CO2: 24
CO2: 25
CO2: 27
CO2: 29
CO2: 29
Calcium: 5.8 — CL
Calcium: 6.8 — ABNORMAL LOW
Calcium: 7.1 — ABNORMAL LOW
Calcium: 7.1 — ABNORMAL LOW
Calcium: 7.2 — ABNORMAL LOW
Calcium: 7.4 — ABNORMAL LOW
Calcium: 7.5 — ABNORMAL LOW
Calcium: 7.5 — ABNORMAL LOW
Calcium: 7.6 — ABNORMAL LOW
Calcium: 7.7 — ABNORMAL LOW
Calcium: 7.7 — ABNORMAL LOW
Calcium: 7.8 — ABNORMAL LOW
Calcium: 7.8 — ABNORMAL LOW
Chloride: 110
Chloride: 111
Chloride: 108
Chloride: 110
Chloride: 111
Chloride: 112
Chloride: 112
Chloride: 112
Chloride: 113 — ABNORMAL HIGH
Chloride: 113 — ABNORMAL HIGH
Creatinine, Ser: 1.02
Creatinine, Ser: 1.09
Creatinine, Ser: 1.11
Creatinine, Ser: 1.64 — ABNORMAL HIGH
Creatinine, Ser: 1.73 — ABNORMAL HIGH
Creatinine, Ser: 1.86 — ABNORMAL HIGH
Creatinine, Ser: 1.97 — ABNORMAL HIGH
Creatinine, Ser: 2.03 — ABNORMAL HIGH
Creatinine, Ser: 2.07 — ABNORMAL HIGH
Creatinine, Ser: 2.11 — ABNORMAL HIGH
GFR calc Af Amer: >60
GFR calc Af Amer: >60
GFR calc Af Amer: >60
GFR calc Af Amer: >60
GFR calc Af Amer: 39 — ABNORMAL LOW
GFR calc Af Amer: 44 — ABNORMAL LOW
GFR calc Af Amer: 45 — ABNORMAL LOW
GFR calc Af Amer: 48 — ABNORMAL LOW
GFR calc Af Amer: 51 — ABNORMAL LOW
GFR calc Af Amer: 55 — ABNORMAL LOW
GFR calc Af Amer: >60
GFR calc non Af Amer: >60
GFR calc non Af Amer: >60
GFR calc non Af Amer: >60
GFR calc non Af Amer: 31 — ABNORMAL LOW
GFR calc non Af Amer: 31 — ABNORMAL LOW
GFR calc non Af Amer: 34 — ABNORMAL LOW
GFR calc non Af Amer: 36 — ABNORMAL LOW
GFR calc non Af Amer: 39 — ABNORMAL LOW
GFR calc non Af Amer: 40 — ABNORMAL LOW
Glucose, Bld: 116 — ABNORMAL HIGH
Glucose, Bld: 124 — ABNORMAL HIGH
Glucose, Bld: 126 — ABNORMAL HIGH
Glucose, Bld: 109 — ABNORMAL HIGH
Glucose, Bld: 117 — ABNORMAL HIGH
Glucose, Bld: 120 — ABNORMAL HIGH
Glucose, Bld: 140 — ABNORMAL HIGH
Glucose, Bld: 153 — ABNORMAL HIGH
Glucose, Bld: 162 — ABNORMAL HIGH
Potassium: 3.9
Potassium: 4
Potassium: 4.3
Potassium: 4.3
Potassium: 3.6
Potassium: 3.8
Potassium: 3.9
Potassium: 4.7
Sodium: 140
Sodium: 142
Sodium: 143
Sodium: 147 — ABNORMAL HIGH
Sodium: 141
Sodium: 144
Sodium: 148 — ABNORMAL HIGH
Sodium: 149 — ABNORMAL HIGH
Sodium: 151 — ABNORMAL HIGH

## 2011-09-14 LAB — PROTIME-INR
INR: 1.1
INR: 1.2
INR: 1.2
INR: 1.4
INR: 1.4
INR: 1.7 — ABNORMAL HIGH
Prothrombin Time: 14.6
Prothrombin Time: 15.6 — ABNORMAL HIGH
Prothrombin Time: 17.4 — ABNORMAL HIGH

## 2011-09-14 LAB — POCT I-STAT 7, (LYTES, BLD GAS, ICA,H+H)
Acid-Base Excess: 1
Acid-base deficit: 1
Acid-base deficit: 1
Acid-base deficit: 2
Bicarbonate: 23.6
Bicarbonate: 24
Bicarbonate: 24.5 — ABNORMAL HIGH
Bicarbonate: 25.9 — ABNORMAL HIGH
Calcium, Ion: 0.98 — ABNORMAL LOW
Calcium, Ion: 1.03 — ABNORMAL LOW
Calcium, Ion: 1.03 — ABNORMAL LOW
Calcium, Ion: 1.04 — ABNORMAL LOW
Calcium, Ion: 1.06 — ABNORMAL LOW
Calcium, Ion: 1.07 — ABNORMAL LOW
Calcium, Ion: 1.1 — ABNORMAL LOW
HCT: 20 — ABNORMAL LOW
HCT: 23 — ABNORMAL LOW
HCT: 28 — ABNORMAL LOW
HCT: 29 — ABNORMAL LOW
Hemoglobin: 6.8 — CL
Hemoglobin: 9.5 — ABNORMAL LOW
O2 Saturation: 100
O2 Saturation: 100
O2 Saturation: 89
O2 Saturation: 91
O2 Saturation: 96
O2 Saturation: 97
O2 Saturation: 99
Operator id: 119851
Operator id: 153281
Operator id: 153281
Patient temperature: 36.7
Patient temperature: 37.1
Patient temperature: 37.5
Patient temperature: 38.1
Potassium: 4
Potassium: 4
Potassium: 4
Potassium: 4.1
Potassium: 4.2
Potassium: 4.3
Sodium: 142
Sodium: 143
Sodium: 144
TCO2: 25
TCO2: 25
TCO2: 25
TCO2: 26
TCO2: 27
TCO2: 29
pCO2 arterial: 38.2
pCO2 arterial: 40.1
pCO2 arterial: 40.2
pCO2 arterial: 43.3
pCO2 arterial: 51.1 — ABNORMAL HIGH
pCO2 arterial: 64.8
pH, Arterial: 7.326 — ABNORMAL LOW
pH, Arterial: 7.362
pH, Arterial: 7.397
pO2, Arterial: 140 — ABNORMAL HIGH
pO2, Arterial: 224 — ABNORMAL HIGH
pO2, Arterial: 62 — ABNORMAL LOW
pO2, Arterial: 89

## 2011-09-14 LAB — BLOOD GAS, ARTERIAL
Acid-Base Excess: 5.7 — ABNORMAL HIGH
Acid-Base Excess: 5.8 — ABNORMAL HIGH
Bicarbonate: 26.1 — ABNORMAL HIGH
Bicarbonate: 30.7 — ABNORMAL HIGH
Drawn by: 29943
FIO2: 0.5
MECHVT: 480
O2 Saturation: 96.4
O2 Saturation: 97.1
PEEP: 8
RATE: 24
TCO2: 27.6
TCO2: 32.4
pCO2 arterial: 49.6 — ABNORMAL HIGH
pH, Arterial: 7.341 — ABNORMAL LOW
pO2, Arterial: 128 — ABNORMAL HIGH
pO2, Arterial: 80.3
pO2, Arterial: 93.8

## 2011-09-14 LAB — CLOSTRIDIUM DIFFICILE EIA
C difficile Toxins A+B, EIA: NEGATIVE
C difficile Toxins A+B, EIA: NEGATIVE

## 2011-09-14 LAB — PREPARE PLATELET PHERESIS

## 2011-09-14 LAB — CULTURE, BAL-QUANTITATIVE W GRAM STAIN: Colony Count: >=100000

## 2011-09-14 LAB — CULTURE, BLOOD (ROUTINE X 2): Culture: NO GROWTH

## 2011-09-14 LAB — WOUND CULTURE

## 2011-09-14 LAB — ABO/RH: ABO/RH(D): A NEG

## 2011-09-14 LAB — URINE CULTURE
Colony Count: NO GROWTH
Colony Count: NO GROWTH
Culture: NO GROWTH
Culture: NO GROWTH

## 2011-09-14 LAB — CHLORIDE, URINE, RANDOM: Chloride Urine: 67

## 2011-09-14 LAB — NA AND K (SODIUM & POTASSIUM), RAND UR
Potassium Urine: 33
Sodium, Ur: 75

## 2011-09-14 LAB — TSH: TSH: 2.613

## 2011-09-14 LAB — CARDIAC PANEL(CRET KIN+CKTOT+MB+TROPI)
CK, MB: 1.3
CK, MB: 1.3
CK, MB: 2.7
CK, MB: 3.2
Relative Index: 0.5
Relative Index: 1.7
Total CK: 139
Total CK: 161
Total CK: 214
Total CK: 222
Troponin I: 0.02
Troponin I: 0.03
Troponin I: 0.04

## 2011-09-14 LAB — APTT
aPTT: 29
aPTT: 34

## 2011-09-14 LAB — FERRITIN: Ferritin: 1180 — ABNORMAL HIGH (ref 22–322)

## 2011-09-14 LAB — PREALBUMIN: Prealbumin: 10.6 — ABNORMAL LOW

## 2011-09-14 LAB — CROSSMATCH: Antibody Screen: NEGATIVE

## 2011-09-14 LAB — LACTATE DEHYDROGENASE: LDH: 172

## 2011-09-14 LAB — POCT I-STAT, CHEM 8
BUN: 16
Calcium, Ion: 1.22
Chloride: 107
Creatinine, Ser: 1.2
Glucose, Bld: 180 — ABNORMAL HIGH

## 2011-09-14 LAB — RENAL FUNCTION PANEL
Albumin: 1.5 — ABNORMAL LOW
BUN: 86 — ABNORMAL HIGH
Chloride: 114 — ABNORMAL HIGH
Creatinine, Ser: 2.08 — ABNORMAL HIGH

## 2011-09-14 LAB — IRON AND TIBC
Iron: <10 — ABNORMAL LOW
UIBC: 138

## 2011-09-14 LAB — LEGIONELLA ANTIGEN, URINE: Legionella Antigen, Urine: NEGATIVE

## 2011-09-14 LAB — CATH TIP CULTURE: Culture: NO GROWTH

## 2011-09-14 LAB — CALCIUM, IONIZED
Calcium, Ion: 1.11 — ABNORMAL LOW
Calcium, Ion: 1.17

## 2011-09-14 LAB — VITAMIN B12: Vitamin B-12: 537 (ref 211–911)

## 2011-09-14 LAB — URINALYSIS, ROUTINE W REFLEX MICROSCOPIC
Ketones, ur: NEGATIVE
Leukocytes, UA: NEGATIVE
Nitrite: NEGATIVE
Protein, ur: 30 — AB
Urobilinogen, UA: 0.2

## 2011-09-14 LAB — CK: Total CK: 1070 — ABNORMAL HIGH

## 2011-09-14 LAB — GENTAMICIN LEVEL, RANDOM: Gentamicin Rm: 6.2

## 2011-09-14 LAB — URINE MICROSCOPIC-ADD ON

## 2011-09-14 LAB — D-DIMER, QUANTITATIVE: D-Dimer, Quant: 7.41 — ABNORMAL HIGH

## 2011-09-14 LAB — RETICULOCYTES
RBC.: 3.32 — ABNORMAL LOW
Retic Count, Absolute: 109.6

## 2011-09-14 LAB — MAGNESIUM: Magnesium: 1.8

## 2011-09-15 LAB — URINALYSIS, ROUTINE W REFLEX MICROSCOPIC
Bilirubin Urine: NEGATIVE
Glucose, UA: NEGATIVE
Ketones, ur: NEGATIVE
Nitrite: NEGATIVE
Nitrite: NEGATIVE
Nitrite: NEGATIVE
Protein, ur: 100 — AB
Specific Gravity, Urine: 1.011
Specific Gravity, Urine: 1.014
Urobilinogen, UA: 0.2
pH: 7
pH: 6.5

## 2011-09-15 LAB — DIFFERENTIAL
Basophils Absolute: 0
Basophils Absolute: 0
Basophils Absolute: 0.1
Basophils Relative: 1
Basophils Relative: 1
Basophils Relative: 1
Eosinophils Absolute: 0.2
Eosinophils Absolute: 0.3
Eosinophils Absolute: 1 — ABNORMAL HIGH
Eosinophils Relative: 2
Eosinophils Relative: 3
Eosinophils Relative: 8 — ABNORMAL HIGH
Eosinophils Relative: 9 — ABNORMAL HIGH
Lymphocytes Relative: 13
Lymphocytes Relative: 28
Lymphs Abs: 1.5
Lymphs Abs: 2.3
Lymphs Abs: 2.4
Lymphs Abs: 2.9
Monocytes Absolute: 0.6
Monocytes Absolute: 0.9
Monocytes Absolute: 0.7
Monocytes Absolute: 0.9
Monocytes Relative: 6
Monocytes Relative: 7
Monocytes Relative: 6
Neutro Abs: 6.8
Neutro Abs: 7.4
Neutrophils Relative %: 64
Neutrophils Relative %: 69
Neutrophils Relative %: 59

## 2011-09-15 LAB — CBC
HCT: 27.8 — ABNORMAL LOW
HCT: 28.3 — ABNORMAL LOW
HCT: 28.3 — ABNORMAL LOW
HCT: 28.3 — ABNORMAL LOW
HCT: 28.8 — ABNORMAL LOW
HCT: 29.2 — ABNORMAL LOW
HCT: 29.6 — ABNORMAL LOW
HCT: 30.3 — ABNORMAL LOW
HCT: 29.1 — ABNORMAL LOW
HCT: 35.6 — ABNORMAL LOW
Hemoglobin: 9.1 — ABNORMAL LOW
Hemoglobin: 9.4 — ABNORMAL LOW
Hemoglobin: 9.6 — ABNORMAL LOW
Hemoglobin: 9.7 — ABNORMAL LOW
Hemoglobin: 9.7 — ABNORMAL LOW
Hemoglobin: 9.9 — ABNORMAL LOW
Hemoglobin: 9.9 — ABNORMAL LOW
Hemoglobin: 12.4 — ABNORMAL LOW
Hemoglobin: 9.4 — ABNORMAL LOW
Hemoglobin: 9.7 — ABNORMAL LOW
Hemoglobin: 9.9 — ABNORMAL LOW
MCHC: 32.4
MCHC: 32.7
MCHC: 32.9
MCHC: 33.1
MCHC: 33.3
MCHC: 33.3
MCHC: 33.4
MCHC: 33.6
MCHC: 33.8
MCHC: 32.3
MCHC: 33
MCHC: 33.3
MCHC: 33.3
MCV: 89.1
MCV: 89.3
MCV: 89.4
MCV: 89.5
MCV: 89.7
MCV: 90.1
MCV: 90.2
MCV: 90.4
MCV: 90.9
MCV: 90.9
MCV: 91.2
MCV: 91.3
Platelets: 190
Platelets: 192
Platelets: 195
Platelets: 205
Platelets: 211
Platelets: 223
Platelets: 229
Platelets: 250
Platelets: 272
Platelets: 190
Platelets: 200
Platelets: 246
Platelets: 256
Platelets: 256
Platelets: 273
RBC: 2.94 — ABNORMAL LOW
RBC: 3.02 — ABNORMAL LOW
RBC: 3.07 — ABNORMAL LOW
RBC: 3.09 — ABNORMAL LOW
RBC: 3.14 — ABNORMAL LOW
RBC: 3.16 — ABNORMAL LOW
RBC: 3.17 — ABNORMAL LOW
RBC: 3.28 — ABNORMAL LOW
RBC: 3.29 — ABNORMAL LOW
RBC: 3.3 — ABNORMAL LOW
RBC: 3.3 — ABNORMAL LOW
RBC: 3.31 — ABNORMAL LOW
RBC: 3.38 — ABNORMAL LOW
RBC: 4.09 — ABNORMAL LOW
RDW: 16 — ABNORMAL HIGH
RDW: 16.4 — ABNORMAL HIGH
RDW: 16.6 — ABNORMAL HIGH
RDW: 16.6 — ABNORMAL HIGH
RDW: 17 — ABNORMAL HIGH
RDW: 17.9 — ABNORMAL HIGH
RDW: 18 — ABNORMAL HIGH
RDW: 18.1 — ABNORMAL HIGH
RDW: 18.5 — ABNORMAL HIGH
RDW: 18.6 — ABNORMAL HIGH
RDW: 18.6 — ABNORMAL HIGH
WBC: 10
WBC: 10.1
WBC: 10.3
WBC: 10.4
WBC: 11.9 — ABNORMAL HIGH
WBC: 12.1 — ABNORMAL HIGH
WBC: 12.2 — ABNORMAL HIGH
WBC: 8.5
WBC: 9.3
WBC: 9.7
WBC: 9.8
WBC: 9.9
WBC: 10.5
WBC: 11.4 — ABNORMAL HIGH

## 2011-09-15 LAB — RETICULOCYTES
RBC.: 3.12 — ABNORMAL LOW
RBC.: 3.4 — ABNORMAL LOW
RBC.: 3.62 — ABNORMAL LOW
RBC.: 4.04 — ABNORMAL LOW
Retic Count, Absolute: 74.8
Retic Count, Absolute: 87.5
Retic Count, Absolute: 161.6
Retic Ct Pct: 3.2 — ABNORMAL HIGH
Retic Ct Pct: 4.3 — ABNORMAL HIGH

## 2011-09-15 LAB — PREPARE FRESH FROZEN PLASMA

## 2011-09-15 LAB — BASIC METABOLIC PANEL
BUN: 39 — ABNORMAL HIGH
BUN: 43 — ABNORMAL HIGH
BUN: 44 — ABNORMAL HIGH
BUN: 47 — ABNORMAL HIGH
BUN: 47 — ABNORMAL HIGH
BUN: 49 — ABNORMAL HIGH
BUN: 52 — ABNORMAL HIGH
BUN: 52 — ABNORMAL HIGH
BUN: 17
BUN: 19
BUN: 22
BUN: 26 — ABNORMAL HIGH
CO2: 19
CO2: 20
CO2: 21
CO2: 22
CO2: 22
CO2: 26
CO2: 29
CO2: 30
CO2: 31
Calcium: 6.9 — ABNORMAL LOW
Calcium: 7.6 — ABNORMAL LOW
Calcium: 7.7 — ABNORMAL LOW
Calcium: 7 — ABNORMAL LOW
Calcium: 7.9 — ABNORMAL LOW
Calcium: 8.9
Chloride: 109
Chloride: 111
Chloride: 112
Chloride: 113 — ABNORMAL HIGH
Chloride: 113 — ABNORMAL HIGH
Chloride: 114 — ABNORMAL HIGH
Chloride: 114 — ABNORMAL HIGH
Chloride: 114 — ABNORMAL HIGH
Chloride: 115 — ABNORMAL HIGH
Chloride: 116 — ABNORMAL HIGH
Chloride: 116 — ABNORMAL HIGH
Chloride: 111
Creatinine, Ser: 1.58 — ABNORMAL HIGH
Creatinine, Ser: 2.01 — ABNORMAL HIGH
Creatinine, Ser: 2.26 — ABNORMAL HIGH
Creatinine, Ser: 2.28 — ABNORMAL HIGH
Creatinine, Ser: 2.32 — ABNORMAL HIGH
Creatinine, Ser: 2.37 — ABNORMAL HIGH
Creatinine, Ser: 2.57 — ABNORMAL HIGH
Creatinine, Ser: 2.9 — ABNORMAL HIGH
Creatinine, Ser: 0.71
GFR calc Af Amer: 28 — ABNORMAL LOW
GFR calc Af Amer: 33 — ABNORMAL LOW
GFR calc Af Amer: 34 — ABNORMAL LOW
GFR calc Af Amer: 35 — ABNORMAL LOW
GFR calc Af Amer: 42 — ABNORMAL LOW
GFR calc Af Amer: 53 — ABNORMAL LOW
GFR calc Af Amer: >60
GFR calc Af Amer: >60
GFR calc non Af Amer: 23 — ABNORMAL LOW
GFR calc non Af Amer: 25 — ABNORMAL LOW
GFR calc non Af Amer: 28 — ABNORMAL LOW
GFR calc non Af Amer: 29 — ABNORMAL LOW
GFR calc non Af Amer: 41 — ABNORMAL LOW
GFR calc non Af Amer: >60
GFR calc non Af Amer: >60
GFR calc non Af Amer: >60
Glucose, Bld: 84
Glucose, Bld: 87
Glucose, Bld: 87
Glucose, Bld: 91
Glucose, Bld: 96
Glucose, Bld: 97
Glucose, Bld: 135 — ABNORMAL HIGH
Glucose, Bld: 139 — ABNORMAL HIGH
Glucose, Bld: 89
Glucose, Bld: 99
Potassium: 2.5 — CL
Potassium: 2.7 — CL
Potassium: 2.9 — ABNORMAL LOW
Potassium: 3.1 — ABNORMAL LOW
Potassium: 3.3 — ABNORMAL LOW
Potassium: 3.6
Potassium: 2.9 — ABNORMAL LOW
Potassium: 3.2 — ABNORMAL LOW
Potassium: 4.6
Sodium: 141
Sodium: 142
Sodium: 143
Sodium: 143
Sodium: 137
Sodium: 141
Sodium: 145

## 2011-09-15 LAB — POCT I-STAT 3, ART BLOOD GAS (G3+)
Acid-base deficit: 6 — ABNORMAL HIGH
Bicarbonate: 18.7 — ABNORMAL LOW
Operator id: 145191
TCO2: 20
pH, Arterial: 7.376
pO2, Arterial: 66 — ABNORMAL LOW

## 2011-09-15 LAB — URINE CULTURE
Colony Count: NO GROWTH
Culture: NO GROWTH
Culture: NO GROWTH

## 2011-09-15 LAB — COMPREHENSIVE METABOLIC PANEL
ALT: 15
ALT: 38
ALT: 11
ALT: 13
AST: 28
AST: 28
Albumin: 1.5 — ABNORMAL LOW
Albumin: 1.8 — ABNORMAL LOW
Albumin: 2.3 — ABNORMAL LOW
Alkaline Phosphatase: 137 — ABNORMAL HIGH
Alkaline Phosphatase: 129 — ABNORMAL HIGH
Alkaline Phosphatase: 218 — ABNORMAL HIGH
CO2: 17 — ABNORMAL LOW
CO2: 23
Calcium: 7.7 — ABNORMAL LOW
Calcium: 7.8 — ABNORMAL LOW
Calcium: 7.8 — ABNORMAL LOW
Chloride: 104
Creatinine, Ser: 2.04 — ABNORMAL HIGH
GFR calc Af Amer: 37 — ABNORMAL LOW
GFR calc Af Amer: 39 — ABNORMAL LOW
GFR calc Af Amer: >60
GFR calc non Af Amer: 19 — ABNORMAL LOW
GFR calc non Af Amer: 30 — ABNORMAL LOW
Glucose, Bld: 100 — ABNORMAL HIGH
Potassium: 4.6
Potassium: 4.1
Potassium: 4.1
Sodium: 142
Sodium: 142
Sodium: 142
Sodium: 135
Sodium: 143
Total Bilirubin: 0.5
Total Protein: 5.9 — ABNORMAL LOW
Total Protein: 6.6

## 2011-09-15 LAB — POCT I-STAT EG7
Acid-base deficit: 3 — ABNORMAL HIGH
Bicarbonate: 21.5
HCT: 35 — ABNORMAL LOW
Hemoglobin: 11.9 — ABNORMAL LOW
TCO2: 23
pO2, Ven: 34

## 2011-09-15 LAB — BLOOD GAS, ARTERIAL
Bicarbonate: 17.4 — ABNORMAL LOW
TCO2: 18.4
pCO2 arterial: 31.2 — ABNORMAL LOW
pH, Arterial: 7.366
pO2, Arterial: 55.8 — ABNORMAL LOW

## 2011-09-15 LAB — CULTURE, BLOOD (ROUTINE X 2)
Culture: NO GROWTH
Culture: NO GROWTH

## 2011-09-15 LAB — PROTIME-INR
INR: 1.3
Prothrombin Time: 16.6 — ABNORMAL HIGH
Prothrombin Time: 18.4 — ABNORMAL HIGH

## 2011-09-15 LAB — HEPARIN LEVEL (UNFRACTIONATED)
Heparin Unfractionated: 0.26 — ABNORMAL LOW
Heparin Unfractionated: 0.37
Heparin Unfractionated: 0.4
Heparin Unfractionated: 0.41
Heparin Unfractionated: 0.42
Heparin Unfractionated: 0.42
Heparin Unfractionated: 0.43
Heparin Unfractionated: 0.45
Heparin Unfractionated: 0.47
Heparin Unfractionated: 0.49
Heparin Unfractionated: 0.55
Heparin Unfractionated: <0.1 — ABNORMAL LOW

## 2011-09-15 LAB — CROSSMATCH

## 2011-09-15 LAB — GLUCOSE, CAPILLARY
Glucose-Capillary: 105 — ABNORMAL HIGH
Glucose-Capillary: 107 — ABNORMAL HIGH
Glucose-Capillary: 117 — ABNORMAL HIGH
Glucose-Capillary: 123 — ABNORMAL HIGH
Glucose-Capillary: 124 — ABNORMAL HIGH
Glucose-Capillary: 88
Glucose-Capillary: 89
Glucose-Capillary: 94

## 2011-09-15 LAB — CATH TIP CULTURE
Culture: 4
Culture: >100

## 2011-09-15 LAB — WOUND CULTURE: Culture: NO GROWTH

## 2011-09-15 LAB — DIGOXIN LEVEL
Digoxin Level: 0.4 — ABNORMAL LOW
Digoxin Level: 0.5 — ABNORMAL LOW

## 2011-09-15 LAB — RENAL FUNCTION PANEL
Albumin: 1.4 — ABNORMAL LOW
BUN: 45 — ABNORMAL HIGH
CO2: 20
Chloride: 114 — ABNORMAL HIGH
Creatinine, Ser: 2.18 — ABNORMAL HIGH
Glucose, Bld: 131 — ABNORMAL HIGH

## 2011-09-15 LAB — URINE MICROSCOPIC-ADD ON

## 2011-09-15 LAB — CLOSTRIDIUM DIFFICILE EIA

## 2011-09-15 LAB — PREALBUMIN: Prealbumin: 20.1

## 2011-09-15 LAB — APTT: aPTT: 127 — ABNORMAL HIGH

## 2011-09-15 LAB — CULTURE, BAL-QUANTITATIVE W GRAM STAIN

## 2011-09-19 ENCOUNTER — Other Ambulatory Visit: Payer: Self-pay | Admitting: Thoracic Surgery

## 2011-09-19 DIAGNOSIS — S2249XA Multiple fractures of ribs, unspecified side, initial encounter for closed fracture: Secondary | ICD-10-CM

## 2011-09-20 LAB — GLUCOSE, CAPILLARY
Glucose-Capillary: 103 — ABNORMAL HIGH
Glucose-Capillary: 107 — ABNORMAL HIGH
Glucose-Capillary: 107 — ABNORMAL HIGH
Glucose-Capillary: 110 — ABNORMAL HIGH
Glucose-Capillary: 113 — ABNORMAL HIGH
Glucose-Capillary: 120 — ABNORMAL HIGH
Glucose-Capillary: 121 — ABNORMAL HIGH
Glucose-Capillary: 129 — ABNORMAL HIGH
Glucose-Capillary: 144 — ABNORMAL HIGH
Glucose-Capillary: 89
Glucose-Capillary: 90
Glucose-Capillary: 92
Glucose-Capillary: 92
Glucose-Capillary: 96
Glucose-Capillary: 97
Glucose-Capillary: 98
Glucose-Capillary: 99

## 2011-09-20 LAB — URINALYSIS, ROUTINE W REFLEX MICROSCOPIC
Protein, ur: 30 — AB
Urobilinogen, UA: 1

## 2011-09-20 LAB — URINE CULTURE: Colony Count: >=100000

## 2011-09-20 LAB — URINE MICROSCOPIC-ADD ON

## 2011-09-20 LAB — CLOSTRIDIUM DIFFICILE EIA: C difficile Toxins A+B, EIA: NEGATIVE

## 2011-09-25 ENCOUNTER — Encounter: Payer: Self-pay | Admitting: *Deleted

## 2011-09-26 ENCOUNTER — Encounter: Payer: Self-pay | Admitting: Thoracic Surgery

## 2011-09-26 ENCOUNTER — Ambulatory Visit (INDEPENDENT_AMBULATORY_CARE_PROVIDER_SITE_OTHER): Payer: Medicare Other | Admitting: Thoracic Surgery

## 2011-09-26 ENCOUNTER — Ambulatory Visit
Admission: RE | Admit: 2011-09-26 | Discharge: 2011-09-26 | Disposition: A | Discharge status: Home / Self Care | Payer: Medicare Other | Source: Ambulatory Visit | Attending: Thoracic Surgery | Admitting: Thoracic Surgery

## 2011-09-26 VITALS — BP 119/73 | HR 70 | Resp 16 | Ht 75.0 in | Wt 265.0 lb

## 2011-09-26 DIAGNOSIS — S2249XA Multiple fractures of ribs, unspecified side, initial encounter for closed fracture: Secondary | ICD-10-CM
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
## 2011-12-29 ENCOUNTER — Ambulatory Visit: Payer: Medicare Other | Attending: Internal Medicine | Admitting: Rehabilitative and Restorative Service Providers"

## 2011-12-29 DIAGNOSIS — R269 Unspecified abnormalities of gait and mobility: Secondary | ICD-10-CM | POA: Insufficient documentation

## 2011-12-29 DIAGNOSIS — IMO0001 Reserved for inherently not codable concepts without codable children: Secondary | ICD-10-CM | POA: Insufficient documentation

## 2012-01-10 ENCOUNTER — Ambulatory Visit: Payer: Medicare Other | Admitting: Rehabilitative and Restorative Service Providers"

## 2012-01-16 ENCOUNTER — Ambulatory Visit: Payer: Medicare Other | Admitting: Rehabilitative and Restorative Service Providers"

## 2012-01-19 ENCOUNTER — Ambulatory Visit: Payer: Medicare Other | Attending: Internal Medicine | Admitting: Rehabilitative and Restorative Service Providers"

## 2012-01-19 DIAGNOSIS — IMO0001 Reserved for inherently not codable concepts without codable children: Secondary | ICD-10-CM | POA: Insufficient documentation

## 2012-01-19 DIAGNOSIS — R269 Unspecified abnormalities of gait and mobility: Secondary | ICD-10-CM | POA: Insufficient documentation

## 2012-01-23 ENCOUNTER — Ambulatory Visit: Payer: Medicare Other | Admitting: Rehabilitative and Restorative Service Providers"

## 2012-01-26 ENCOUNTER — Ambulatory Visit: Payer: Medicare Other | Admitting: Rehabilitative and Restorative Service Providers"

## 2012-01-30 ENCOUNTER — Encounter: Payer: Medicare Other | Admitting: Rehabilitative and Restorative Service Providers"

## 2012-02-02 ENCOUNTER — Encounter: Payer: Medicare Other | Admitting: Rehabilitative and Restorative Service Providers"

## 2012-02-06 ENCOUNTER — Encounter: Payer: Medicare Other | Admitting: Rehabilitative and Restorative Service Providers"

## 2012-02-09 ENCOUNTER — Ambulatory Visit: Payer: Medicare Other | Admitting: Rehabilitative and Restorative Service Providers"

## 2012-02-13 ENCOUNTER — Encounter: Payer: Medicare Other | Admitting: Rehabilitative and Restorative Service Providers"

## 2012-03-28 ENCOUNTER — Encounter: Payer: Self-pay | Admitting: Internal Medicine

## 2012-05-15 ENCOUNTER — Encounter: Payer: Self-pay | Admitting: Internal Medicine

## 2012-05-15 ENCOUNTER — Ambulatory Visit (AMBULATORY_SURGERY_CENTER): Payer: 59 | Admitting: *Deleted

## 2012-05-15 VITALS — Ht 74.0 in | Wt 271.1 lb

## 2012-05-15 DIAGNOSIS — Z1211 Encounter for screening for malignant neoplasm of colon: Secondary | ICD-10-CM

## 2012-05-15 MED ORDER — MOVIPREP 100 G PO SOLR: ORAL | Status: DC

## 2012-05-27 ENCOUNTER — Ambulatory Visit (AMBULATORY_SURGERY_CENTER): Payer: 59 | Admitting: Internal Medicine

## 2012-05-27 ENCOUNTER — Encounter: Payer: Self-pay | Admitting: Internal Medicine

## 2012-05-27 VITALS — BP 108/71 | HR 78 | Temp 98.0°F | Resp 18 | Ht 74.0 in | Wt 271.0 lb

## 2012-05-27 DIAGNOSIS — D126 Benign neoplasm of colon, unspecified: Secondary | ICD-10-CM

## 2012-05-27 DIAGNOSIS — Z1211 Encounter for screening for malignant neoplasm of colon: Secondary | ICD-10-CM

## 2012-05-27 DIAGNOSIS — Z86010 Personal history of colonic polyps: Secondary | ICD-10-CM

## 2012-05-27 MED ORDER — SODIUM CHLORIDE 0.9 % IV SOLN: 500.0000 mL | INTRAVENOUS | Status: DC

## 2012-05-27 NOTE — Patient Instructions (Signed)
YOU HAD AN ENDOSCOPIC PROCEDURE TODAY AT THE [facility]: Refer to the procedure report that was given to you for any specific questions about what was found during the examination.  If the procedure report does not answer your questions, please call your gastroenterologist to clarify.  If you requested that your care partner not be given the details of your procedure findings, then the procedure report has been included in a sealed envelope for you to review at your convenience later.  YOU SHOULD EXPECT: Some feelings of bloating in the abdomen. Passage of more gas than usual.  Walking can help get rid of the air that was put into your GI tract during the procedure and reduce the bloating. If you had a lower endoscopy (such as a colonoscopy or flexible sigmoidoscopy) you may notice spotting of blood in your stool or on the toilet paper. If you underwent a bowel prep for your procedure, then you may not have a normal bowel movement for a few days.  DIET: Your first meal following the procedure should be a light meal and then it is ok to progress to your normal diet.  A half-sandwich or bowl of soup is an example of a good first meal.  Heavy or fried foods are harder to digest and may make you feel nauseous or bloated.  Likewise meals heavy in dairy and vegetables can cause extra gas to form and this can also increase the bloating.  Drink plenty of fluids but you should avoid alcoholic beverages for 24 hours.  ACTIVITY: Your care partner should take you home directly after the procedure.  You should plan to take it easy, moving slowly for the rest of the day.  You can resume normal activity the day after the procedure however you should NOT DRIVE or use heavy machinery for 24 hours (because of the sedation medicines used during the test).    SYMPTOMS TO REPORT IMMEDIATELY: A gastroenterologist can be reached at any hour.  During normal business hours, 8:30 AM to 5:00 PM Monday through Friday,  call [phone].  After hours and on weekends, please call the GI answering service at [phone] who will take a message and have the physician on call contact you.   Following lower endoscopy (colonoscopy or flexible sigmoidoscopy):  Excessive amounts of blood in the stool  Significant tenderness or worsening of abdominal pains  Swelling of the abdomen that is new, acute  Fever of 100F or higher   FOLLOW UP: If any biopsies were taken you will be contacted by phone or by letter within the next 1-3 weeks.  Call your gastroenterologist if you have not heard about the biopsies in 3 weeks.  Our staff will call the home number listed on your records the next business day following your procedure to check on you and address any questions or concerns that you may have at that time regarding the information given to you following your procedure. This is a courtesy call and so if there is no answer at the home number and we have not heard from you through the emergency physician on call, we will assume that you have returned to your regular daily activities without incident.  SIGNATURES/CONFIDENTIALITY: You and/or your care partner have signed paperwork which will be entered into your electronic medical record.  These signatures attest to the fact that that the information above on your After Visit Summary has been reviewed and is understood.  Full responsibility of the confidentiality of   this discharge information lies with you and/or your care-partner.   INFORMATION ON POLYPS AND DIVERTICULOSIS AND HIGH FIBER DIET GIVEN TO YOU TODAY 

## 2012-05-27 NOTE — Op Note (Signed)
[facility] [address] [place], [place]  [address]  COLONOSCOPY PROCEDURE REPORT  PATIENT:  [name], [name]  MR#:  [id number] BIRTHDATE:  Oct 10, 1936, 75 yrs. old  GENDER:  male ENDOSCOPIST:  Wilhemina Bonito. Eda Keys, MD REF. BY:  Surveillance Program Recall, PROCEDURE DATE:  05/27/2012 PROCEDURE:  Colonoscopy with snare polypectomy x 1 ASA CLASS:  Class II INDICATIONS:  history of pre-cancerous (adenomatous) colon polyps, surveillance and high-risk screening ; index 06-2005 w/ TAs (one 12mm) MEDICATIONS:   MAC sedation, administered by CRNA, propofol (Diprivan) 300 mg IV  DESCRIPTION OF PROCEDURE:   After the risks benefits and alternatives of the procedure were thoroughly explained, informed consent was obtained.  Digital rectal exam was performed and revealed no abnormalities.   The LB CF-[id number] endoscope was introduced through the anus and advanced to the cecum, which was identified by both the appendix and ileocecal valve, without limitations.  The quality of the prep was adequate, using MoviPrep.  The instrument was then slowly withdrawn as the colon was fully examined. <<PROCEDUREIMAGES>>  FINDINGS:  A diminutive polyp was found in the ascending colon and snared without cautery. Retrieval was successful.  Mild diverticulosis was found in the left colon.  Otherwise normal colonoscopy without other polyps, masses, vascular ectasias, or inflammatory changes.   Retroflexed views in the rectum revealed no abnormalities.    The time to cecum = 4:11  minutes. The scope was then withdrawn in  18:45  minutes from the cecum and the procedure completed.  COMPLICATIONS:  None  ENDOSCOPIC IMPRESSION: 1) Diminutive polyp in the ascending colon - removed 2) Mild diverticulosis in the left colon 3) Otherwise normal colonoscopy  RECOMMENDATIONS: 1) Follow up colonoscopy in 5 years  ______________________________ Wilhemina Bonito. Eda Keys, MD  CC:  Creola Corn, MD;  The  Patient  n. eSIGNED:   Wilhemina Bonito. Eda Keys at 05/27/2012 09:57 AM  [place], Maisie Fus, [phone]
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
## 2012-05-28 ENCOUNTER — Telehealth: Payer: Self-pay | Admitting: *Deleted

## 2012-05-28 NOTE — Telephone Encounter (Signed)
  Follow up Call-  Call back number 05/27/2012  Post procedure Call Back phone  # [phone]  Permission to leave phone message Yes     Patient questions:  Do you have a fever, pain , or abdominal swelling? no Pain Score  0 *  Have you tolerated food without any problems? yes  Have you been able to return to your normal activities? yes  Do you have any questions about your discharge instructions: Diet   no Medications  no Follow up visit  no  Do you have questions or concerns about your Care? no  Actions: * If pain score is 4 or above: No action needed, pain <4.  Everything is fine.

## 2012-05-31 ENCOUNTER — Encounter: Payer: Self-pay | Admitting: Internal Medicine

## 2013-03-17 ENCOUNTER — Other Ambulatory Visit: Payer: Self-pay | Admitting: Orthopedic Surgery

## 2013-03-17 DIAGNOSIS — M19012 Primary osteoarthritis, left shoulder: Secondary | ICD-10-CM

## 2013-03-19 ENCOUNTER — Ambulatory Visit
Admission: RE | Admit: 2013-03-19 | Discharge: 2013-03-19 | Disposition: A | Discharge status: Home / Self Care | Payer: Medicare Other | Source: Ambulatory Visit | Attending: Orthopedic Surgery | Admitting: Orthopedic Surgery

## 2013-03-19 DIAGNOSIS — M19012 Primary osteoarthritis, left shoulder: Secondary | ICD-10-CM

## 2013-04-11 ENCOUNTER — Other Ambulatory Visit: Payer: Self-pay | Admitting: Orthopedic Surgery

## 2013-04-16 ENCOUNTER — Other Ambulatory Visit: Payer: Self-pay | Admitting: Orthopedic Surgery

## 2013-04-23 ENCOUNTER — Encounter (HOSPITAL_COMMUNITY): Payer: Self-pay | Admitting: Pharmacy Technician

## 2013-04-25 ENCOUNTER — Encounter (HOSPITAL_COMMUNITY): Payer: Self-pay

## 2013-04-25 ENCOUNTER — Encounter (HOSPITAL_COMMUNITY)
Admission: RE | Admit: 2013-04-25 | Discharge: 2013-04-25 | Disposition: A | Discharge status: Home / Self Care | Payer: Medicare Other | Source: Ambulatory Visit | Attending: Orthopedic Surgery | Admitting: Orthopedic Surgery

## 2013-04-25 HISTORY — DX: Malignant (primary) neoplasm, unspecified: C80.1

## 2013-04-25 LAB — CBC WITH DIFFERENTIAL/PLATELET
Basophils Absolute: 0 10*3/uL (ref 0.0–0.1)
Basophils Relative: 1 % (ref 0–1)
Eosinophils Absolute: 0.2 10*3/uL (ref 0.0–0.7)
Eosinophils Relative: 2 % (ref 0–5)
MCH: 31 pg (ref 26.0–34.0)
MCHC: 33.9 g/dL (ref 30.0–36.0)
MCV: 91.5 fL (ref 78.0–100.0)
Platelets: 186 10*3/uL (ref 150–400)
RDW: 14.3 % (ref 11.5–15.5)
WBC: 8.2 10*3/uL (ref 4.0–10.5)

## 2013-04-25 LAB — URINALYSIS, ROUTINE W REFLEX MICROSCOPIC
Glucose, UA: NEGATIVE mg/dL
Leukocytes, UA: NEGATIVE
Nitrite: NEGATIVE
Protein, ur: 30 mg/dL — AB
pH: 6 (ref 5.0–8.0)

## 2013-04-25 LAB — SURGICAL PCR SCREEN: MRSA, PCR: NEGATIVE

## 2013-04-25 LAB — URINE MICROSCOPIC-ADD ON

## 2013-04-25 LAB — COMPREHENSIVE METABOLIC PANEL
ALT: 16 U/L (ref 0–53)
AST: 18 U/L (ref 0–37)
Calcium: 10 mg/dL (ref 8.4–10.5)
Creatinine, Ser: 1.24 mg/dL (ref 0.50–1.35)
GFR calc Af Amer: 64 mL/min — ABNORMAL LOW (ref >90)
Sodium: 140 mEq/L (ref 135–145)
Total Protein: 7.6 g/dL (ref 6.0–8.3)

## 2013-04-25 LAB — PROTIME-INR: INR: 0.98 (ref 0.00–1.49)

## 2013-04-25 NOTE — Pre-Procedure Instructions (Signed)
Shell Yandow Bless  04/25/2013   Your procedure is scheduled on:  Tuesday May 13,2014  Report to [facility] at 1045 AM.  Call this number if you have problems the morning of surgery: [phone]   Remember:   Do not eat food or drink liquids after midnight.Monday   Take these medicines the morning of surgery with A SIP OF WATER: None. Stop Aspirin and all Herbal meds 5 days prior surgery.   Do not wear jewelry.  Do not wear lotions, powders, or perfumes. You may wear deodorant.  Do not shave 48 hours prior to surgery. Men may shave face and neck.  Do not bring valuables to the hospital.  Contacts, dentures or bridgework may not be worn into surgery.  Leave suitcase in the car. After surgery it may be brought to your room.  For patients admitted to the hospital, checkout time is 11:00 AM the day of  discharge.   Patients discharged the day of surgery will not be allowed to drive  home.    Special Instructions: Incentive Spirometry - Practice and bring it with you on the day of surgery. Shower using CHG 2 nights before surgery and the night before surgery.  If you shower the day of surgery use CHG.  Use special wash - you have one bottle of CHG for all showers.  You should use approximately 1/3 of the bottle for each shower.   Please read over the following fact sheets that you were given: Pain Booklet, Coughing and Deep Breathing, Blood Transfusion Information, MRSA Information and Surgical Site Infection Prevention

## 2013-04-28 MED ORDER — DEXTROSE 5 % IV SOLN
3.0000 g | INTRAVENOUS | Status: AC
  Administered 2013-04-29: 3 g via INTRAVENOUS
  Filled 2013-04-28 (×4): qty 3000

## 2013-04-28 NOTE — Progress Notes (Signed)
This is a 77 year old male who is scheduled for left shoulder reverse shoulder replacement with removal of screws to be performed  By Dr. Jones Broom on 29 Apr 2013. Lab results dated 25 Apr 2013 reveals proteinuria 30 mg/dl (negative). A review of the chart reveals a history of proteinuria in June/July/September 2009. Serum creatinine has remained stable since August 2009.  An EKG (unconfirmed) dated 25 Apr 2013 showed sinus rhythm (ventricular rate 75) with 1st degree AV block (PR interval 242 ms) otherwise normal EKG. Past medical history is significant for hyperlipidemia which is managed with Zocor. May proceed with surgery as scheduled.

## 2013-04-29 ENCOUNTER — Encounter (HOSPITAL_COMMUNITY): Payer: Self-pay | Admitting: *Deleted

## 2013-04-29 ENCOUNTER — Inpatient Hospital Stay (HOSPITAL_COMMUNITY)
Admission: RE | Admit: 2013-04-29 | Discharge: 2013-04-30 | DRG: 484 | Disposition: A | Discharge status: Home / Self Care | Payer: Medicare Other | Source: Ambulatory Visit | Attending: Orthopedic Surgery | Admitting: Orthopedic Surgery

## 2013-04-29 ENCOUNTER — Encounter (HOSPITAL_COMMUNITY): Payer: Self-pay | Admitting: Anesthesiology

## 2013-04-29 ENCOUNTER — Inpatient Hospital Stay (HOSPITAL_COMMUNITY): Payer: Medicare Other

## 2013-04-29 ENCOUNTER — Inpatient Hospital Stay (HOSPITAL_COMMUNITY): Payer: Medicare Other | Admitting: Critical Care Medicine

## 2013-04-29 ENCOUNTER — Encounter (HOSPITAL_COMMUNITY)
Admission: RE | Disposition: A | Discharge status: Home / Self Care | Payer: Self-pay | Source: Ambulatory Visit | Attending: Orthopedic Surgery

## 2013-04-29 DIAGNOSIS — Z87891 Personal history of nicotine dependence: Secondary | ICD-10-CM

## 2013-04-29 DIAGNOSIS — E7800 Pure hypercholesterolemia, unspecified: Secondary | ICD-10-CM | POA: Diagnosis present

## 2013-04-29 DIAGNOSIS — M19019 Primary osteoarthritis, unspecified shoulder: Principal | ICD-10-CM | POA: Diagnosis present

## 2013-04-29 DIAGNOSIS — M75100 Unspecified rotator cuff tear or rupture of unspecified shoulder, not specified as traumatic: Secondary | ICD-10-CM

## 2013-04-29 DIAGNOSIS — Z79899 Other long term (current) drug therapy: Secondary | ICD-10-CM

## 2013-04-29 DIAGNOSIS — Z8551 Personal history of malignant neoplasm of bladder: Secondary | ICD-10-CM

## 2013-04-29 DIAGNOSIS — Z01812 Encounter for preprocedural laboratory examination: Secondary | ICD-10-CM

## 2013-04-29 DIAGNOSIS — Z8249 Family history of ischemic heart disease and other diseases of the circulatory system: Secondary | ICD-10-CM

## 2013-04-29 DIAGNOSIS — Z96612 Presence of left artificial shoulder joint: Secondary | ICD-10-CM

## 2013-04-29 DIAGNOSIS — M12819 Other specific arthropathies, not elsewhere classified, unspecified shoulder: Secondary | ICD-10-CM

## 2013-04-29 DIAGNOSIS — Z7982 Long term (current) use of aspirin: Secondary | ICD-10-CM

## 2013-04-29 HISTORY — PX: HARDWARE REMOVAL: SHX979

## 2013-04-29 HISTORY — PX: REVERSE SHOULDER ARTHROPLASTY: SHX5054

## 2013-04-29 LAB — TYPE AND SCREEN

## 2013-04-29 SURGERY — ARTHROPLASTY, SHOULDER, TOTAL, REVERSE
Anesthesia: General | Site: Shoulder | Laterality: Left | Wound class: Clean

## 2013-04-29 MED ORDER — OXYCODONE-ACETAMINOPHEN 5-325 MG PO TABS: 1.0000 | ORAL_TABLET | ORAL | Status: DC | PRN

## 2013-04-29 MED ORDER — ASPIRIN EC 325 MG PO TBEC
325.0000 mg | DELAYED_RELEASE_TABLET | Freq: Two times a day (BID) | ORAL | Status: DC
  Administered 2013-04-29 – 2013-04-30 (×2): 325 mg via ORAL
  Filled 2013-04-29 (×4): qty 1

## 2013-04-29 MED ORDER — ROCURONIUM BROMIDE 100 MG/10ML IV SOLN
INTRAVENOUS | Status: DC | PRN
  Administered 2013-04-29: 50 mg via INTRAVENOUS

## 2013-04-29 MED ORDER — LIDOCAINE HCL 4 % MT SOLN
OROMUCOSAL | Status: DC | PRN
  Administered 2013-04-29: 4 mL via TOPICAL

## 2013-04-29 MED ORDER — OXYCODONE HCL 5 MG/5ML PO SOLN: 5.0000 mg | Freq: Once | ORAL | Status: DC | PRN

## 2013-04-29 MED ORDER — PHENYLEPHRINE HCL 10 MG/ML IJ SOLN
10.0000 mg | INTRAVENOUS | Status: DC | PRN
  Administered 2013-04-29: 11:00:00 via INTRAVENOUS
  Administered 2013-04-29: 10 ug/min via INTRAVENOUS

## 2013-04-29 MED ORDER — SODIUM CHLORIDE 0.9 % IV SOLN: INTRAVENOUS | Status: DC

## 2013-04-29 MED ORDER — FLEET ENEMA 7-19 GM/118ML RE ENEM: 1.0000 | ENEMA | Freq: Once | RECTAL | Status: AC | PRN

## 2013-04-29 MED ORDER — ONDANSETRON HCL 4 MG/2ML IJ SOLN
INTRAMUSCULAR | Status: DC | PRN
  Administered 2013-04-29: 4 mg via INTRAVENOUS

## 2013-04-29 MED ORDER — SODIUM CHLORIDE 0.9 % IV SOLN
INTRAVENOUS | Status: DC
  Administered 2013-04-29 – 2013-04-30 (×2): via INTRAVENOUS

## 2013-04-29 MED ORDER — METOCLOPRAMIDE HCL 5 MG/ML IJ SOLN: 5.0000 mg | Freq: Three times a day (TID) | INTRAMUSCULAR | Status: DC | PRN

## 2013-04-29 MED ORDER — METOCLOPRAMIDE HCL 10 MG PO TABS: 5.0000 mg | ORAL_TABLET | Freq: Three times a day (TID) | ORAL | Status: DC | PRN

## 2013-04-29 MED ORDER — ACETAMINOPHEN 325 MG PO TABS: 650.0000 mg | ORAL_TABLET | Freq: Four times a day (QID) | ORAL | Status: DC | PRN

## 2013-04-29 MED ORDER — ONDANSETRON HCL 4 MG/2ML IJ SOLN: 4.0000 mg | Freq: Four times a day (QID) | INTRAMUSCULAR | Status: DC | PRN

## 2013-04-29 MED ORDER — HYDROMORPHONE HCL PF 1 MG/ML IJ SOLN: 0.2500 mg | INTRAMUSCULAR | Status: DC | PRN

## 2013-04-29 MED ORDER — HYDROCODONE-ACETAMINOPHEN 5-325 MG PO TABS
1.0000 | ORAL_TABLET | ORAL | Status: DC | PRN
  Administered 2013-04-30: 1 via ORAL
  Filled 2013-04-29: qty 1

## 2013-04-29 MED ORDER — MORPHINE SULFATE 2 MG/ML IJ SOLN: 1.0000 mg | INTRAMUSCULAR | Status: DC | PRN

## 2013-04-29 MED ORDER — ALBUMIN HUMAN 5 % IV SOLN
INTRAVENOUS | Status: DC | PRN
  Administered 2013-04-29: 10:00:00 via INTRAVENOUS

## 2013-04-29 MED ORDER — DOCUSATE SODIUM 100 MG PO CAPS
100.0000 mg | ORAL_CAPSULE | Freq: Two times a day (BID) | ORAL | Status: DC
  Administered 2013-04-29 – 2013-04-30 (×2): 100 mg via ORAL
  Filled 2013-04-29 (×3): qty 1

## 2013-04-29 MED ORDER — MIDAZOLAM HCL 5 MG/5ML IJ SOLN
INTRAMUSCULAR | Status: DC | PRN
  Administered 2013-04-29: 2 mg via INTRAVENOUS

## 2013-04-29 MED ORDER — POVIDONE-IODINE 7.5 % EX SOLN: Freq: Once | CUTANEOUS | Status: DC

## 2013-04-29 MED ORDER — SIMVASTATIN 40 MG PO TABS
40.0000 mg | ORAL_TABLET | Freq: Every day | ORAL | Status: DC
  Filled 2013-04-29 (×3): qty 1

## 2013-04-29 MED ORDER — LACTATED RINGERS IV SOLN
INTRAVENOUS | Status: DC | PRN
  Administered 2013-04-29 (×2): via INTRAVENOUS

## 2013-04-29 MED ORDER — POLYETHYLENE GLYCOL 3350 17 G PO PACK: 17.0000 g | PACK | Freq: Every day | ORAL | Status: DC | PRN

## 2013-04-29 MED ORDER — OXYCODONE HCL 5 MG PO TABS: 5.0000 mg | ORAL_TABLET | Freq: Once | ORAL | Status: DC | PRN

## 2013-04-29 MED ORDER — ALUMINUM HYDROXIDE GEL 320 MG/5ML PO SUSP: 15.0000 mL | ORAL | Status: DC | PRN

## 2013-04-29 MED ORDER — SODIUM CHLORIDE 0.9 % IR SOLN
Status: DC | PRN
  Administered 2013-04-29: 3000 mL

## 2013-04-29 MED ORDER — ARTIFICIAL TEARS OP OINT
TOPICAL_OINTMENT | OPHTHALMIC | Status: DC | PRN
  Administered 2013-04-29: 1 via OPHTHALMIC

## 2013-04-29 MED ORDER — PROPOFOL 10 MG/ML IV BOLUS
INTRAVENOUS | Status: DC | PRN
  Administered 2013-04-29: 130 mg via INTRAVENOUS

## 2013-04-29 MED ORDER — GLYCOPYRROLATE 0.2 MG/ML IJ SOLN
INTRAMUSCULAR | Status: DC | PRN
  Administered 2013-04-29: 0.4 mg via INTRAVENOUS

## 2013-04-29 MED ORDER — FENTANYL CITRATE 0.05 MG/ML IJ SOLN
INTRAMUSCULAR | Status: DC | PRN
  Administered 2013-04-29: 100 ug via INTRAVENOUS
  Administered 2013-04-29: 50 ug via INTRAVENOUS

## 2013-04-29 MED ORDER — OXYCODONE HCL 5 MG PO TABS
5.0000 mg | ORAL_TABLET | ORAL | Status: DC | PRN
  Administered 2013-04-29: 10 mg via ORAL
  Filled 2013-04-29: qty 2

## 2013-04-29 MED ORDER — DIPHENHYDRAMINE HCL 12.5 MG/5ML PO ELIX: 12.5000 mg | ORAL_SOLUTION | ORAL | Status: DC | PRN

## 2013-04-29 MED ORDER — PHENOL 1.4 % MT LIQD: 1.0000 | OROMUCOSAL | Status: DC | PRN

## 2013-04-29 MED ORDER — BISACODYL 5 MG PO TBEC: 5.0000 mg | DELAYED_RELEASE_TABLET | Freq: Every day | ORAL | Status: DC | PRN

## 2013-04-29 MED ORDER — SODIUM CHLORIDE 0.9 % IR SOLN
Status: DC | PRN
  Administered 2013-04-29: 1000 mL

## 2013-04-29 MED ORDER — NEOSTIGMINE METHYLSULFATE 1 MG/ML IJ SOLN
INTRAMUSCULAR | Status: DC | PRN
  Administered 2013-04-29: 3 mg via INTRAVENOUS

## 2013-04-29 MED ORDER — MENTHOL 3 MG MT LOZG: 1.0000 | LOZENGE | OROMUCOSAL | Status: DC | PRN

## 2013-04-29 MED ORDER — DEXAMETHASONE SODIUM PHOSPHATE 4 MG/ML IJ SOLN
INTRAMUSCULAR | Status: DC | PRN
  Administered 2013-04-29: 10 mg

## 2013-04-29 MED ORDER — CEFAZOLIN SODIUM-DEXTROSE 2-3 GM-% IV SOLR
2.0000 g | Freq: Four times a day (QID) | INTRAVENOUS | Status: AC
  Administered 2013-04-29 – 2013-04-30 (×3): 2 g via INTRAVENOUS
  Filled 2013-04-29 (×3): qty 50

## 2013-04-29 MED ORDER — ONDANSETRON HCL 4 MG PO TABS: 4.0000 mg | ORAL_TABLET | Freq: Four times a day (QID) | ORAL | Status: DC | PRN

## 2013-04-29 MED ORDER — ZOLPIDEM TARTRATE 5 MG PO TABS
5.0000 mg | ORAL_TABLET | Freq: Every evening | ORAL | Status: DC | PRN
  Administered 2013-04-29: 5 mg via ORAL
  Filled 2013-04-29: qty 1

## 2013-04-29 MED ORDER — ACETAMINOPHEN 650 MG RE SUPP: 650.0000 mg | Freq: Four times a day (QID) | RECTAL | Status: DC | PRN

## 2013-04-29 MED ORDER — PROMETHAZINE HCL 25 MG/ML IJ SOLN: 6.2500 mg | INTRAMUSCULAR | Status: DC | PRN

## 2013-04-29 MED ORDER — ACETAMINOPHEN 10 MG/ML IV SOLN
1000.0000 mg | Freq: Four times a day (QID) | INTRAVENOUS | Status: DC
  Administered 2013-04-29 – 2013-04-30 (×3): 1000 mg via INTRAVENOUS
  Filled 2013-04-29 (×4): qty 100

## 2013-04-29 MED ORDER — BUPIVACAINE-EPINEPHRINE PF 0.5-1:200000 % IJ SOLN
INTRAMUSCULAR | Status: DC | PRN
  Administered 2013-04-29: 150 mg

## 2013-04-29 SURGICAL SUPPLY — 94 items
BANDAGE ELASTIC 6 VELCRO ST LF (GAUZE/BANDAGES/DRESSINGS) IMPLANT
BANDAGE ESMARK 6X9 LF (GAUZE/BANDAGES/DRESSINGS) ×1 IMPLANT
BIT DRILL 170X2.5X (BIT) IMPLANT
BIT DRL 170X2.5X (BIT)
BLADE SAW SAG 73X25 THK (BLADE) ×1
BLADE SAW SGTL 73X25 THK (BLADE) ×1 IMPLANT
BLADE SURG 10 STRL SS (BLADE) ×2 IMPLANT
BLADE SURG ROTATE 9660 (MISCELLANEOUS) IMPLANT
BNDG CMPR 9X6 STRL LF SNTH (GAUZE/BANDAGES/DRESSINGS)
BNDG ESMARK 6X9 LF (GAUZE/BANDAGES/DRESSINGS)
BOWL SMART MIX CTS (DISPOSABLE) IMPLANT
CHLORAPREP W/TINT 26ML (MISCELLANEOUS) ×3 IMPLANT
CLOTH BEACON ORANGE TIMEOUT ST (SAFETY) ×2 IMPLANT
CLSR STERI-STRIP ANTIMIC 1/2X4 (GAUZE/BANDAGES/DRESSINGS) ×2 IMPLANT
COVER MAYO STAND STRL (DRAPES) ×2 IMPLANT
COVER SURGICAL LIGHT HANDLE (MISCELLANEOUS) ×2 IMPLANT
CUFF TOURNIQUET SINGLE 34IN LL (TOURNIQUET CUFF) IMPLANT
CUFF TOURNIQUET SINGLE 44IN (TOURNIQUET CUFF) IMPLANT
DRAPE INCISE IOBAN 66X45 STRL (DRAPES) ×2 IMPLANT
DRAPE OEC MINIVIEW 54X84 (DRAPES) IMPLANT
DRAPE SURG 17X23 STRL (DRAPES) ×2 IMPLANT
DRAPE U-SHAPE 47X51 STRL (DRAPES) ×2 IMPLANT
DRILL 2.5 (BIT)
DRSG ADAPTIC 3X8 NADH LF (GAUZE/BANDAGES/DRESSINGS) ×1 IMPLANT
DRSG MEPILEX BORDER 4X12 (GAUZE/BANDAGES/DRESSINGS) ×1 IMPLANT
DRSG MEPILEX BORDER 4X4 (GAUZE/BANDAGES/DRESSINGS) ×1 IMPLANT
DRSG PAD ABDOMINAL 8X10 ST (GAUZE/BANDAGES/DRESSINGS) ×2 IMPLANT
ELECT BLADE 4.0 EZ CLEAN MEGAD (MISCELLANEOUS)
ELECT REM PT RETURN 9FT ADLT (ELECTROSURGICAL) ×2
ELECTRODE BLDE 4.0 EZ CLN MEGD (MISCELLANEOUS) IMPLANT
ELECTRODE REM PT RTRN 9FT ADLT (ELECTROSURGICAL) ×1 IMPLANT
EVACUATOR 1/8 PVC DRAIN (DRAIN) ×1 IMPLANT
GAUZE XEROFORM 1X8 LF (GAUZE/BANDAGES/DRESSINGS) IMPLANT
GLENOSPHERE ECC D42MM 42 (Orthopedic Implant) ×1 IMPLANT
GLOVE BIO SURGEON STRL SZ7 (GLOVE) ×4 IMPLANT
GLOVE BIO SURGEON STRL SZ7.5 (GLOVE) ×4 IMPLANT
GLOVE BIOGEL PI IND STRL 7.0 (GLOVE) ×1 IMPLANT
GLOVE BIOGEL PI IND STRL 8 (GLOVE) ×1 IMPLANT
GLOVE BIOGEL PI INDICATOR 7.0 (GLOVE) ×1
GLOVE BIOGEL PI INDICATOR 8 (GLOVE) ×2
GOWN PREVENTION PLUS LG XLONG (DISPOSABLE) ×2 IMPLANT
GOWN PREVENTION PLUS XLARGE (GOWN DISPOSABLE) ×2 IMPLANT
GOWN STRL NON-REIN LRG LVL3 (GOWN DISPOSABLE) ×4 IMPLANT
HANDPIECE INTERPULSE COAX TIP (DISPOSABLE) ×2
HEMOSTAT SURGICEL 2X14 (HEMOSTASIS) IMPLANT
HOOD PEEL AWAY FACE SHEILD DIS (HOOD) ×4 IMPLANT
KIT BASIN OR (CUSTOM PROCEDURE TRAY) ×2 IMPLANT
KIT ROOM TURNOVER OR (KITS) ×2 IMPLANT
MANIFOLD NEPTUNE II (INSTRUMENTS) ×2 IMPLANT
NDL HYPO 25GX1X1/2 BEV (NEEDLE) ×1 IMPLANT
NDL MAYO TROCAR (NEEDLE) ×1 IMPLANT
NEEDLE HYPO 25GX1X1/2 BEV (NEEDLE) IMPLANT
NEEDLE MAYO TROCAR (NEEDLE) ×2 IMPLANT
NOZZLE PRISM 8.5MM (MISCELLANEOUS) IMPLANT
NS IRRIG 1000ML POUR BTL (IV SOLUTION) ×2 IMPLANT
PACK ORTHO EXTREMITY (CUSTOM PROCEDURE TRAY) ×2 IMPLANT
PACK SHOULDER (CUSTOM PROCEDURE TRAY) ×2 IMPLANT
PAD ARMBOARD 7.5X6 YLW CONV (MISCELLANEOUS) ×3 IMPLANT
PAD CAST 4YDX4 CTTN HI CHSV (CAST SUPPLIES) IMPLANT
PADDING CAST COTTON 4X4 STRL (CAST SUPPLIES)
PIN GUIDE 1.2 (PIN) IMPLANT
PIN GUIDE GLENOPHERE 1.5MX300M (PIN) IMPLANT
PIN METAGLENE 2.5 (PIN) IMPLANT
RETRIEVER SUT HEWSON (MISCELLANEOUS) IMPLANT
SET HNDPC FAN SPRY TIP SCT (DISPOSABLE) IMPLANT
SLING ARM IMMOBILIZER LRG (SOFTGOODS) ×2 IMPLANT
SLING ARM IMMOBILIZER MED (SOFTGOODS) IMPLANT
SPONGE GAUZE 4X4 12PLY (GAUZE/BANDAGES/DRESSINGS) ×1 IMPLANT
SPONGE LAP 18X18 X RAY DECT (DISPOSABLE) ×2 IMPLANT
SPONGE LAP 4X18 X RAY DECT (DISPOSABLE) ×4 IMPLANT
STAPLER VISISTAT 35W (STAPLE) IMPLANT
STRIP CLOSURE SKIN 1/2X4 (GAUZE/BANDAGES/DRESSINGS) ×1 IMPLANT
SUCTION FRAZIER TIP 10 FR DISP (SUCTIONS) ×2 IMPLANT
SUPPORT WRAP ARM LG (MISCELLANEOUS) ×2 IMPLANT
SUT ETHIBOND 2 OS 4 DA (SUTURE) ×4 IMPLANT
SUT ETHIBOND NAB CT1 #1 30IN (SUTURE) ×3 IMPLANT
SUT ETHILON 4 0 PS 2 18 (SUTURE) IMPLANT
SUT FIBERWIRE #2 38 T-5 BLUE (SUTURE) ×2
SUT MNCRL AB 4-0 PS2 18 (SUTURE) ×3 IMPLANT
SUT SILK 2 0 TIES 17X18 (SUTURE)
SUT SILK 2-0 18XBRD TIE BLK (SUTURE) ×1 IMPLANT
SUT VIC AB 0 CTB1 27 (SUTURE) ×1 IMPLANT
SUT VIC AB 2-0 CT1 27 (SUTURE) ×4
SUT VIC AB 2-0 CT1 TAPERPNT 27 (SUTURE) ×2 IMPLANT
SUT VIC AB 2-0 FS1 27 (SUTURE) IMPLANT
SUTURE FIBERWR #2 38 T-5 BLUE (SUTURE) IMPLANT
SYR CONTROL 10ML LL (SYRINGE) ×1 IMPLANT
SYRINGE TOOMEY DISP (SYRINGE) IMPLANT
TOWEL OR 17X24 6PK STRL BLUE (TOWEL DISPOSABLE) ×2 IMPLANT
TOWEL OR 17X26 10 PK STRL BLUE (TOWEL DISPOSABLE) ×2 IMPLANT
TRAY FOLEY CATH 14FR (SET/KITS/TRAYS/PACK) IMPLANT
TUBE CONNECTING 12X1/4 (SUCTIONS) ×1 IMPLANT
WATER STERILE IRR 1000ML POUR (IV SOLUTION) ×2 IMPLANT
YANKAUER SUCT BULB TIP NO VENT (SUCTIONS) ×2 IMPLANT

## 2013-04-29 NOTE — Transfer of Care (Signed)
Immediate Anesthesia Transfer of Care Note  Patient: [name]  Procedure(s) Performed: Procedure(s): LEFT SHOULDER REVERSE REPLACEMENT  (Left) REMOVAL OF Three SCREWS Left Humerus (Left)  Patient Location: PACU  Anesthesia Type:GA combined with regional for post-op pain  Level of Consciousness: awake, alert , oriented and patient cooperative  Airway & Oxygen Therapy: Patient Spontanous Breathing and Patient connected to nasal cannula oxygen  Post-op Assessment: Report given to PACU RN, Post -op Vital signs reviewed and stable and Patient moving all extremities  Post vital signs: Reviewed and stable  Complications: No apparent anesthesia complications

## 2013-04-29 NOTE — Anesthesia Procedure Notes (Addendum)
Anesthesia Regional Block:  Interscalene brachial plexus block  Pre-Anesthetic Checklist: ,, timeout performed, Correct Patient, Correct Site, Correct Laterality, Correct Procedure, Correct Position, site marked, Risks and benefits discussed,  Surgical consent,  Pre-op evaluation,  At surgeon's request and post-op pain management  Laterality: Left  Prep: chloraprep       Needles:  Injection technique: Single-shot  Needle Type: Echogenic Stimulator Needle     Needle Length: 5cm 5 cm Needle Gauge: 22 and 22 G    Additional Needles:  Procedures: ultrasound guided (picture in chart) and nerve stimulator Interscalene brachial plexus block  Nerve Stimulator or Paresthesia:  Response: bicep contraction, 0.45 mA,   Additional Responses:   Narrative:  Start time: 04/29/2013 8:14 AM End time: 04/29/2013 8:24 AM Injection made incrementally with aspirations every 5 mL.  Performed by: Personally  Anesthesiologist: J. Adonis Huguenin, MD  Additional Notes: Functioning IV was confirmed and monitors applied.  A 50mm 22ga echogenic arrow stimulator was used. Sterile prep and drape,hand hygiene and sterile gloves were used.Ultrasound guidance: relevant anatomy identified, needle position confirmed, local anesthetic spread visualized around nerve(s)., vascular puncture avoided.  Image printed for medical record.  Negative aspiration and negative test dose prior to incremental administration of local anesthetic. The patient tolerated the procedure well.  Interscalene brachial plexus block Procedure Name: Intubation Date/Time: 04/29/2013 8:40 AM Performed by: Orvilla Fus A Pre-anesthesia Checklist: Patient identified, Emergency Drugs available, Suction available, Patient being monitored and Timeout performed Patient Re-evaluated:Patient Re-evaluated prior to inductionOxygen Delivery Method: Circle system utilized Preoxygenation: Pre-oxygenation with 100% oxygen Intubation Type: IV  induction Ventilation: Mask ventilation without difficulty and Oral airway inserted - appropriate to patient size Laryngoscope Size: Mac and 4 Grade View: Grade I Tube type: Oral Tube size: 8.0 mm Number of attempts: 1 Airway Equipment and Method: Stylet and LTA kit utilized Placement Confirmation: ETT inserted through vocal cords under direct vision,  breath sounds checked- equal and bilateral and positive ETCO2 Secured at: 23 cm Tube secured with: Tape Dental Injury: Teeth and Oropharynx as per pre-operative assessment
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
## 2013-04-29 NOTE — Op Note (Signed)
Procedure(s): LEFT SHOULDER REVERSE REPLACEMENT  REMOVAL OF Three SCREWS Left Humerus Procedure Note  [name] male 77 y.o. 04/29/2013  Procedure(s) and Anesthesia Type:    * LEFT SHOULDER REVERSE TOTAL SHOULDER REPLACEMENT  - General    * REMOVAL OF Three SCREWS Left Humerus - General   Indications:  77 y.o. male  With endstage left shoulder arthritis with chronic rotator cuff tear with supraspinatus atrophy. He has significant deformity of the right upper extremity which limits his overhead function and would like to be able to raise his left arm overhead to improve his function. Pain was moderate not severe, but he felt that the potential improvement for her function would be worth the potential risks of surgery. We had a long discussion about this preoperatively. He understands that this implant overlies the deltoid muscle which has not been working for several years.     Surgeon: Mable Paris   Assistants: Damita Lack PA-C [facility] was present and scrubbed throughout the procedure and was essential in positioning, retraction, exposure, and closure)  Anesthesia: General endotracheal anesthesia with preoperative interscalene block    Procedure Detail  LEFT SHOULDER REVERSE REPLACEMENT , REMOVAL OF Three SCREWS Left Humerus   Estimated Blood Loss:  300 mL         Drains: 1 medium hemovac  Blood Given: none          Specimens: none        Complications:  * No complications entered in OR log *         Disposition: PACU - hemodynamically stable.         Condition: stable      OPERATIVE FINDINGS:  A DePuy press-fit  reverse total shoulder arthroplasty was placed with a  size 60 stem, a 38 glenosphere, and a +3-mm poly insert. The base plate  fixation was excellent.  PROCEDURE: The patient was identified in the preoperative holding area  where I personally marked the operative site after verifying site, side,  and procedure with the patient. An  interscalene block given by  the attending anesthesiologist in the holding area and the patient was taken back to the operating room where all extremities were  carefully padded in position after general anesthesia was induced. She  was placed in a beach-chair position and the operative upper extremity was  prepped and draped in a standard sterile fashion. An approximately 20-  cm incision was made from the tip of the coracoid process to the center  point of the humerus at the level of the axilla and extended distally to the previous incision from his humerus fracture. Dissection was carried  down through subcutaneous tissues to the level of the cephalic vein  which was taken laterally with the deltoid. The pectoralis major was  retracted medially. The subdeltoid space was developed and the lateral  edge of the conjoined tendon was identified. The undersurface of  conjoined tendon was palpated and the musculocutaneous nerve was not in  the field. Retractor was placed underneath the conjoined and second  retractor was placed lateral into the deltoid. Distal dissection was carried out to identify the proximal 3 lateral screws. These were each removed without difficulty. Fluoroscopic imaging was used to verify removal of the appropriate screws.  The circumflex humeral  artery and vessels were identified and clamped and coagulated. The  biceps tendon was tenodesed.  The subscapularis was tagged and taken off the lesser tuberosity in a sleeve.  The  joint was then  gently externally rotated while the capsule was released  from the humeral neck around to just beyond the 6 o'clock position. At  this point, the joint was dislocated and the humeral head was presented  into the wound. The excessive osteophyte formation was removed with a  large rongeur and the intramedullary canal was then entered with  sequential reamers up to a 16-mm reamer which was felt to be the  appropriate size. The cutting guide  was used to make the appropriate  head cut and the head was saved potentially bone grafting.  The proximal metaphyseal reaming guide was then placed and the appropriate size reamer was selected. The metaphyseal bone was then reamed. The trial implant was then placed. The trial was then left in place while the glenoid was exposed with the arm in an  abducted extended position. The anterior and posterior labrum were  completely excised and the capsule was released circumferentially to  allow for exposure of the glenoid for preparation. The guidepin was  placed using the guide in neutral angulation and the reamer was  placed over the guidepin to ream down to concentric surface. The inferior glenoid was preferentially reamed to allow some inferior tilt. Superior  hand reamer was used as well. The central drill hole was then made and  stayed within the glenoid vault. The Metaglene was then impacted with  Excellent press fit. The superior and inferior screws were then  drilled, measured, and filled with appropriate-sized locking screw  alternatively tightening top and bottom to bring the Metaglene down  tightly against the bone. Posterior and anterior screws were then placed. Fixation was excellent.  The glenosphere was then impacted over the [facility] taper and tightened down  with the screw. The central post apparently cross threaded and did not allow for appropriate fixation. The glenoid see her was removed and examined it was noted that the threads on the post had been stripped. Therefore the decision was made to use a new glenoid sphere. Given the severely tight nature of his shoulder this incision was made to go down to 38 glenosphere, eccentric.  The trial implant was then  placed in the proximal humerus and the poly trials were tested and the above implant was felt to be the most appropriate soft tissue tensioning with excellent stability and excellent range of motion. There was some tendency towards  impinging in abduction and therefore more of the medial osteophytes were removed. The, final humeral stem was placed press fit.  And then the trial polyethylene inserts were tested again and the above implant was felt to be the most appropriate for final insertion. The joint was reduced taken through full range of motion and felt to be stable. Soft tissue tension was tight but acceptable. A medium Hemovac drain was placed out underneath the deltoid prior to closure. The joint was then copiously irrigated with pulse  lavage and the wound was then closed. The subscapularis was repaired back to the lesser tuberosity with #2 FiberWire through bone tunnels.  Skin was closed with 2-0 Vicryl in a deep dermal layer and 4-0  Monocryl for skin closure. Steri-Strips were applied.terile  dressings were then applied as well as a sling. The patient was allowed  to awaken from general anesthesia, transferred to stretcher, and taken  to recovery room in stable condition.   POSTOPERATIVE PLAN: The patient will be kept in the hospital postoperatively  for pain control and therapy.
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
## 2013-04-29 NOTE — Preoperative (Signed)
Beta Blockers   Reason not to administer Beta Blockers:Not Applicable 

## 2013-04-29 NOTE — Progress Notes (Signed)
Orthopedic Tech Progress Note Patient Details:  [name] [facility] 18-Aug-1936 [id number]  Ortho Devices Type of Ortho Device: Abduction pillow Ortho Device/Splint Location: Shoulder abduction pillow delivered to the OR .   Shawnie Pons 04/29/2013, 12:33 PM

## 2013-04-29 NOTE — Progress Notes (Signed)
UR COMPLETED  

## 2013-04-30 ENCOUNTER — Encounter (HOSPITAL_COMMUNITY): Payer: Self-pay | Admitting: Orthopedic Surgery

## 2013-04-30 DIAGNOSIS — M75100 Unspecified rotator cuff tear or rupture of unspecified shoulder, not specified as traumatic: Secondary | ICD-10-CM

## 2013-04-30 DIAGNOSIS — M12819 Other specific arthropathies, not elsewhere classified, unspecified shoulder: Secondary | ICD-10-CM

## 2013-04-30 DIAGNOSIS — M19019 Primary osteoarthritis, unspecified shoulder: Secondary | ICD-10-CM | POA: Diagnosis present

## 2013-04-30 LAB — BASIC METABOLIC PANEL
Chloride: 105 mEq/L (ref 96–112)
GFR calc non Af Amer: 59 mL/min — ABNORMAL LOW (ref >90)
Glucose, Bld: 130 mg/dL — ABNORMAL HIGH (ref 70–99)
Potassium: 4.4 mEq/L (ref 3.5–5.1)
Sodium: 139 mEq/L (ref 135–145)

## 2013-04-30 LAB — CBC
HCT: 38.7 % — ABNORMAL LOW (ref 39.0–52.0)
Hemoglobin: 13.1 g/dL (ref 13.0–17.0)
MCHC: 33.9 g/dL (ref 30.0–36.0)
RBC: 4.26 MIL/uL (ref 4.22–5.81)
WBC: 17 10*3/uL — ABNORMAL HIGH (ref 4.0–10.5)

## 2013-04-30 MED ORDER — OXYCODONE-ACETAMINOPHEN 5-325 MG PO TABS: 1.0000 | ORAL_TABLET | ORAL | Status: DC | PRN

## 2013-04-30 NOTE — Anesthesia Postprocedure Evaluation (Signed)
Anesthesia Post Note  Patient: [name]  Procedure(s) Performed: Procedure(s) (LRB): LEFT SHOULDER REVERSE REPLACEMENT  (Left) REMOVAL OF Three SCREWS Left Humerus (Left)  Anesthesia type: general  Patient location: PACU  Post pain: Pain level controlled  Post assessment: Patient's Cardiovascular Status Stable  Last Vitals:  Filed Vitals:   04/30/13 0612  BP: 111/71  Pulse: 81  Temp: 36.7 C  Resp: 16    Post vital signs: Reviewed and stable  Level of consciousness: sedated  Complications: No apparent anesthesia complications

## 2013-04-30 NOTE — Progress Notes (Signed)
Patient discharged in stable condition via wheelchair. Discharge instructions and prescriptions were given and explained 

## 2013-04-30 NOTE — Progress Notes (Signed)
Orthopedic Tech Progress Note Patient Details:  [name] April 10, 1936 [id number]  Ortho Devices Type of Ortho Device: Abduction pillow Ortho Device/Splint Location: Replacement shoulder abduction pillow Ortho Device/Splint Interventions: Application   Cammer, Mickie Bail 04/30/2013, 12:32 PM
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
## 2013-04-30 NOTE — Progress Notes (Signed)
PATIENT ID: Malachi Pro Kochan   1 Day Post-Op Procedure(s) (LRB): LEFT SHOULDER REVERSE REPLACEMENT  (Left) REMOVAL OF Three SCREWS Left Humerus (Left)  Subjective: Feeling great today. Reports no pain after block wore off. Still has some tingling in thumb but otherwise no complaints or concerns.   Objective:  Filed Vitals:   04/30/13 0612  BP: 111/71  Pulse: 81  Temp: 98.1 F (36.7 C)  Resp: 16     Awake, alert, orientated L UE dressing c/d/i Drain removed with brisk bleeding from drain site, a pressure dressing was applied Wiggles fingers, numbness to touch on left thumb, otherwise intact to light touch, distally NVI Abduction pillow repositioned  Labs:   Recent Labs  04/30/13 0540  HGB 13.1   Recent Labs  04/30/13 0540  WBC 17.0*  RBC 4.26  HCT 38.7*  PLT 160   Recent Labs  04/30/13 0540  NA 139  K 4.4  CL 105  CO2 23  BUN 22  CREATININE 1.18  GLUCOSE 130*  CALCIUM 8.2*    Assessment and Plan: Cont to monitor pressure dressing for bleeding, okay to reapply new dressing if needed Will work with OT today on hand, wrist elbow rom, sling edu Okay for d/c home today  Percocet for home pain control, scripts in chart FU Dr[facility] in 2 weeks   VTE proph: ASA 325mg  BID, SCDs
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
## 2013-09-16 IMAGING — CT CT SHOULDER*L* W/O CM
1 of 6 series · 3 of 14 positions shown, 4 images · non-contrast
Comparison: None

CLINICAL DATA: Left shoulder pain.  Remote history of trauma.

CT OF THE LEFT SHOULDER WITHOUT CONTRAST
TECHNIQUE: Multidetector CT imaging was performed according to the
standard protocol. Multiplanar CT image reconstructions were also
generated.

[Series 4: shoulder bone · axial · 0.52mm/px · z∈[-312,-12]mm · 3 of 121 slices shown, 4 images]
[im 1/121  soft-tissue]
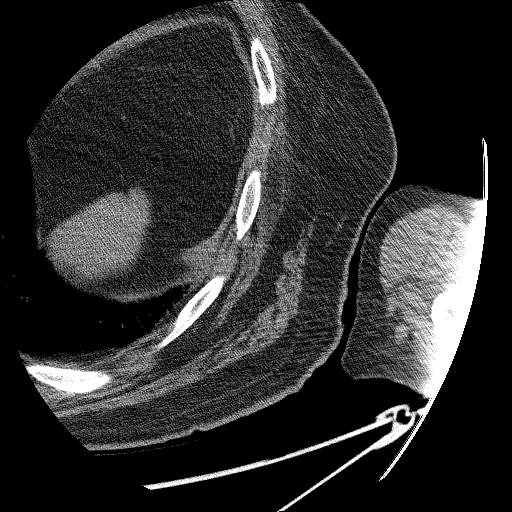
[im 1/121  bone]
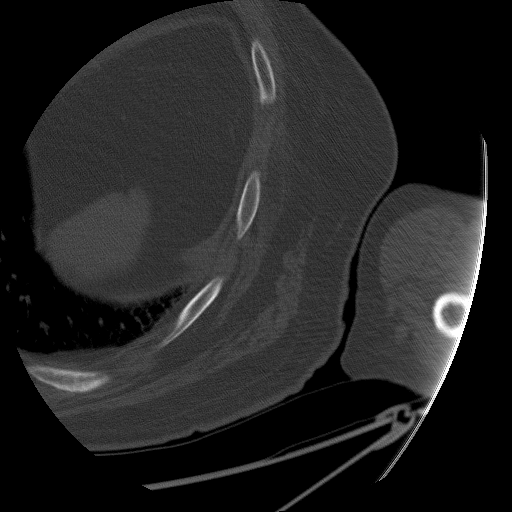
[im 61/121  bone]
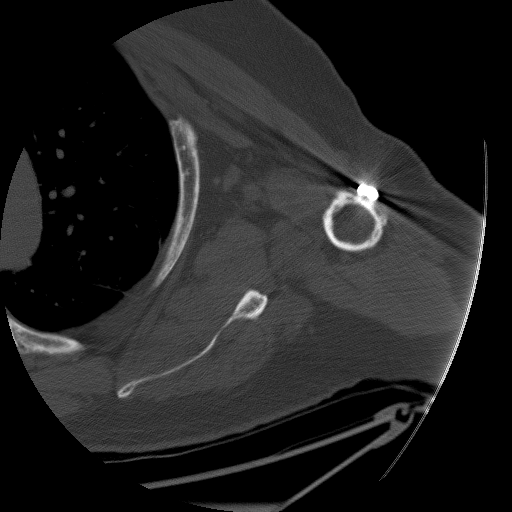
[im 121/121  bone]
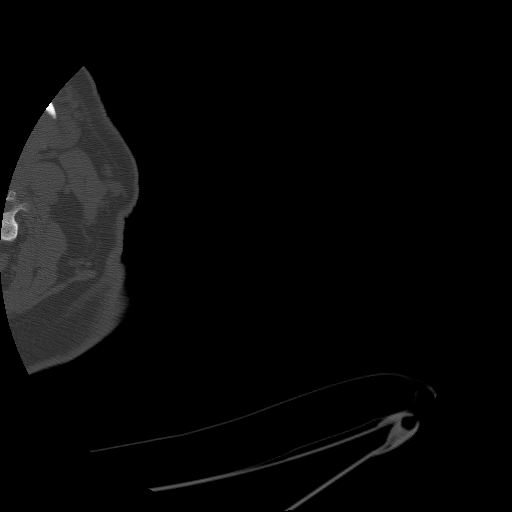

[3 of 14 positions shown; findings below may reference images not displayed]

FINDINGS: There are remote post-traumatic changes with a long plate
and numerous screws transfixing a remote healed humeral shaft
fracture.  A small bone anchors noted in the humeral neck and there
are surgical changes involving the AC joint and acromion.  Remote
post-traumatic changes likely related to distal clavicle fracture
and coracoclavicular ligament injuries.  The supraspinatus muscle
demonstrates fatty atrophy and the supraspinatus tendon may be
torn.  There is narrowing of the humeral acromial distance and the
humeral head is riding high in the glenoid fossa.

There are moderate glenohumeral joint degenerative changes with
joint space narrowing and osteophytic spurring.

Multiple remote left-sided rib fractures are noted some of which
contain hardware.
IMPRESSION: 1.  Evidence of remote post-traumatic changes with a healed humeral
shaft fracture with fixating hardware, multiple healed rib
fractures and remote clavicle fractures.
2.  Suspect previous AC joint surgery and acromioplasty.
3.  Moderate glenohumeral joint degenerative changes.
4.  Suspect supraspinatus tendon tear.

## 2013-10-23 IMAGING — CR DG CHEST 2V
2 series · 2 of 2 positions shown · non-contrast
Comparison: 09/26/2011

CLINICAL DATA: Preoperative evaluation for left shoulder
replacement

CHEST - 2 VIEW

[view not recorded (1 of 2)]
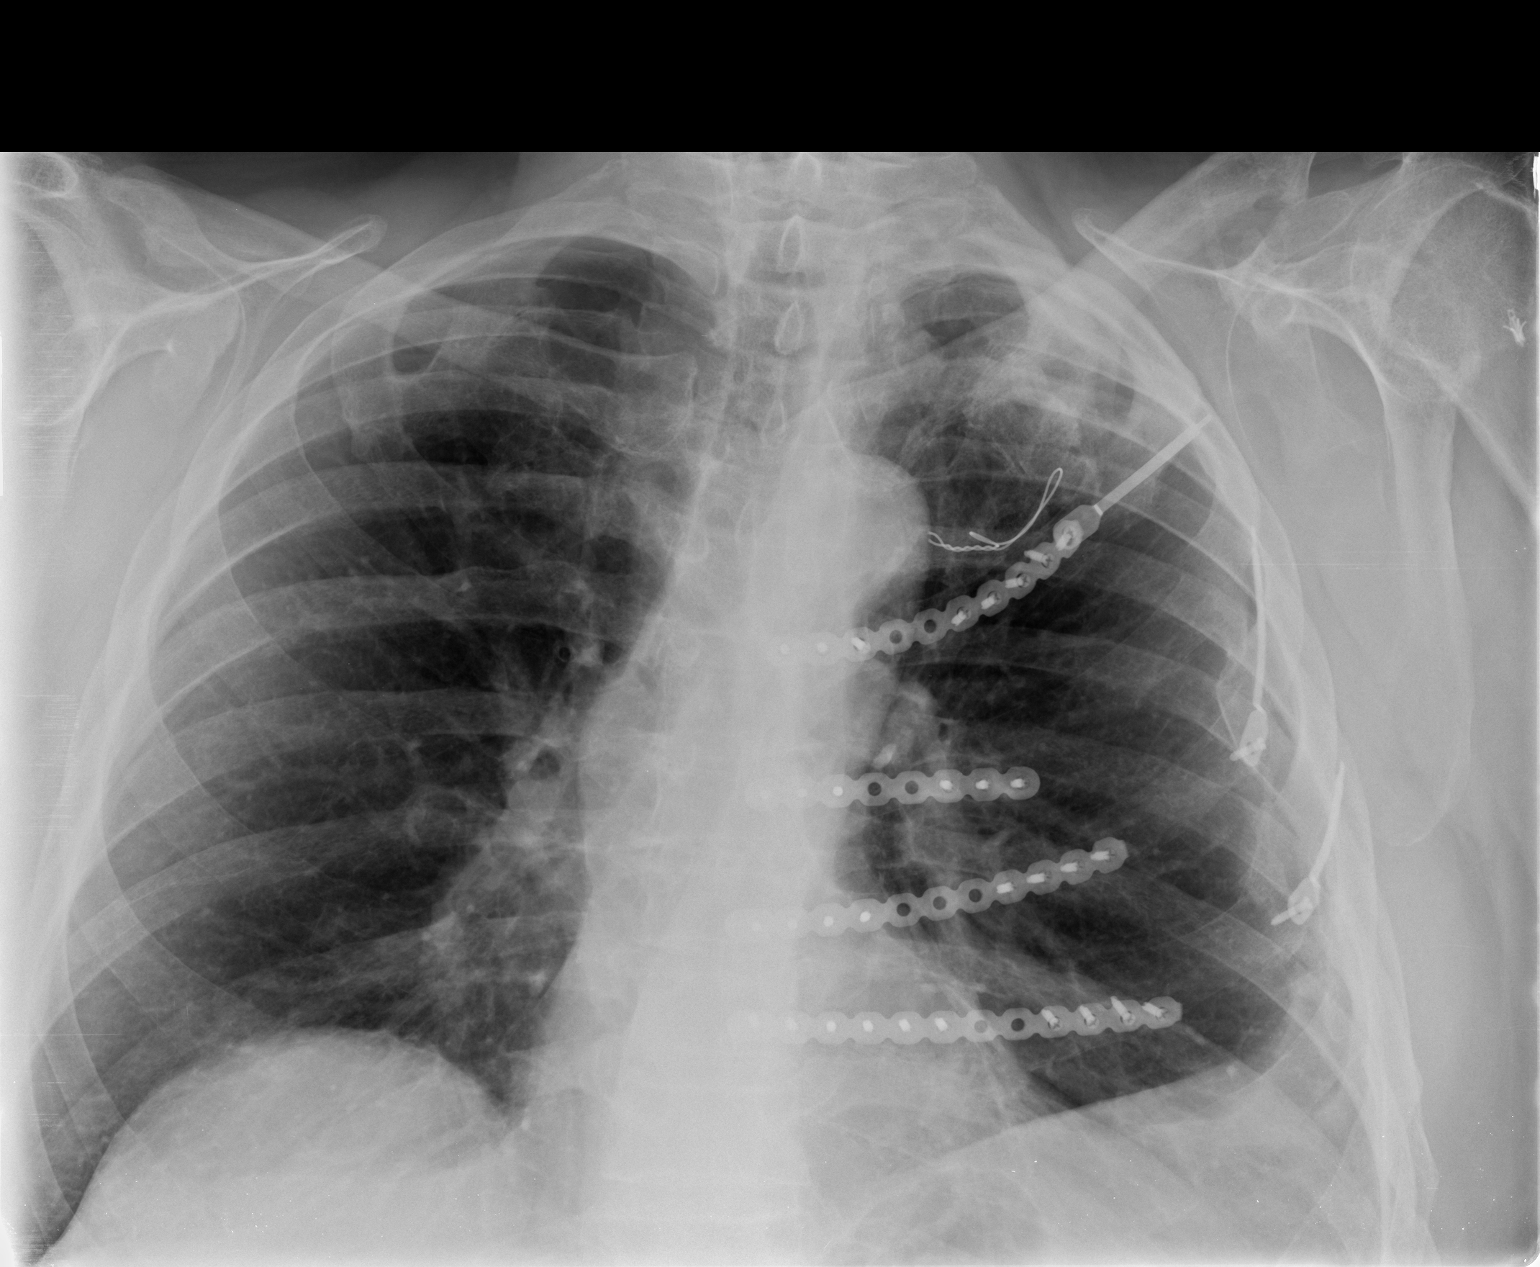

[view not recorded (2 of 2)]
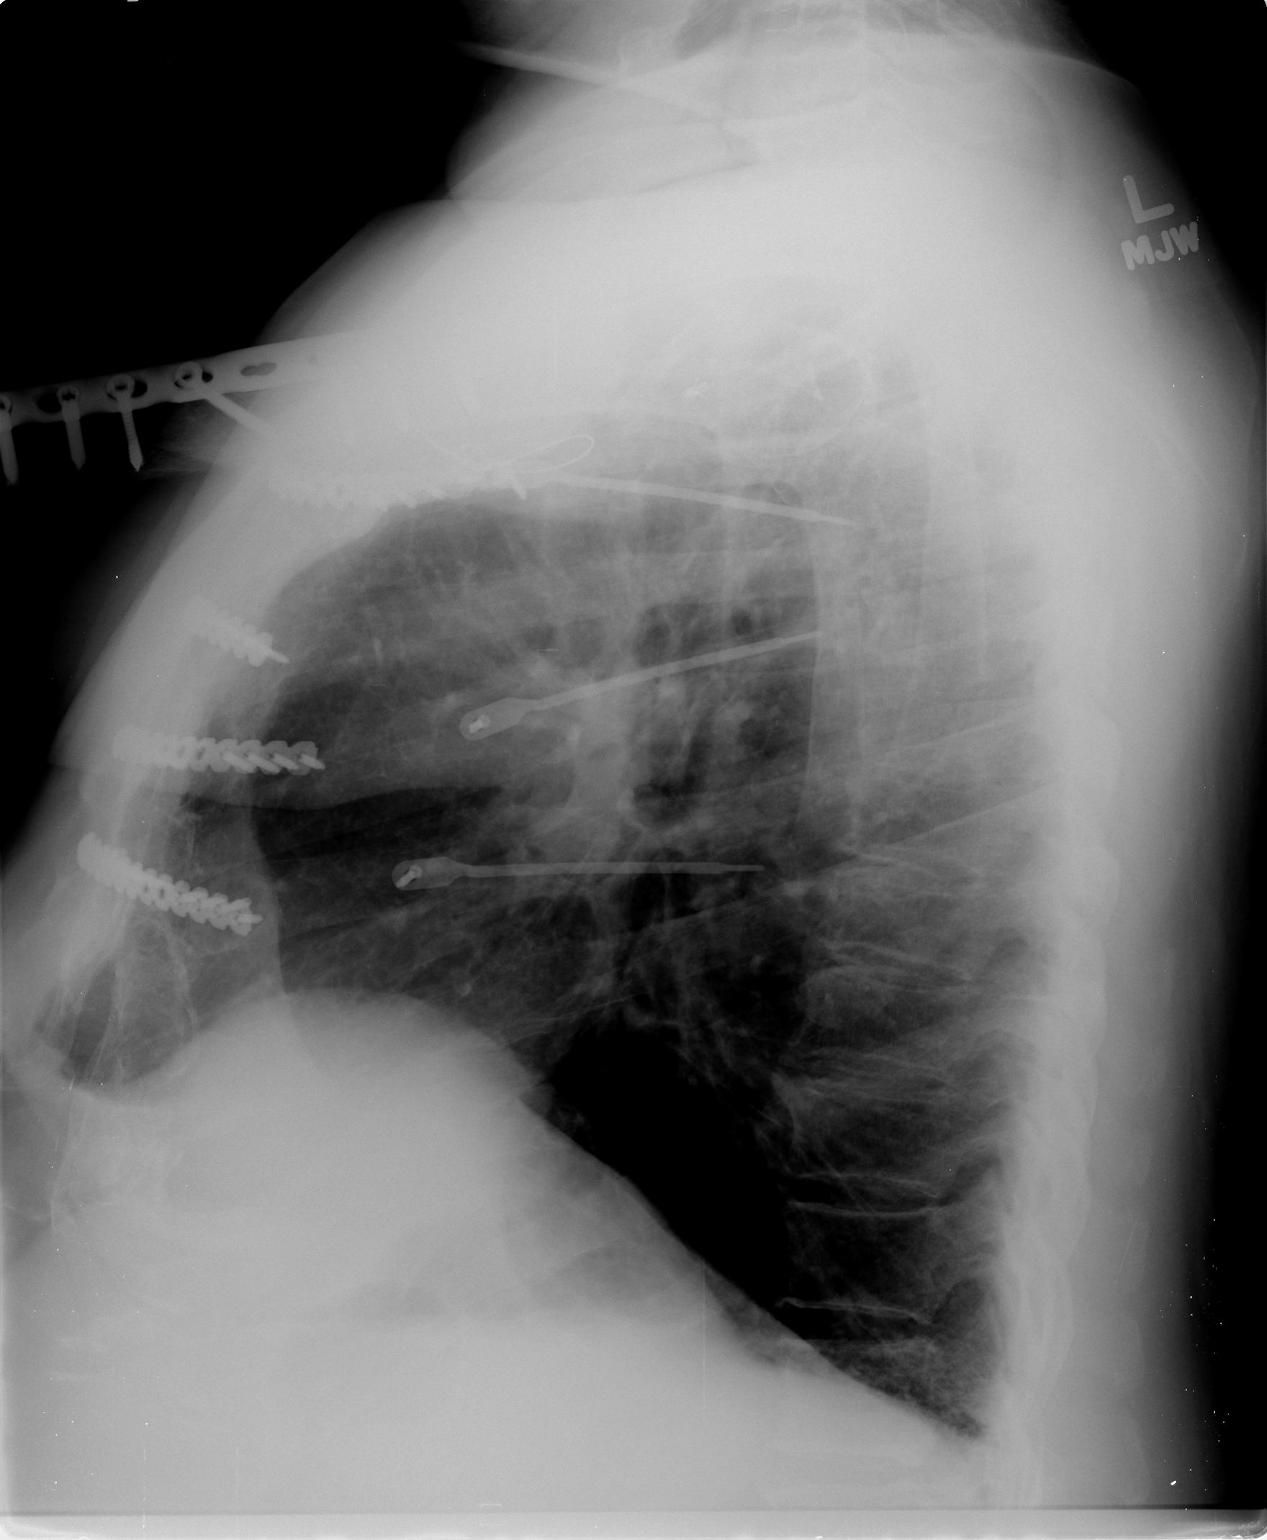

[2 of 2 positions shown; findings below may reference images not displayed]

FINDINGS: Postsurgical changes are again seen in the left rib cage.
The overall appearance is stable.  The lungs are clear bilaterally.
Some chronic scarring is noted along the lateral aspect of the left
lung.  The cardiac shadow is stable.  No new focal abnormality is
noted.
IMPRESSION: No acute abnormality is seen.  Chronic changes involving the left
chest are seen.

## 2013-10-27 IMAGING — RF DG HUMERUS 2V *L*
1 series · 1 of 1 positions shown · non-contrast
Comparison: March 19, 2013

CLINICAL DATA: Intraoperative imaging for hardware removal.

LEFT HUMERUS - 2+ VIEW,DG C-ARM 1-60 MIN

[Series 1: run · 1 of 1 slices shown]
[im 1/1]
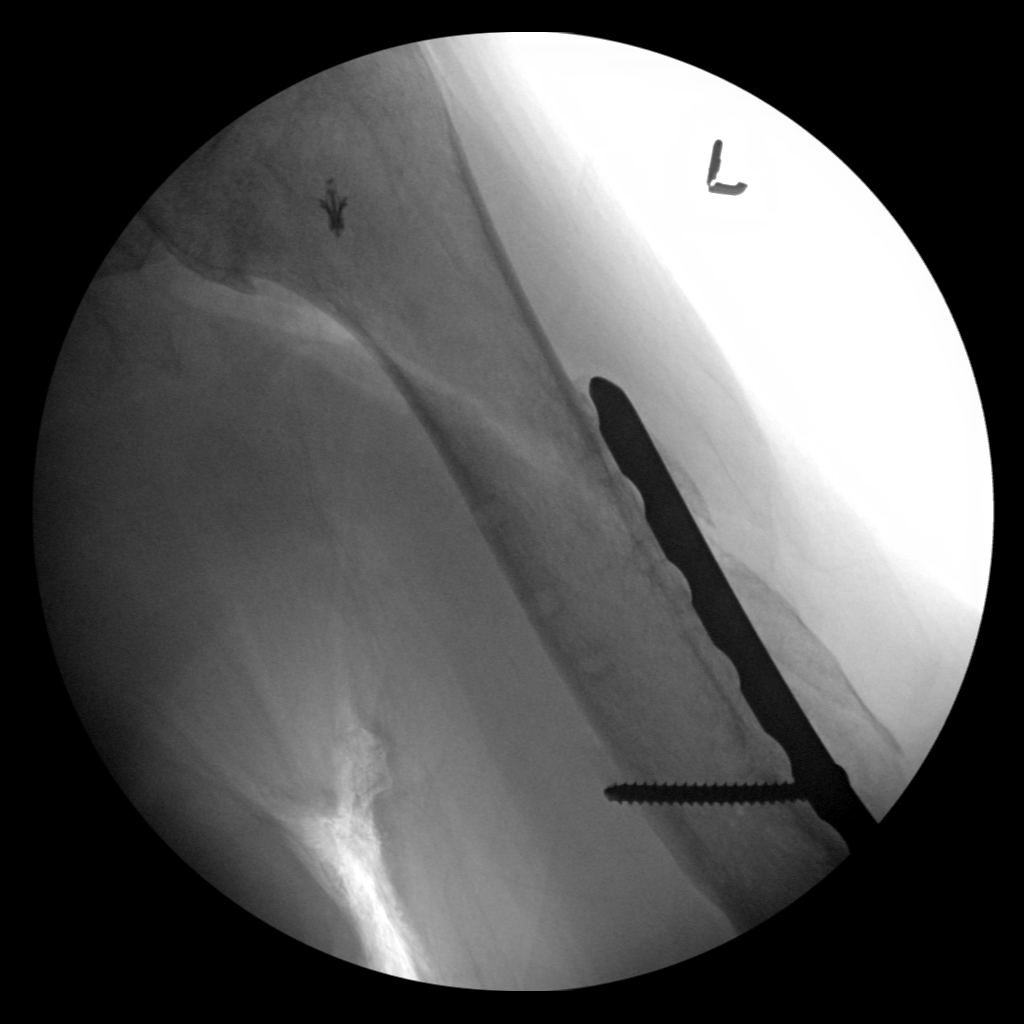

[1 of 1 positions shown; findings below may reference images not displayed]

FINDINGS: The single intraoperative spot image demonstrates that
multiple screws have been removed with one remaining in place. The
inferior extent of the plate and screws were not fully imaged.  The
plate remains intact.  Callus formation about the humerus fracture
is noted.
IMPRESSION: Intraoperative imaging for hardware removal.

## 2013-10-27 IMAGING — CR DG SHOULDER 1V*L*
1 series · 1 of 1 positions shown · non-contrast
Comparison: 03/19/2013 CT

CLINICAL DATA: Left shoulder replacement.

PORTABLE LEFT SHOULDER - 2+ VIEW

[AP]
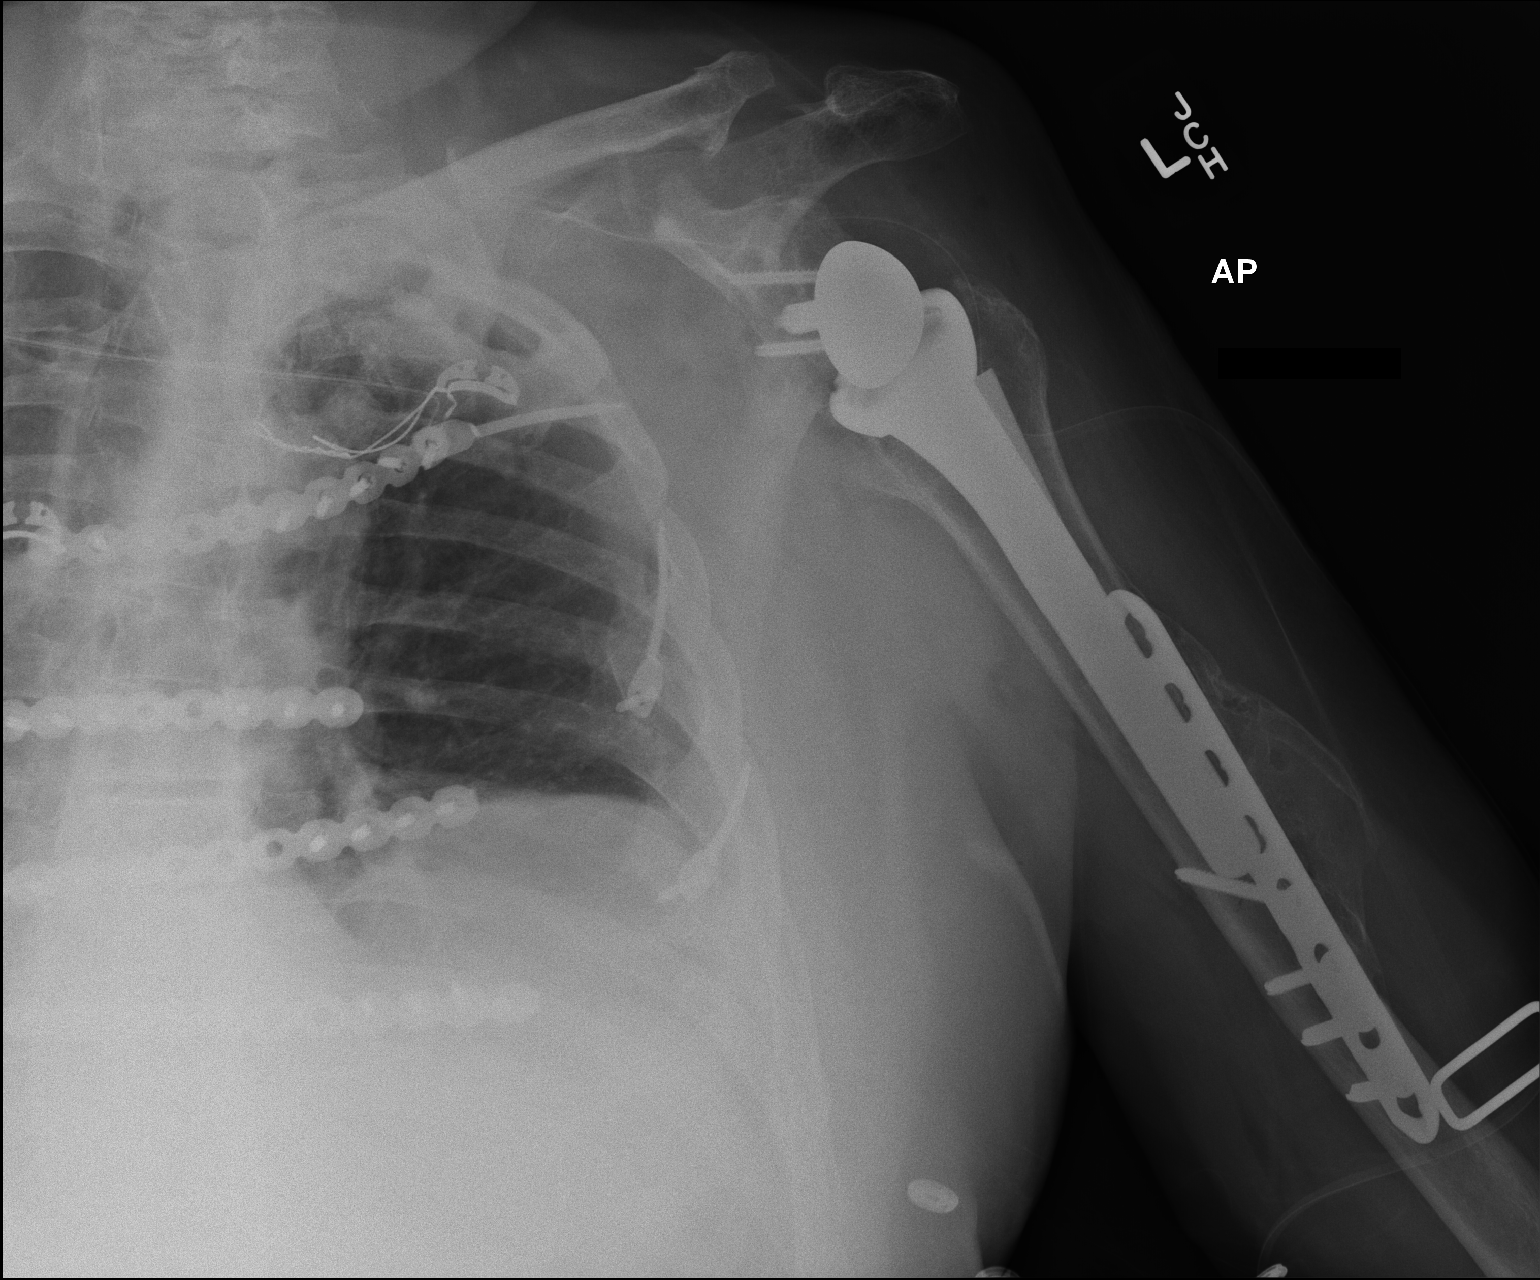

[1 of 1 positions shown; findings below may reference images not displayed]

FINDINGS: Left shoulder arthroplasty identified without definite
complicating features.
Post-traumatic changes within the mid left humerus and left bony
thorax are noted.
Widening of the AC joint is again identified.
IMPRESSION: Left shoulder arthroplasty without definite complicating features.

## 2014-08-12 ENCOUNTER — Ambulatory Visit: Payer: Medicare Other | Attending: Internal Medicine | Admitting: Rehabilitative and Restorative Service Providers"

## 2014-08-12 DIAGNOSIS — IMO0001 Reserved for inherently not codable concepts without codable children: Secondary | ICD-10-CM | POA: Diagnosis not present

## 2014-08-12 DIAGNOSIS — R269 Unspecified abnormalities of gait and mobility: Secondary | ICD-10-CM | POA: Diagnosis not present

## 2014-08-13 ENCOUNTER — Ambulatory Visit: Payer: Medicare Other | Admitting: Rehabilitative and Restorative Service Providers"

## 2014-08-13 DIAGNOSIS — IMO0001 Reserved for inherently not codable concepts without codable children: Secondary | ICD-10-CM | POA: Diagnosis not present

## 2014-08-21 ENCOUNTER — Ambulatory Visit: Payer: Medicare Other | Attending: Internal Medicine | Admitting: Rehabilitative and Restorative Service Providers"

## 2014-08-21 DIAGNOSIS — IMO0001 Reserved for inherently not codable concepts without codable children: Secondary | ICD-10-CM | POA: Insufficient documentation

## 2014-08-21 DIAGNOSIS — R269 Unspecified abnormalities of gait and mobility: Secondary | ICD-10-CM | POA: Diagnosis not present

## 2014-08-26 ENCOUNTER — Ambulatory Visit: Payer: Medicare Other | Admitting: Rehabilitative and Restorative Service Providers"

## 2014-08-26 DIAGNOSIS — IMO0001 Reserved for inherently not codable concepts without codable children: Secondary | ICD-10-CM | POA: Diagnosis not present

## 2014-08-28 ENCOUNTER — Encounter: Payer: Medicare Other | Admitting: Rehabilitative and Restorative Service Providers"

## 2014-09-01 ENCOUNTER — Encounter: Payer: Medicare Other | Admitting: Rehabilitative and Restorative Service Providers"

## 2014-09-04 ENCOUNTER — Ambulatory Visit: Payer: Medicare Other | Admitting: Rehabilitative and Restorative Service Providers"

## 2014-09-04 DIAGNOSIS — IMO0001 Reserved for inherently not codable concepts without codable children: Secondary | ICD-10-CM | POA: Diagnosis not present

## 2014-09-08 ENCOUNTER — Encounter: Payer: Medicare Other | Admitting: Rehabilitative and Restorative Service Providers"

## 2014-09-11 ENCOUNTER — Encounter: Payer: Medicare Other | Admitting: Rehabilitative and Restorative Service Providers"

## 2015-07-05 ENCOUNTER — Ambulatory Visit
Admission: RE | Admit: 2015-07-05 | Discharge: 2015-07-05 | Disposition: A | Discharge status: Home / Self Care | Payer: Medicare Other | Source: Ambulatory Visit | Attending: Internal Medicine | Admitting: Internal Medicine

## 2015-07-05 ENCOUNTER — Other Ambulatory Visit: Payer: Self-pay | Admitting: Internal Medicine

## 2015-07-05 DIAGNOSIS — R609 Edema, unspecified: Secondary | ICD-10-CM

## 2015-07-26 ENCOUNTER — Encounter: Payer: Self-pay | Admitting: Internal Medicine

## 2015-08-26 ENCOUNTER — Encounter: Payer: Self-pay | Admitting: Physical Therapy

## 2015-08-26 ENCOUNTER — Ambulatory Visit: Payer: Medicare Other | Attending: Rheumatology | Admitting: Physical Therapy

## 2015-08-26 DIAGNOSIS — M6281 Muscle weakness (generalized): Secondary | ICD-10-CM | POA: Insufficient documentation

## 2015-08-26 DIAGNOSIS — M545 Low back pain, unspecified: Secondary | ICD-10-CM

## 2015-08-26 DIAGNOSIS — R29898 Other symptoms and signs involving the musculoskeletal system: Secondary | ICD-10-CM | POA: Diagnosis present

## 2015-08-26 NOTE — Patient Instructions (Signed)
Knee-to-Chest Stretch: Unilateral   With hand behind right knee, pull knee in to chest until a comfortable stretch is felt in lower back and buttocks. Keep back relaxed. Hold __15__ seconds. Repeat __2__ times per set. Do _1___ sets per session. Do __1__ sessions per day.  http://orth.exer.us/126   Copyright  VHI. All rights reserved.  Lumbar Rotation (Non-Weight Bearing)   Feet on floor, slowly rock knees from side to side in small, pain-free range of motion. Allow lower back to rotate slightly. Repeat __10__ times per set. Do __1__ sets per session. Do ____ sessions per day.  http://orth.exer.us/160   Copyright  VHI. All rights reserved.  Hamstring Step 3   Left leg in maximal straight leg raise, heel at maximal stretch, straighten knee further by tightening knee cap. Warning: Intense stretch. Stay within tolerance. Hold _30__ seconds. Relax knee cap only. Repeat _2__ times. 1 time per day.  Copyright  VHI. All rights reserved.  [facility] [address], [place], [place] [address] Phone # [phone] Fax [phone]
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
## 2015-08-27 ENCOUNTER — Ambulatory Visit: Payer: Medicare Other

## 2015-08-27 ENCOUNTER — Encounter: Payer: Self-pay | Admitting: Rehabilitation

## 2015-08-27 ENCOUNTER — Ambulatory Visit: Payer: Medicare Other | Admitting: Rehabilitation

## 2015-08-27 DIAGNOSIS — R29898 Other symptoms and signs involving the musculoskeletal system: Secondary | ICD-10-CM

## 2015-08-27 DIAGNOSIS — M545 Low back pain, unspecified: Secondary | ICD-10-CM
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
## 2015-08-30 ENCOUNTER — Ambulatory Visit: Payer: Medicare Other | Admitting: Physical Therapy

## 2015-08-30 DIAGNOSIS — R29898 Other symptoms and signs involving the musculoskeletal system: Secondary | ICD-10-CM

## 2015-08-30 DIAGNOSIS — M545 Low back pain, unspecified: Secondary | ICD-10-CM
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
## 2015-09-01 ENCOUNTER — Ambulatory Visit: Payer: Medicare Other

## 2015-09-01 DIAGNOSIS — M545 Low back pain, unspecified: Secondary | ICD-10-CM

## 2015-09-01 DIAGNOSIS — R29898 Other symptoms and signs involving the musculoskeletal system: Secondary | ICD-10-CM

## 2015-09-01 NOTE — Patient Instructions (Signed)
Bridging   Slowly raise buttocks from floor, keeping stomach tight.  Hold 5 seconds Repeat _10___ times per set. Do __1-2__ sets per session. Do _1___ sessions per day.  http://orth.exer.us/1096   Straight Leg Raise   Tighten stomach and slowly raise locked right leg.   Repeat _10___ times per set. Do _1-2___ sets per session. Do _1___ sessions per day.  http://orth.exer.us/1102   Copyright  VHI. All rights reserved.  [facility] [address] [place], [place] [address] Phone # [phone] Fax [phone]
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
## 2015-09-07 ENCOUNTER — Ambulatory Visit: Payer: Medicare Other | Admitting: Physical Therapy

## 2015-09-07 ENCOUNTER — Encounter: Payer: Self-pay | Admitting: Physical Therapy

## 2015-09-07 DIAGNOSIS — M545 Low back pain, unspecified: Secondary | ICD-10-CM

## 2015-09-07 DIAGNOSIS — R29898 Other symptoms and signs involving the musculoskeletal system: Secondary | ICD-10-CM
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
## 2015-09-09 ENCOUNTER — Encounter: Payer: Self-pay | Admitting: Physical Therapy

## 2015-09-09 ENCOUNTER — Ambulatory Visit: Payer: Medicare Other | Admitting: Physical Therapy

## 2015-09-09 DIAGNOSIS — R29898 Other symptoms and signs involving the musculoskeletal system: Secondary | ICD-10-CM

## 2015-09-09 DIAGNOSIS — M545 Low back pain, unspecified: Secondary | ICD-10-CM
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
## 2015-09-13 ENCOUNTER — Encounter: Payer: Self-pay | Admitting: Physical Therapy

## 2015-09-13 ENCOUNTER — Ambulatory Visit: Payer: Medicare Other | Admitting: Physical Therapy

## 2015-09-13 DIAGNOSIS — M545 Low back pain, unspecified: Secondary | ICD-10-CM

## 2015-09-13 DIAGNOSIS — R29898 Other symptoms and signs involving the musculoskeletal system: Secondary | ICD-10-CM
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
## 2015-09-15 ENCOUNTER — Encounter: Payer: Self-pay | Admitting: Physical Therapy

## 2015-09-15 ENCOUNTER — Ambulatory Visit: Payer: Medicare Other | Admitting: Physical Therapy

## 2015-09-15 DIAGNOSIS — M545 Low back pain, unspecified: Secondary | ICD-10-CM

## 2015-09-15 DIAGNOSIS — R29898 Other symptoms and signs involving the musculoskeletal system: Secondary | ICD-10-CM
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
## 2015-09-20 ENCOUNTER — Ambulatory Visit: Payer: Medicare Other | Attending: Rheumatology | Admitting: Physical Therapy

## 2015-09-20 ENCOUNTER — Encounter: Payer: Self-pay | Admitting: Physical Therapy

## 2015-09-20 DIAGNOSIS — M545 Low back pain, unspecified: Secondary | ICD-10-CM

## 2015-09-20 DIAGNOSIS — M6281 Muscle weakness (generalized): Secondary | ICD-10-CM | POA: Insufficient documentation

## 2015-09-20 DIAGNOSIS — R29898 Other symptoms and signs involving the musculoskeletal system: Secondary | ICD-10-CM
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
## 2015-09-22 ENCOUNTER — Ambulatory Visit: Payer: Medicare Other

## 2015-09-22 DIAGNOSIS — M545 Low back pain, unspecified: Secondary | ICD-10-CM

## 2015-09-22 DIAGNOSIS — R29898 Other symptoms and signs involving the musculoskeletal system: Secondary | ICD-10-CM
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
## 2015-09-27 ENCOUNTER — Ambulatory Visit: Payer: Medicare Other

## 2015-09-27 DIAGNOSIS — R29898 Other symptoms and signs involving the musculoskeletal system: Secondary | ICD-10-CM

## 2015-09-27 DIAGNOSIS — M545 Low back pain, unspecified: Secondary | ICD-10-CM
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
## 2015-09-29 ENCOUNTER — Encounter: Payer: Self-pay | Admitting: Physical Therapy

## 2015-09-29 ENCOUNTER — Ambulatory Visit: Payer: Medicare Other | Admitting: Physical Therapy

## 2015-09-29 DIAGNOSIS — R29898 Other symptoms and signs involving the musculoskeletal system: Secondary | ICD-10-CM

## 2015-09-29 DIAGNOSIS — M545 Low back pain, unspecified: Secondary | ICD-10-CM
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
## 2015-10-04 ENCOUNTER — Ambulatory Visit: Payer: Medicare Other

## 2015-10-04 DIAGNOSIS — M545 Low back pain, unspecified: Secondary | ICD-10-CM

## 2015-10-04 DIAGNOSIS — R29898 Other symptoms and signs involving the musculoskeletal system: Secondary | ICD-10-CM
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
## 2015-10-06 ENCOUNTER — Ambulatory Visit: Payer: Medicare Other | Admitting: Physical Therapy

## 2015-10-06 ENCOUNTER — Encounter: Payer: Self-pay | Admitting: Physical Therapy

## 2015-10-06 DIAGNOSIS — R29898 Other symptoms and signs involving the musculoskeletal system: Secondary | ICD-10-CM

## 2015-10-06 DIAGNOSIS — M545 Low back pain, unspecified: Secondary | ICD-10-CM
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
## 2015-10-11 ENCOUNTER — Ambulatory Visit: Payer: Medicare Other

## 2015-10-11 DIAGNOSIS — M545 Low back pain, unspecified: Secondary | ICD-10-CM

## 2015-10-11 DIAGNOSIS — R29898 Other symptoms and signs involving the musculoskeletal system: Secondary | ICD-10-CM
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
## 2015-10-13 ENCOUNTER — Encounter: Payer: Self-pay | Admitting: Physical Therapy

## 2015-10-13 ENCOUNTER — Ambulatory Visit: Payer: Medicare Other | Admitting: Physical Therapy

## 2015-10-13 DIAGNOSIS — M545 Low back pain, unspecified: Secondary | ICD-10-CM

## 2015-10-13 DIAGNOSIS — R29898 Other symptoms and signs involving the musculoskeletal system: Secondary | ICD-10-CM
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
## 2015-10-18 ENCOUNTER — Encounter: Payer: Medicare Other | Admitting: Physical Therapy

## 2015-10-20 ENCOUNTER — Ambulatory Visit: Payer: Self-pay | Admitting: Physical Therapy

## 2015-11-03 ENCOUNTER — Other Ambulatory Visit: Payer: Self-pay | Admitting: Internal Medicine

## 2015-11-03 DIAGNOSIS — M47816 Spondylosis without myelopathy or radiculopathy, lumbar region: Secondary | ICD-10-CM

## 2015-11-03 DIAGNOSIS — M545 Low back pain: Secondary | ICD-10-CM

## 2015-11-04 ENCOUNTER — Ambulatory Visit
Admission: RE | Admit: 2015-11-04 | Discharge: 2015-11-04 | Disposition: A | Discharge status: Home / Self Care | Payer: Medicare Other | Source: Ambulatory Visit | Attending: Internal Medicine | Admitting: Internal Medicine

## 2015-11-04 DIAGNOSIS — M47816 Spondylosis without myelopathy or radiculopathy, lumbar region: Secondary | ICD-10-CM

## 2015-11-04 DIAGNOSIS — M545 Low back pain: Secondary | ICD-10-CM

## 2015-12-03 ENCOUNTER — Ambulatory Visit (HOSPITAL_COMMUNITY)
Admission: RE | Admit: 2015-12-03 | Discharge: 2015-12-03 | Disposition: A | Discharge status: Home / Self Care | Payer: Medicare Other | Source: Ambulatory Visit | Attending: Vascular Surgery | Admitting: Vascular Surgery

## 2015-12-03 ENCOUNTER — Other Ambulatory Visit (HOSPITAL_COMMUNITY): Payer: Self-pay | Admitting: Internal Medicine

## 2015-12-03 DIAGNOSIS — E7800 Pure hypercholesterolemia, unspecified: Secondary | ICD-10-CM | POA: Diagnosis not present

## 2015-12-03 DIAGNOSIS — M79672 Pain in left foot: Secondary | ICD-10-CM

## 2015-12-03 DIAGNOSIS — M7989 Other specified soft tissue disorders: Secondary | ICD-10-CM | POA: Diagnosis not present

## 2015-12-19 HISTORY — PX: BACK SURGERY: SHX140

## 2015-12-24 ENCOUNTER — Emergency Department (HOSPITAL_COMMUNITY): Payer: Medicare Other

## 2015-12-24 ENCOUNTER — Inpatient Hospital Stay (HOSPITAL_COMMUNITY): Payer: Medicare Other

## 2015-12-24 ENCOUNTER — Encounter (HOSPITAL_COMMUNITY): Payer: Self-pay | Admitting: *Deleted

## 2015-12-24 ENCOUNTER — Inpatient Hospital Stay (HOSPITAL_COMMUNITY)
Admission: EM | Admit: 2015-12-24 | Discharge: 2015-12-27 | DRG: 872 | Disposition: A | Discharge status: Home / Self Care | Payer: Medicare Other | Attending: Internal Medicine | Admitting: Internal Medicine

## 2015-12-24 ENCOUNTER — Other Ambulatory Visit (HOSPITAL_COMMUNITY): Payer: Medicare Other

## 2015-12-24 DIAGNOSIS — A419 Sepsis, unspecified organism: Principal | ICD-10-CM | POA: Diagnosis present

## 2015-12-24 DIAGNOSIS — N178 Other acute kidney failure: Secondary | ICD-10-CM | POA: Diagnosis present

## 2015-12-24 DIAGNOSIS — Z87891 Personal history of nicotine dependence: Secondary | ICD-10-CM

## 2015-12-24 DIAGNOSIS — I517 Cardiomegaly: Secondary | ICD-10-CM | POA: Diagnosis present

## 2015-12-24 DIAGNOSIS — R0600 Dyspnea, unspecified: Secondary | ICD-10-CM | POA: Diagnosis present

## 2015-12-24 DIAGNOSIS — Z8551 Personal history of malignant neoplasm of bladder: Secondary | ICD-10-CM

## 2015-12-24 DIAGNOSIS — E785 Hyperlipidemia, unspecified: Secondary | ICD-10-CM | POA: Diagnosis present

## 2015-12-24 DIAGNOSIS — N179 Acute kidney failure, unspecified: Secondary | ICD-10-CM

## 2015-12-24 DIAGNOSIS — Z8249 Family history of ischemic heart disease and other diseases of the circulatory system: Secondary | ICD-10-CM | POA: Diagnosis not present

## 2015-12-24 DIAGNOSIS — I471 Supraventricular tachycardia: Secondary | ICD-10-CM | POA: Diagnosis present

## 2015-12-24 DIAGNOSIS — I4581 Long QT syndrome: Secondary | ICD-10-CM | POA: Diagnosis present

## 2015-12-24 DIAGNOSIS — Z9181 History of falling: Secondary | ICD-10-CM | POA: Diagnosis not present

## 2015-12-24 DIAGNOSIS — L0390 Cellulitis, unspecified: Secondary | ICD-10-CM

## 2015-12-24 DIAGNOSIS — I440 Atrioventricular block, first degree: Secondary | ICD-10-CM | POA: Diagnosis present

## 2015-12-24 DIAGNOSIS — N183 Chronic kidney disease, stage 3 (moderate): Secondary | ICD-10-CM | POA: Diagnosis present

## 2015-12-24 DIAGNOSIS — R9431 Abnormal electrocardiogram [ECG] [EKG]: Secondary | ICD-10-CM | POA: Diagnosis present

## 2015-12-24 DIAGNOSIS — L03116 Cellulitis of left lower limb: Secondary | ICD-10-CM | POA: Diagnosis present

## 2015-12-24 DIAGNOSIS — E7800 Pure hypercholesterolemia, unspecified: Secondary | ICD-10-CM | POA: Diagnosis present

## 2015-12-24 DIAGNOSIS — R6520 Severe sepsis without septic shock: Secondary | ICD-10-CM | POA: Diagnosis present

## 2015-12-24 LAB — CBC WITH DIFFERENTIAL/PLATELET
BASOS ABS: 0 10*3/uL (ref 0.0–0.1)
BASOS ABS: 0 10*3/uL (ref 0.0–0.1)
BASOS PCT: 0 %
BASOS PCT: 0 %
EOS ABS: 0 10*3/uL (ref 0.0–0.7)
EOS ABS: 0 10*3/uL (ref 0.0–0.7)
EOS PCT: 0 %
Eosinophils Relative: 0 %
HCT: 46.5 % (ref 39.0–52.0)
HEMATOCRIT: 48.8 % (ref 39.0–52.0)
HEMOGLOBIN: 16.3 g/dL (ref 13.0–17.0)
HEMOGLOBIN: 15.4 g/dL (ref 13.0–17.0)
Lymphocytes Relative: 5 %
Lymphocytes Relative: 4 %
Lymphs Abs: 1 10*3/uL (ref 0.7–4.0)
Lymphs Abs: 1.2 10*3/uL (ref 0.7–4.0)
MCH: 30.8 pg (ref 26.0–34.0)
MCH: 31.2 pg (ref 26.0–34.0)
MCHC: 33.4 g/dL (ref 30.0–36.0)
MCHC: 33.1 g/dL (ref 30.0–36.0)
MCV: 92.2 fL (ref 78.0–100.0)
MCV: 94.1 fL (ref 78.0–100.0)
MONOS PCT: 2 %
Monocytes Absolute: 0.4 10*3/uL (ref 0.1–1.0)
Monocytes Absolute: 1.5 10*3/uL — ABNORMAL HIGH (ref 0.1–1.0)
Monocytes Relative: 5 %
NEUTROS PCT: 93 %
NEUTROS PCT: 91 %
Neutro Abs: 18.6 10*3/uL — ABNORMAL HIGH (ref 1.7–7.7)
Neutro Abs: 28 10*3/uL — ABNORMAL HIGH (ref 1.7–7.7)
PLATELETS: 161 10*3/uL (ref 150–400)
PLATELETS: 160 10*3/uL (ref 150–400)
RBC: 5.29 MIL/uL (ref 4.22–5.81)
RBC: 4.94 MIL/uL (ref 4.22–5.81)
RDW: 14.2 % (ref 11.5–15.5)
RDW: 14.6 % (ref 11.5–15.5)
WBC: 20 10*3/uL — ABNORMAL HIGH (ref 4.0–10.5)
WBC: 30.7 10*3/uL — ABNORMAL HIGH (ref 4.0–10.5)

## 2015-12-24 LAB — COMPREHENSIVE METABOLIC PANEL
ALK PHOS: 67 U/L (ref 38–126)
ALT: 22 U/L (ref 17–63)
ANION GAP: 12 (ref 5–15)
AST: 30 U/L (ref 15–41)
Albumin: 3.6 g/dL (ref 3.5–5.0)
BILIRUBIN TOTAL: 1 mg/dL (ref 0.3–1.2)
BUN: 30 mg/dL — ABNORMAL HIGH (ref 6–20)
CALCIUM: 9.3 mg/dL (ref 8.9–10.3)
CO2: 21 mmol/L — AB (ref 22–32)
CREATININE: 1.58 mg/dL — AB (ref 0.61–1.24)
Chloride: 105 mmol/L (ref 101–111)
GFR, EST AFRICAN AMERICAN: 47 mL/min — AB (ref >60)
GFR, EST NON AFRICAN AMERICAN: 41 mL/min — AB (ref >60)
Glucose, Bld: 101 mg/dL — ABNORMAL HIGH (ref 65–99)
Potassium: 4.5 mmol/L (ref 3.5–5.1)
SODIUM: 138 mmol/L (ref 135–145)
TOTAL PROTEIN: 7 g/dL (ref 6.5–8.1)

## 2015-12-24 LAB — BASIC METABOLIC PANEL
ANION GAP: 8 (ref 5–15)
BUN: 29 mg/dL — ABNORMAL HIGH (ref 6–20)
CO2: 24 mmol/L (ref 22–32)
Calcium: 8.3 mg/dL — ABNORMAL LOW (ref 8.9–10.3)
Chloride: 109 mmol/L (ref 101–111)
Creatinine, Ser: 1.7 mg/dL — ABNORMAL HIGH (ref 0.61–1.24)
GFR, EST AFRICAN AMERICAN: 43 mL/min — AB (ref >60)
GFR, EST NON AFRICAN AMERICAN: 37 mL/min — AB (ref >60)
Glucose, Bld: 95 mg/dL (ref 65–99)
POTASSIUM: 4.1 mmol/L (ref 3.5–5.1)
SODIUM: 141 mmol/L (ref 135–145)

## 2015-12-24 LAB — URINALYSIS, ROUTINE W REFLEX MICROSCOPIC
Bilirubin Urine: NEGATIVE
GLUCOSE, UA: NEGATIVE mg/dL
HGB URINE DIPSTICK: NEGATIVE
Ketones, ur: NEGATIVE mg/dL
LEUKOCYTES UA: NEGATIVE
Nitrite: NEGATIVE
PROTEIN: 30 mg/dL — AB
SPECIFIC GRAVITY, URINE: 1.017 (ref 1.005–1.030)
pH: 5 (ref 5.0–8.0)

## 2015-12-24 LAB — PROTIME-INR
INR: 1.04 (ref 0.00–1.49)
PROTHROMBIN TIME: 13.8 s (ref 11.6–15.2)

## 2015-12-24 LAB — MRSA PCR SCREENING: MRSA by PCR: NEGATIVE

## 2015-12-24 LAB — I-STAT CG4 LACTIC ACID, ED
LACTIC ACID, VENOUS: 2.46 mmol/L — AB (ref 0.5–2.0)
LACTIC ACID, VENOUS: 1.94 mmol/L (ref 0.5–2.0)

## 2015-12-24 LAB — URINE MICROSCOPIC-ADD ON
RBC / HPF: NONE SEEN RBC/hpf (ref 0–5)
WBC, UA: NONE SEEN WBC/hpf (ref 0–5)

## 2015-12-24 MED ORDER — ACETAMINOPHEN 325 MG PO TABS
650.0000 mg | ORAL_TABLET | Freq: Four times a day (QID) | ORAL | Status: DC | PRN
  Administered 2015-12-24 – 2015-12-25 (×2): 650 mg via ORAL
  Filled 2015-12-24 (×2): qty 2

## 2015-12-24 MED ORDER — SODIUM CHLORIDE 0.9 % IV SOLN
INTRAVENOUS | Status: DC
  Administered 2015-12-24: 20 mL/h via INTRAVENOUS
  Administered 2015-12-24 – 2015-12-25 (×3): via INTRAVENOUS

## 2015-12-24 MED ORDER — ACETAMINOPHEN 650 MG RE SUPP: 650.0000 mg | Freq: Four times a day (QID) | RECTAL | Status: DC | PRN

## 2015-12-24 MED ORDER — CEFEPIME HCL 2 G IJ SOLR
2.0000 g | Freq: Two times a day (BID) | INTRAMUSCULAR | Status: DC
  Administered 2015-12-24 – 2015-12-27 (×6): 2 g via INTRAVENOUS
  Filled 2015-12-24 (×8): qty 2

## 2015-12-24 MED ORDER — VANCOMYCIN HCL 10 G IV SOLR
1500.0000 mg | INTRAVENOUS | Status: DC
  Administered 2015-12-25 – 2015-12-27 (×3): 1500 mg via INTRAVENOUS
  Filled 2015-12-24 (×3): qty 1500

## 2015-12-24 MED ORDER — VANCOMYCIN HCL 10 G IV SOLR
1250.0000 mg | Freq: Once | INTRAVENOUS | Status: AC
  Administered 2015-12-24: 1250 mg via INTRAVENOUS
  Filled 2015-12-24: qty 1250

## 2015-12-24 MED ORDER — MORPHINE SULFATE (PF) 2 MG/ML IV SOLN
1.0000 mg | INTRAVENOUS | Status: DC | PRN
Start: 2015-12-24 — End: 2015-12-27

## 2015-12-24 MED ORDER — SIMVASTATIN 40 MG PO TABS
40.0000 mg | ORAL_TABLET | Freq: Every day | ORAL | Status: DC
  Administered 2015-12-25 – 2015-12-27 (×3): 40 mg via ORAL
  Filled 2015-12-24 (×3): qty 1

## 2015-12-24 MED ORDER — ASPIRIN EC 81 MG PO TBEC
81.0000 mg | DELAYED_RELEASE_TABLET | Freq: Every day | ORAL | Status: DC
  Administered 2015-12-24 – 2015-12-27 (×4): 81 mg via ORAL
  Filled 2015-12-24 (×4): qty 1

## 2015-12-24 MED ORDER — SODIUM CHLORIDE 0.9 % IV BOLUS (SEPSIS)
1000.0000 mL | INTRAVENOUS | Status: DC
  Administered 2015-12-24 (×3): 1000 mL via INTRAVENOUS

## 2015-12-24 MED ORDER — VANCOMYCIN HCL IN DEXTROSE 1-5 GM/200ML-% IV SOLN
1000.0000 mg | Freq: Once | INTRAVENOUS | Status: AC
  Administered 2015-12-24: 1000 mg via INTRAVENOUS
  Filled 2015-12-24: qty 200

## 2015-12-24 MED ORDER — OXYCODONE HCL 5 MG PO TABS
5.0000 mg | ORAL_TABLET | ORAL | Status: DC | PRN
  Administered 2015-12-24 – 2015-12-26 (×5): 5 mg via ORAL
  Filled 2015-12-24 (×5): qty 1

## 2015-12-24 MED ORDER — CEFEPIME HCL 2 G IJ SOLR
2.0000 g | Freq: Once | INTRAMUSCULAR | Status: AC
  Administered 2015-12-24: 2 g via INTRAVENOUS
  Filled 2015-12-24: qty 2

## 2015-12-24 MED ORDER — SODIUM CHLORIDE 0.9 % IJ SOLN
3.0000 mL | Freq: Two times a day (BID) | INTRAMUSCULAR | Status: DC
  Administered 2015-12-24 – 2015-12-26 (×3): 3 mL via INTRAVENOUS

## 2015-12-24 MED ORDER — ENOXAPARIN SODIUM 40 MG/0.4ML ~~LOC~~ SOLN
40.0000 mg | SUBCUTANEOUS | Status: DC
  Administered 2015-12-24 – 2015-12-27 (×4): 40 mg via SUBCUTANEOUS
  Filled 2015-12-24 (×4): qty 0.4

## 2015-12-24 MED ORDER — ACETAMINOPHEN 325 MG PO TABS
650.0000 mg | ORAL_TABLET | Freq: Once | ORAL | Status: AC
  Administered 2015-12-24: 650 mg via ORAL
  Filled 2015-12-24: qty 2
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
## 2015-12-24 NOTE — ED Notes (Signed)
Myself and [place], NT assisted Hassan Rowan, RN with moving patient over to hospital bed; readjusted patient and now patient is resting and awaiting a SDU bed; visitor at bedside

## 2015-12-24 NOTE — ED Notes (Signed)
Pt taken to xray 
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
## 2015-12-24 NOTE — ED Notes (Signed)
Patient given a towel to place across him; wife at bedside talking on her cell phone

## 2015-12-24 NOTE — Progress Notes (Signed)
Utilization Review Completed.[name] T1/05/2016  

## 2015-12-24 NOTE — ED Notes (Signed)
EMS called out for a fall at home, in which pt slipped off of couch. EMS noted pt to be SOB and tachycardic at 150. 10mg  Cardizem given without improvement. Pt febrile on arrival, redness and warmth noted to L leg. Pt has seen PCP for redness to leg and given cream to apply. Reports improvement to leg

## 2015-12-24 NOTE — ED Notes (Signed)
Dr. Nanavati at bedside 

## 2015-12-24 NOTE — Plan of Care (Signed)
Problem: Respiratory: Goal: Ability to maintain adequate ventilation will improve Outcome: Progressing No s/s of respiratory discomfort, o2 sats 98% on room air, lungs clear but diminished in bases bilaterally.

## 2015-12-24 NOTE — Progress Notes (Signed)
Temp 99.1, tylenol given. No complaints, sitting up in chair.

## 2015-12-24 NOTE — Plan of Care (Signed)
Called by Dr.Nanavati was regarding patient name [name], 80 year old male brought in after patient was having fever and chills at home. Patient was found to be tachypneic and tachycardic in the ER. Lactic acid was elevated and patient was febrile. As per the ER physician source could be lower extremity cellulitis. Patient has been admitted for sepsis from cellulitis.  Gean Birchwood

## 2015-12-24 NOTE — ED Notes (Signed)
Patient has finished eating at this time; visitors at bedside; patient has on clean gown; Hassan Rowan, RN present in room
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
## 2015-12-25 ENCOUNTER — Inpatient Hospital Stay (HOSPITAL_COMMUNITY): Payer: Medicare Other

## 2015-12-25 DIAGNOSIS — A419 Sepsis, unspecified organism: Principal | ICD-10-CM

## 2015-12-25 DIAGNOSIS — L0390 Cellulitis, unspecified: Secondary | ICD-10-CM

## 2015-12-25 DIAGNOSIS — R0600 Dyspnea, unspecified: Secondary | ICD-10-CM

## 2015-12-25 LAB — CBC WITH DIFFERENTIAL/PLATELET
BASOS ABS: 0 10*3/uL (ref 0.0–0.1)
Basophils Relative: 0 %
EOS ABS: 0.2 10*3/uL (ref 0.0–0.7)
EOS PCT: 1 %
HCT: 42 % (ref 39.0–52.0)
Hemoglobin: 13.5 g/dL (ref 13.0–17.0)
Lymphocytes Relative: 6 %
Lymphs Abs: 1.2 10*3/uL (ref 0.7–4.0)
MCH: 30.1 pg (ref 26.0–34.0)
MCHC: 32.1 g/dL (ref 30.0–36.0)
MCV: 93.8 fL (ref 78.0–100.0)
MONO ABS: 0.8 10*3/uL (ref 0.1–1.0)
Monocytes Relative: 4 %
Neutro Abs: 17.6 10*3/uL — ABNORMAL HIGH (ref 1.7–7.7)
Neutrophils Relative %: 89 %
PLATELETS: 127 10*3/uL — AB (ref 150–400)
RBC: 4.48 MIL/uL (ref 4.22–5.81)
RDW: 15.1 % (ref 11.5–15.5)
WBC: 19.8 10*3/uL — AB (ref 4.0–10.5)

## 2015-12-25 LAB — COMPREHENSIVE METABOLIC PANEL
ALT: 18 U/L (ref 17–63)
AST: 21 U/L (ref 15–41)
Albumin: 2.6 g/dL — ABNORMAL LOW (ref 3.5–5.0)
Alkaline Phosphatase: 54 U/L (ref 38–126)
Anion gap: 6 (ref 5–15)
BUN: 21 mg/dL — ABNORMAL HIGH (ref 6–20)
CHLORIDE: 109 mmol/L (ref 101–111)
CO2: 26 mmol/L (ref 22–32)
CREATININE: 1.45 mg/dL — AB (ref 0.61–1.24)
Calcium: 8.5 mg/dL — ABNORMAL LOW (ref 8.9–10.3)
GFR, EST AFRICAN AMERICAN: 52 mL/min — AB (ref >60)
GFR, EST NON AFRICAN AMERICAN: 45 mL/min — AB (ref >60)
Glucose, Bld: 128 mg/dL — ABNORMAL HIGH (ref 65–99)
POTASSIUM: 5.2 mmol/L — AB (ref 3.5–5.1)
SODIUM: 141 mmol/L (ref 135–145)
Total Bilirubin: 0.9 mg/dL (ref 0.3–1.2)
Total Protein: 5.6 g/dL — ABNORMAL LOW (ref 6.5–8.1)

## 2015-12-25 LAB — URINE CULTURE

## 2015-12-25 NOTE — Progress Notes (Signed)
*  PRELIMINARY RESULTS* Echocardiogram 2D Echocardiogram has been performed.  Leavy Cella 12/25/2015, 4:34 PM

## 2015-12-25 NOTE — Progress Notes (Addendum)
Patient Demographics  [name], is a 80 y.o. male, DOB - 08-17-1936, [id number]  Admit date - 12/24/2015   Admitting Physician [name], MD  Outpatient Primary MD for the patient is Carl Reel, MD  LOS - 1   Chief Complaint  Patient presents with  . Tachycardia  . Fever         Subjective:   [name] today has, No headache, No chest pain, No abdominal pain - No Nausea, No Cough - SOB.  Assessment & Plan    Principal Problem:   Sepsis due to cellulitis [facility]) Active Problems:   Acute dyspnea   RVH (right ventricular hypertrophy)   Acute renal failure (HCC)   HLD (hyperlipidemia)   Prolonged Q-T interval on ECG  Sepsis due to cellulitis  - Patient presents with fever, tachypnea, tachycardia, and leukocytosis, patient sepsis secondary to cellulitis - Blood cultures with no growth to date - Continue with IV vancomycin and cefepime for patient cellulitis,(MDRO thigh infection in 2009) - Continue with when necessary pain medication, continue with leg elevation.  Acute on chronic kidney disease stage III - Baseline creatinine 1.2, is 1.7 admission, improving on IV fluid, avoid nephrotoxic medication.  RVH (right ventricular hypertrophy) -Noted on previous echocardiogram in 2009 - No evidence of volume overload  HLD (hyperlipidemia) - Continue Zocor and aspirin  Prolonged Q-T interval on ECG -Also with prolonged PR interval which is chronic   Code Status: Full  Family Communication: None at bedside  Disposition Plan: Ending further workup   Procedures none   Consults   none   Medications  Scheduled Meds: . aspirin EC  81 mg Oral Daily  . ceFEPime (MAXIPIME) IV  2 g Intravenous Q12H  . enoxaparin (LOVENOX) injection  40 mg Subcutaneous Q24H  . simvastatin  40 mg Oral Q breakfast  . sodium chloride  3 mL Intravenous Q12H  . vancomycin  1,500  mg Intravenous Q24H   Continuous Infusions: . sodium chloride 100 mL/hr at 12/25/15 0600   PRN Meds:.acetaminophen **OR** acetaminophen, morphine injection, oxyCODONE  DVT Prophylaxis  Lovenox   Lab Results  Component Value Date   PLT 127* 12/25/2015    Antibiotics   Anti-infectives    Start     Dose/Rate Route Frequency Ordered Stop   12/25/15 0900  vancomycin (VANCOCIN) 1,500 mg in sodium chloride 0.9 % 500 mL IVPB     1,500 mg 250 mL/hr over 120 Minutes Intravenous Every 24 hours 12/24/15 0810     12/24/15 1600  ceFEPIme (MAXIPIME) 2 g in dextrose 5 % 50 mL IVPB     2 g 100 mL/hr over 30 Minutes Intravenous Every 12 hours 12/24/15 0725     12/24/15 0815  vancomycin (VANCOCIN) 1,250 mg in sodium chloride 0.9 % 250 mL IVPB     1,250 mg 166.7 mL/hr over 90 Minutes Intravenous  Once 12/24/15 0810 12/24/15 1034   12/24/15 0515  ceFEPIme (MAXIPIME) 2 g in dextrose 5 % 50 mL IVPB     2 g 100 mL/hr over 30 Minutes Intravenous  Once 12/24/15 0508 12/24/15 0608   12/24/15 0400  vancomycin (VANCOCIN) IVPB 1000 mg/200 mL premix     1,000 mg 200 mL/hr over 60 Minutes Intravenous  Once 12/24/15 0353 12/24/15 0527          Objective:   Filed Vitals:   12/25/15 0855 12/25/15 1000 12/25/15 1055 12/25/15 1200  BP:  123/62  106/72  Pulse:  92  86  Temp: 99.6 F (37.6 C)  98.8 F (37.1 C) 98.1 F (36.7 C)  TempSrc: Oral  Oral Oral  Resp:  23  22  Height:      Weight:      SpO2:  95%  95%    Wt Readings from Last 3 Encounters:  12/25/15 125.465 kg (276 lb 9.6 oz)  11/04/15 124.739 kg (275 lb)  04/25/13 124.512 kg (274 lb 8 oz)     Intake/Output Summary (Last 24 hours) at 12/25/15 1353 Last data filed at 12/25/15 1300  Gross per 24 hour  Intake   3080 ml  Output   2260 ml  Net    820 ml     Physical Exam  Awake Alert, Oriented X 3, No new F.N deficits, Normal affect [place].AT,PERRAL Supple Neck,No JVD, No cervical lymphadenopathy appriciated.  Symmetrical Chest  wall movement, Good air movement bilaterally, CTAB RRR,No Gallops,Rubs or new Murmurs, No Parasternal Heave +ve B.Sounds, Abd Soft, No tenderness, No rebound - guarding or rigidity. No Cyanosis, Clubbing , left lower extremity erythema improving and receding from [place] line, but it is extensive involving entire left lower extremity up to upper thigh area.   Data Review   Micro Results Recent Results (from the past 240 hour(s))  Culture, blood (routine x 2)     Status: None (Preliminary result)   Collection Time: 12/24/15  3:32 AM  Result Value Ref Range Status   Specimen Description BLOOD LEFT ARM  Final   Special Requests BOTTLES DRAWN AEROBIC AND ANAEROBIC 5ML  Final   Culture NO GROWTH 1 DAY  Final   Report Status PENDING  Incomplete  Culture, blood (routine x 2)     Status: None (Preliminary result)   Collection Time: 12/24/15  4:23 AM  Result Value Ref Range Status   Specimen Description BLOOD LEFT ARM  Final   Special Requests AEROBIC BOTTLE ONLY 8ML  Final   Culture NO GROWTH 1 DAY  Final   Report Status PENDING  Incomplete  Urine culture     Status: None   Collection Time: 12/24/15  6:20 AM  Result Value Ref Range Status   Specimen Description URINE, CLEAN CATCH  Final   Special Requests NONE  Final   Culture 7,000 COLONIES/mL INSIGNIFICANT GROWTH  Final   Report Status 12/25/2015 FINAL  Final  MRSA PCR Screening     Status: None   Collection Time: 12/24/15 11:26 AM  Result Value Ref Range Status   MRSA by PCR NEGATIVE NEGATIVE Final    Comment:        The GeneXpert MRSA Assay (FDA approved for NASAL specimens only), is one component of a comprehensive MRSA colonization surveillance program. It is not intended to diagnose MRSA infection nor to guide or monitor treatment for MRSA infections.     Radiology Reports Dg Chest 2 View  12/24/2015  CLINICAL DATA:  Shortness of breath. EXAM: CHEST  2 VIEW COMPARISON:  04/25/2013 FINDINGS: Lung volumes are low.  Cardiomediastinal contours are unchanged. Chronic blunting and scarring of left costophrenic angle. Mild bronchitic change. No consolidation to suggest pneumonia. No pneumothorax. Surgical change throughout the left hemithorax with rib fracture repair. Reverse left shoulder arthroplasty. Advanced right shoulder degenerative change. IMPRESSION: 1. Mild bronchitic  change. 2. Postsurgical change in the left hemithorax. Electronically Signed   By: Jeb Levering M.D.   On: 12/24/2015 04:08   Dg Chest Portable 1 View  12/24/2015  CLINICAL DATA:  Shortness of breath. EXAM: PORTABLE CHEST 1 VIEW COMPARISON:  12/24/2015 and 04/25/2013 FINDINGS: Heart size and pulmonary vascularity are normal. Peribronchial thickening is unchanged. Postsurgical changes in the left hemi thorax, stable. Moderate arthritis of the right glenohumeral joint. Left reverse shoulder prosthesis. IMPRESSION: Stable bronchitic changes. Electronically Signed   By: Lorriane Shire M.D.   On: 12/24/2015 07:49     CBC  Recent Labs Lab 12/24/15 0326 12/24/15 0755 12/25/15 0420  WBC 20.0* 30.7* 19.8*  HGB 16.3 15.4 13.5  HCT 48.8 46.5 42.0  PLT 161 160 127*  MCV 92.2 94.1 93.8  MCH 30.8 31.2 30.1  MCHC 33.4 33.1 32.1  RDW 14.2 14.6 15.1  LYMPHSABS 1.0 1.2 1.2  MONOABS 0.4 1.5* 0.8  EOSABS 0.0 0.0 0.2  BASOSABS 0.0 0.0 0.0    Chemistries   Recent Labs Lab 12/24/15 0326 12/24/15 0755 12/25/15 0420  NA 138 141 141  K 4.5 4.1 5.2*  CL 105 109 109  CO2 21* 24 26  GLUCOSE 101* 95 128*  BUN 30* 29* 21*  CREATININE 1.58* 1.70* 1.45*  CALCIUM 9.3 8.3* 8.5*  AST 30  --  21  ALT 22  --  18  ALKPHOS 67  --  54  BILITOT 1.0  --  0.9   ------------------------------------------------------------------------------------------------------------------ estimated creatinine clearance is 60 mL/min (by C-G formula based on Cr of  1.45). ------------------------------------------------------------------------------------------------------------------ No results for input(s): HGBA1C in the last 72 hours. ------------------------------------------------------------------------------------------------------------------ No results for input(s): CHOL, HDL, LDLCALC, TRIG, CHOLHDL, LDLDIRECT in the last 72 hours. ------------------------------------------------------------------------------------------------------------------ No results for input(s): TSH, T4TOTAL, T3FREE, THYROIDAB in the last 72 hours.  Invalid input(s): FREET3 ------------------------------------------------------------------------------------------------------------------ No results for input(s): VITAMINB12, FOLATE, FERRITIN, TIBC, IRON, RETICCTPCT in the last 72 hours.  Coagulation profile  Recent Labs Lab 12/24/15 0332  INR 1.04    No results for input(s): DDIMER in the last 72 hours.  Cardiac Enzymes No results for input(s): CKMB, TROPONINI, MYOGLOBIN in the last 168 hours.  Invalid input(s): CK ------------------------------------------------------------------------------------------------------------------ Invalid input(s): POCBNP     Time Spent in minutes   30 minutes   ELGERGAWY, DAWOOD M.D on 12/25/2015 at 1:53 PM  Between 7am to 7pm - Pager - [phone]  After 7pm go to [email or web address] - password [email or web address]  [facility]   Office  [phone]

## 2015-12-25 NOTE — Plan of Care (Signed)
Problem: Fluid Volume: Goal: Hemodynamic stability will improve Outcome: Progressing BP 126/66, temp at 8 am 91.5 F, now 98 F, sats 95% room air

## 2015-12-26 LAB — BASIC METABOLIC PANEL
ANION GAP: 7 (ref 5–15)
BUN: 19 mg/dL (ref 6–20)
CALCIUM: 8.4 mg/dL — AB (ref 8.9–10.3)
CHLORIDE: 109 mmol/L (ref 101–111)
CO2: 23 mmol/L (ref 22–32)
CREATININE: 1.09 mg/dL (ref 0.61–1.24)
GFR calc non Af Amer: >60 mL/min (ref >60)
Glucose, Bld: 111 mg/dL — ABNORMAL HIGH (ref 65–99)
Potassium: 4.5 mmol/L (ref 3.5–5.1)
Sodium: 139 mmol/L (ref 135–145)

## 2015-12-26 LAB — CBC
HCT: 43.5 % (ref 39.0–52.0)
Hemoglobin: 13.7 g/dL (ref 13.0–17.0)
MCH: 29.7 pg (ref 26.0–34.0)
MCHC: 31.5 g/dL (ref 30.0–36.0)
MCV: 94.4 fL (ref 78.0–100.0)
PLATELETS: 127 10*3/uL — AB (ref 150–400)
RBC: 4.61 MIL/uL (ref 4.22–5.81)
RDW: 15.2 % (ref 11.5–15.5)
WBC: 12.8 10*3/uL — AB (ref 4.0–10.5)

## 2015-12-26 MED ORDER — ZOLPIDEM TARTRATE 5 MG PO TABS
5.0000 mg | ORAL_TABLET | Freq: Once | ORAL | Status: AC
  Administered 2015-12-26: 5 mg via ORAL
  Filled 2015-12-26: qty 1
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
## 2015-12-27 MED ORDER — SACCHAROMYCES BOULARDII 250 MG PO CAPS: 250.0000 mg | ORAL_CAPSULE | Freq: Two times a day (BID) | ORAL | Status: AC

## 2015-12-27 MED ORDER — AMOXICILLIN-POT CLAVULANATE 875-125 MG PO TABS: 1.0000 | ORAL_TABLET | Freq: Two times a day (BID) | ORAL | Status: AC

## 2015-12-27 MED ORDER — ACETAMINOPHEN 325 MG PO TABS: 650.0000 mg | ORAL_TABLET | Freq: Four times a day (QID) | ORAL | Status: DC | PRN

## 2015-12-27 NOTE — Plan of Care (Signed)
Problem: Physical Regulation: Goal: Signs and symptoms of infection will decrease Outcome: Completed/Met Date Met:  12/27/15 Patient has been afebrile and vital signs have been stable.      

## 2015-12-27 NOTE — Discharge Instructions (Signed)
Follow with Primary MD Carl Reel, MD in 7 days   Get CBC, CMP,checked  by Primary MD next visit.    Activity: As tolerated with Full fall precautions use walker/cane & assistance as needed   Disposition Home    Diet: Heart Healthy  , with feeding assistance and aspiration precautions.  For Heart failure patients - Check your Weight same time everyday, if you gain over 2 pounds, or you develop in leg swelling, experience more shortness of breath or chest pain, call your Primary MD immediately. Follow Cardiac Low Salt Diet and 1.5 lit/day fluid restriction.   On your next visit with your primary care physician please Get Medicines reviewed and adjusted.   Please request your Prim.MD to go over all Hospital Tests and Procedure/Radiological results at the follow up, please get all Hospital records sent to your Prim MD by signing hospital release before you go home.   If you experience worsening of your admission symptoms, develop shortness of breath, life threatening emergency, suicidal or homicidal thoughts you must seek medical attention immediately by calling 911 or calling your MD immediately  if symptoms less severe.  You Must read complete instructions/literature along with all the possible adverse reactions/side effects for all the Medicines you take and that have been prescribed to you. Take any new Medicines after you have completely understood and accpet all the possible adverse reactions/side effects.   Do not drive, operating heavy machinery, perform activities at heights, swimming or participation in water activities or provide baby sitting services if your were admitted for syncope or siezures until you have seen by Primary MD or a Neurologist and advised to do so again.  Do not drive when taking Pain medications.    Do not take more than prescribed Pain, Sleep and Anxiety Medications  Special Instructions: If you have smoked or chewed Tobacco  in the last 2 yrs please  stop smoking, stop any regular Alcohol  and or any Recreational drug use.  Wear Seat belts while driving.   Please note  You were cared for by a hospitalist during your hospital stay. If you have any questions about your discharge medications or the care you received while you were in the hospital after you are discharged, you can call the unit and asked to speak with the hospitalist on call if the hospitalist that took care of you is not available. Once you are discharged, your primary care physician will handle any further medical issues. Please note that NO REFILLS for any discharge medications will be authorized once you are discharged, as it is imperative that you return to your primary care physician (or establish a relationship with a primary care physician if you do not have one) for your aftercare needs so that they can reassess your need for medications and monitor your lab values.    Cellulitis Cellulitis is an infection of the skin and the tissue beneath it. The infected area is usually red and tender. Cellulitis occurs most often in the arms and lower legs.  CAUSES  Cellulitis is caused by bacteria that enter the skin through cracks or cuts in the skin. The most common types of bacteria that cause cellulitis are staphylococci and streptococci. SIGNS AND SYMPTOMS   Redness and warmth.  Swelling.  Tenderness or pain.  Fever. DIAGNOSIS  Your health care provider can usually determine what is wrong based on a physical exam. Blood tests may also be done. TREATMENT  Treatment usually involves taking an antibiotic medicine.  HOME CARE INSTRUCTIONS   Take your antibiotic medicine as directed by your health care provider. Finish the antibiotic even if you start to feel better.  Keep the infected arm or leg elevated to reduce swelling.  Apply a warm cloth to the affected area up to 4 times per day to relieve pain.  Take medicines only as directed by your health care  provider.  Keep all follow-up visits as directed by your health care provider. SEEK MEDICAL CARE IF:   You notice red streaks coming from the infected area.  Your red area gets larger or turns dark in color.  Your bone or joint underneath the infected area becomes painful after the skin has healed.  Your infection returns in the same area or another area.  You notice a swollen bump in the infected area.  You develop new symptoms.  You have a fever. SEEK IMMEDIATE MEDICAL CARE IF:   You feel very sleepy.  You develop vomiting or diarrhea.  You have a general ill feeling (malaise) with muscle aches and pains.   This information is not intended to replace advice given to you by your health care provider. Make sure you discuss any questions you have with your health care provider.   Document Released: 09/13/2005 Document Revised: 08/25/2015 Document Reviewed: 02/19/2012 Elsevier Interactive Patient Education 2016 Elsevier Inc[employer]  Sepsis, Adult Sepsis is a serious infection of your blood or tissues that affects your whole body. The infection that causes sepsis may be bacterial, viral, fungal, or parasitic. Sepsis may be life threatening. Sepsis can cause your blood pressure to drop. This may result in shock. Shock causes your central nervous system and your organs to stop working correctly.  RISK FACTORS Sepsis can happen in anyone, but it is more likely to happen in people who have weakened immune systems. SIGNS AND SYMPTOMS  Symptoms of sepsis can include:  Fever or low body temperature (hypothermia).  Rapid breathing (hyperventilation).  Chills.  Rapid heartbeat (tachycardia).  Confusion or light-headedness.  Trouble breathing.  Urinating much less than usual.  Cool, clammy skin or red, flushed skin.  Other problems with the heart, kidneys, or brain. DIAGNOSIS  Your health care provider will likely do tests to look for an infection, to see if the infection has  spread to your blood, and to see how serious your condition is. Tests can include:  Blood tests, including cultures of your blood.  Cultures of other fluids from your body, such as:  Urine.  Pus from wounds.  Mucus coughed up from your lungs.  Urine tests other than cultures.  X-ray exams or other imaging tests. TREATMENT  Treatment will begin with elimination of the source of infection. If your sepsis is likely caused by a bacterial or fungal infection, you will be given antibiotic or antifungal medicines. You may also receive:  Oxygen.  Fluids through an IV tube.  Medicines to increase your blood pressure.  A machine to clean your blood (dialysis) if your kidneys fail.  A machine to help you breathe if your lungs fail. SEEK IMMEDIATE MEDICAL CARE IF: You get an infection or develop any of the signs and symptoms of sepsis after surgery or a hospitalization.   This information is not intended to replace advice given to you by your health care provider. Make sure you discuss any questions you have with your health care provider.   Document Released: 09/02/2003 Document Revised: 04/20/2015 Document Reviewed: 08/11/2013 Elsevier Interactive Patient Education [employer].

## 2015-12-27 NOTE — Care Management Important Message (Signed)
Important Message  Patient Details  Name: [name] MRN: [id number] Date of Birth: 21-Jun-1936   Medicare Important Message Given:  Yes    Bethena Roys, RN 12/27/2015, 11:09 AM
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
## 2015-12-27 NOTE — Care Management Note (Signed)
Case Management Note  Patient Details  Name: [name] MRN: [id number] Date of Birth: 06/25/1936  Subjective/Objective:     Pt admitted for Sepsis. Plan for home today with po Antibiotic therapy.  Pt is from home with wife and per pt he ambulates well.               Action/Plan: No needs identified by CM at this time.    Expected Discharge Date:                  Expected Discharge Plan:  Home/Self Care  In-House Referral:  NA  Discharge planning Services  CM Consult  Post Acute Care Choice:  NA Choice offered to:  NA  DME Arranged:  N/A DME Agency:  NA  HH Arranged:  NA HH Agency:  NA  Status of Service:  Completed, signed off  Medicare Important Message Given:  Yes Date Medicare IM Given:    Medicare IM give by:    Date Additional Medicare IM Given:    Additional Medicare Important Message give by:     If discussed at [place] of Stay Meetings, dates discussed:    Additional Comments:  Bethena Roys, RN 12/27/2015, 11:11 AM

## 2015-12-29 LAB — CULTURE, BLOOD (ROUTINE X 2)
CULTURE: NO GROWTH
CULTURE: NO GROWTH

## 2016-01-02 IMAGING — US US EXTREM LOW VENOUS*R*
1 series · 13 of 24 positions shown · non-contrast
Comparison: None.

CLINICAL DATA: Right lower extremity swelling.



[Series 1: us extrem low venous*right* · 13 of 41 slices shown]
[im 1/41]
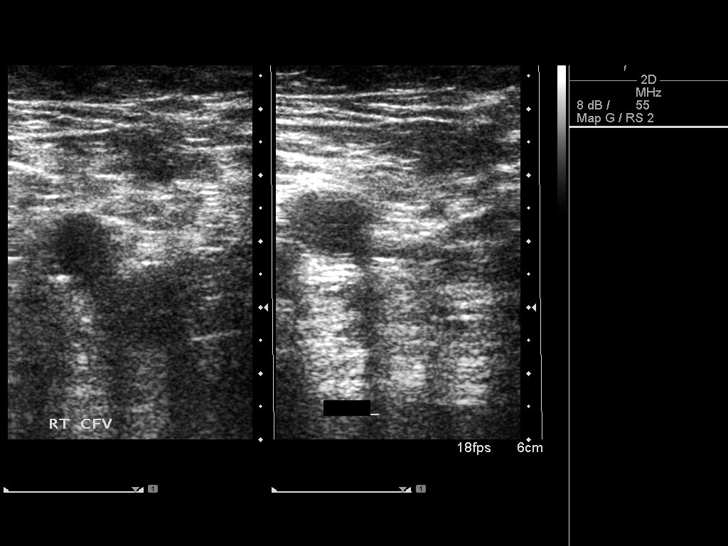
[im 4/41]
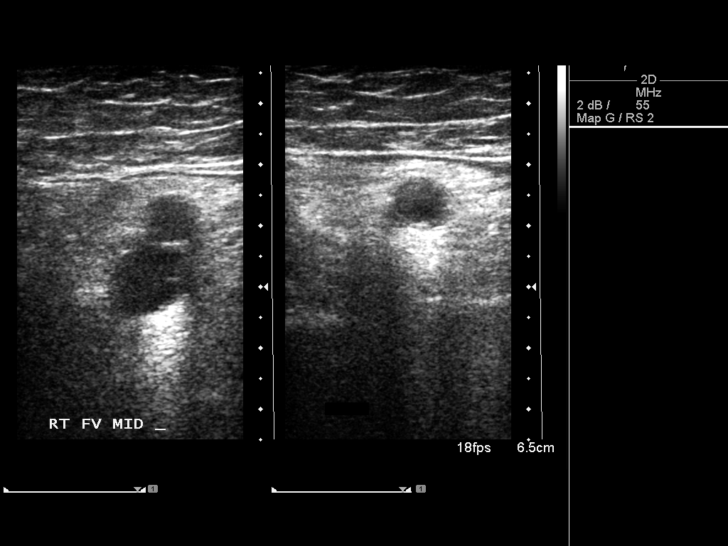
[im 7/41]
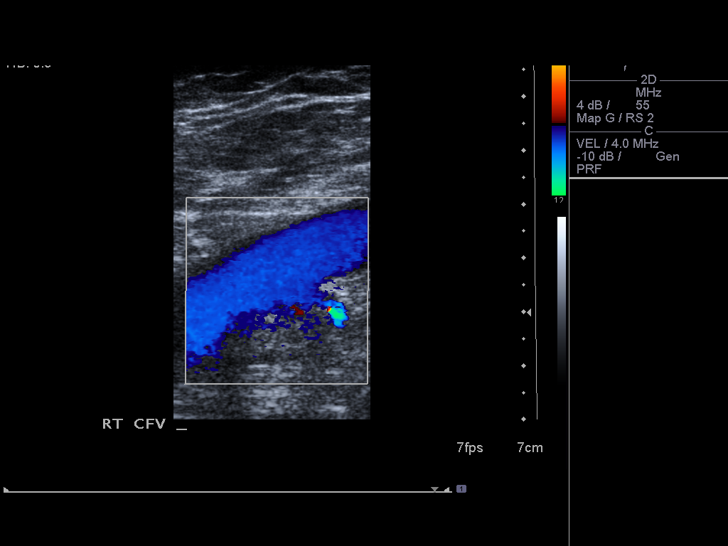
[im 11/41]
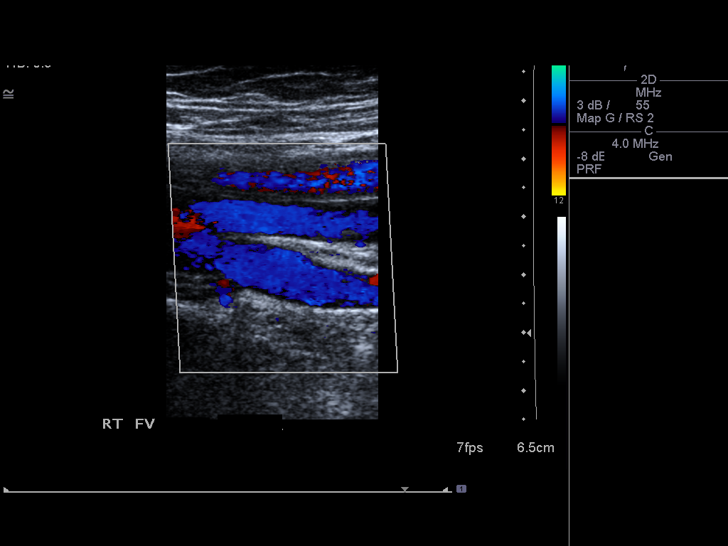
[im 14/41]
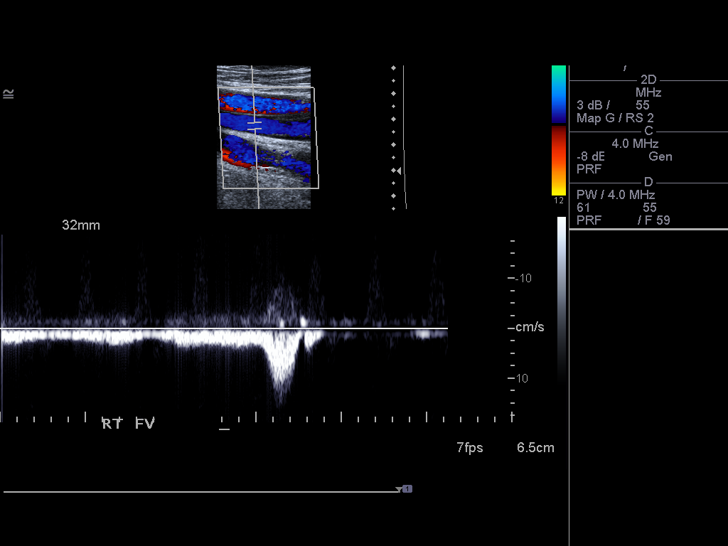
[im 18/41]
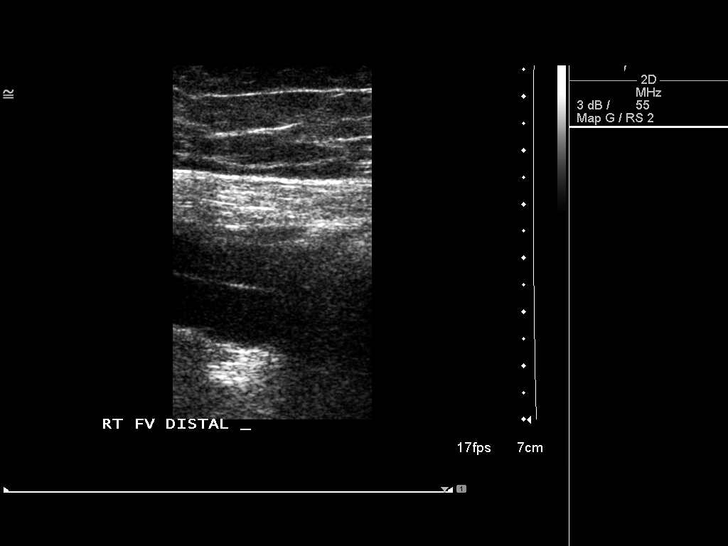
[im 21/41]
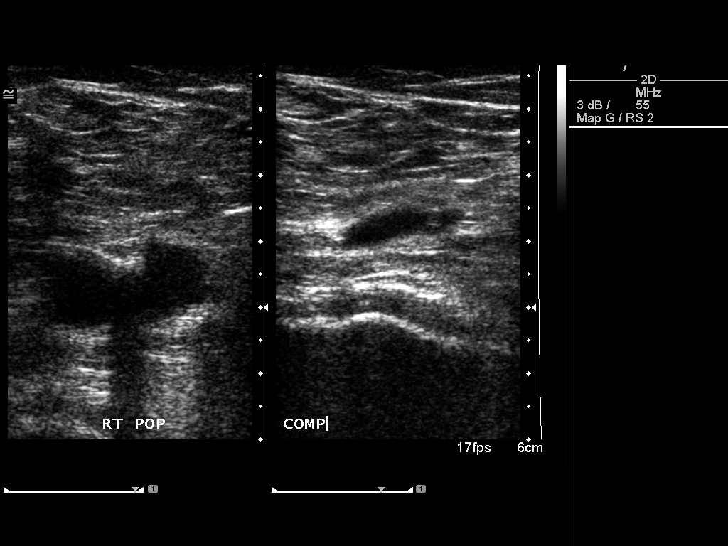
[im 23/41]
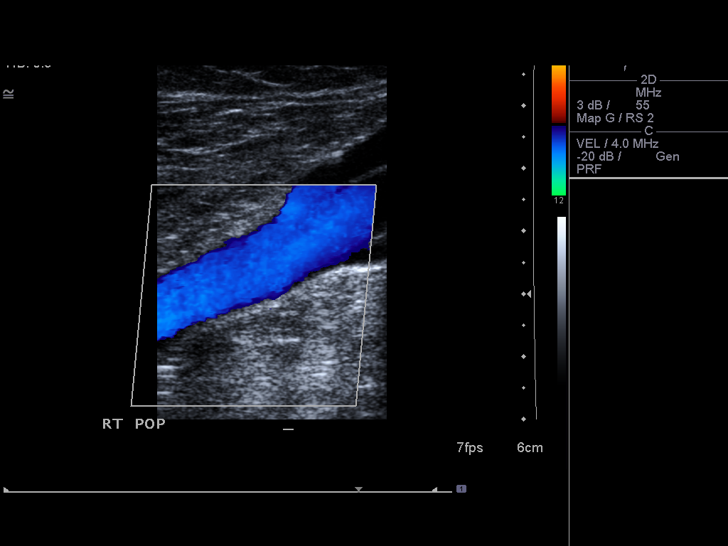
[im 27/41]
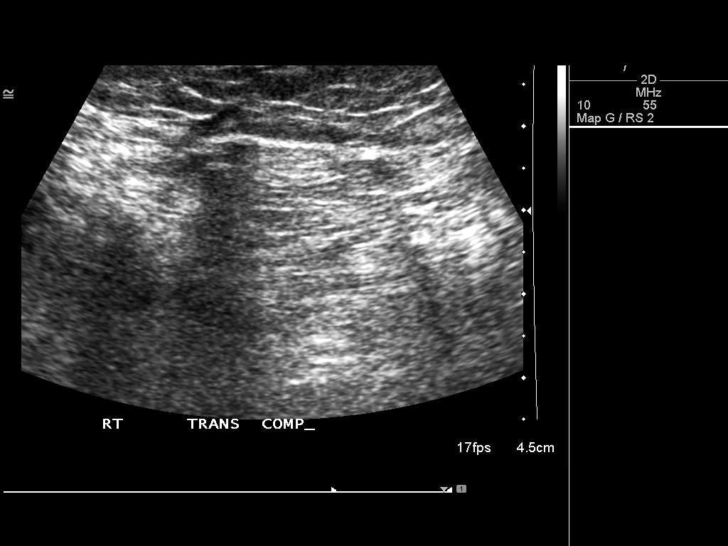
[im 30/41]
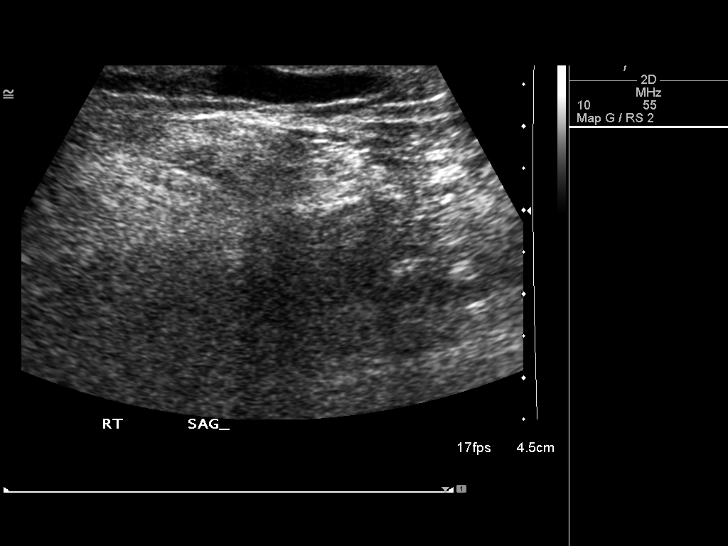
[im 34/41]
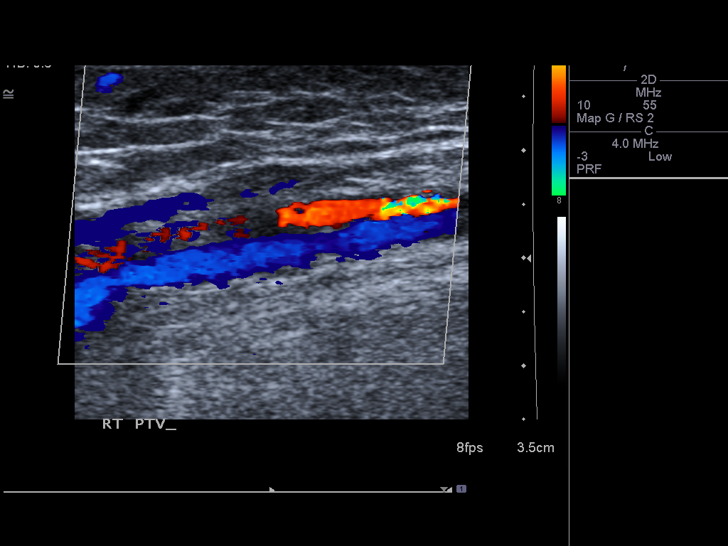
[im 37/41]
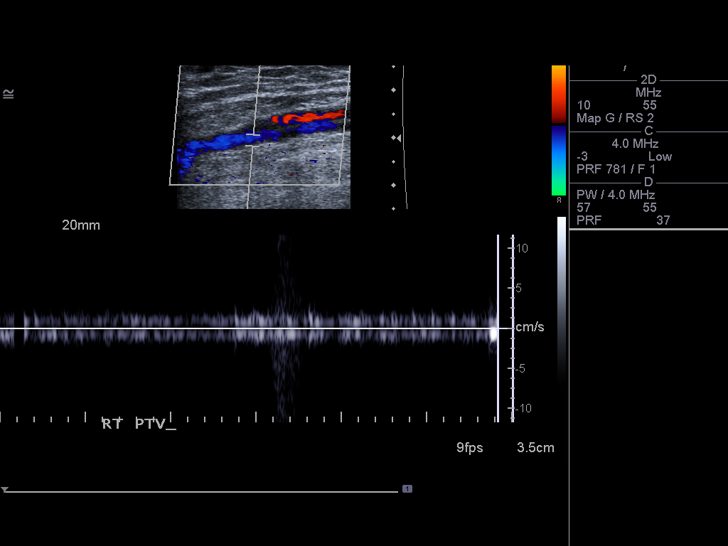
[im 41/41]
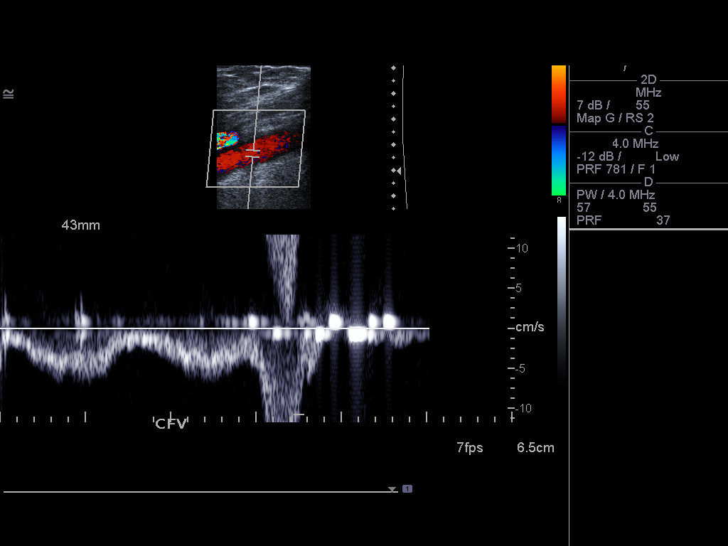

[13 of 24 positions shown; findings below may reference images not displayed]

FINDINGS: Contralateral Common Femoral Vein: Respiratory phasicity is normal
and symmetric with the symptomatic side. No evidence of thrombus.
Normal compressibility.

Common Femoral Vein: No evidence of thrombus. Normal
compressibility, respiratory phasicity and response to augmentation.

Saphenofemoral Junction: No evidence of thrombus. Normal
compressibility and flow on color Doppler imaging.

Profunda Femoral Vein: No evidence of thrombus. Normal
compressibility and flow on color Doppler imaging.

Femoral Vein: No evidence of thrombus. Normal compressibility,
respiratory phasicity and response to augmentation.

Popliteal Vein: No evidence of thrombus. Normal compressibility,
respiratory phasicity and response to augmentation.

Calf Veins: No evidence of thrombus. Normal compressibility and flow
on color Doppler imaging.

Superficial Great Saphenous Vein: No evidence of thrombus. Normal
compressibility and flow on color Doppler imaging.

Venous Reflux:  None.

Other Findings:  Varicose veins are noted superficially.
IMPRESSION: No evidence of deep venous thrombosis.

## 2016-01-11 ENCOUNTER — Ambulatory Visit: Payer: Medicare Other

## 2016-01-17 ENCOUNTER — Ambulatory Visit: Payer: Medicare Other | Attending: Internal Medicine

## 2016-01-17 DIAGNOSIS — R2681 Unsteadiness on feet: Secondary | ICD-10-CM | POA: Diagnosis present

## 2016-01-17 DIAGNOSIS — R531 Weakness: Secondary | ICD-10-CM | POA: Insufficient documentation
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
## 2016-01-17 NOTE — Patient Instructions (Signed)
KNEE: Extension, Long Arc Quad (Weight)  Place weight around leg. Raise leg until knee is straight. Hold _5__ seconds. Use ___ lb weight. _10__ reps per set (each leg), 4-5__ sets per day, __7_ days per week    Knee Raise   Lift knee and then lower it. Repeat with other knee. Repeat _10__ times each leg. Do _4-5___ sessions per day.  http://gt2.exer.us/445   Copyright  VHI. All rights reserved.  Toe Up   Gently rise up on toes and back on heels. Repeat _20___ times. Do 4-5____ sessions per day.  [facility] [address], [address] [place], [place] [address] Phone # [phone] Fax [phone]  

## 2016-01-19 ENCOUNTER — Encounter: Payer: Self-pay | Admitting: Physical Therapy

## 2016-01-19 ENCOUNTER — Ambulatory Visit: Payer: Medicare Other | Attending: Internal Medicine | Admitting: Physical Therapy

## 2016-01-19 DIAGNOSIS — R531 Weakness: Secondary | ICD-10-CM | POA: Insufficient documentation

## 2016-01-19 DIAGNOSIS — R29898 Other symptoms and signs involving the musculoskeletal system: Secondary | ICD-10-CM | POA: Diagnosis present

## 2016-01-19 DIAGNOSIS — M6281 Muscle weakness (generalized): Secondary | ICD-10-CM | POA: Insufficient documentation

## 2016-01-19 DIAGNOSIS — M545 Low back pain, unspecified: Secondary | ICD-10-CM

## 2016-01-19 DIAGNOSIS — R2681 Unsteadiness on feet: Secondary | ICD-10-CM | POA: Insufficient documentation
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
## 2016-01-24 ENCOUNTER — Ambulatory Visit: Payer: Medicare Other | Admitting: Physical Therapy

## 2016-01-24 ENCOUNTER — Encounter: Payer: Self-pay | Admitting: Physical Therapy

## 2016-01-24 DIAGNOSIS — R531 Weakness: Secondary | ICD-10-CM | POA: Diagnosis not present

## 2016-01-24 DIAGNOSIS — R2681 Unsteadiness on feet: Secondary | ICD-10-CM

## 2016-01-24 DIAGNOSIS — R29898 Other symptoms and signs involving the musculoskeletal system: Secondary | ICD-10-CM

## 2016-01-24 DIAGNOSIS — M545 Low back pain, unspecified: Secondary | ICD-10-CM
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
## 2016-01-26 ENCOUNTER — Encounter: Payer: Medicare Other | Admitting: Physical Therapy

## 2016-01-27 ENCOUNTER — Ambulatory Visit: Payer: Medicare Other | Admitting: Physical Therapy

## 2016-01-27 DIAGNOSIS — M545 Low back pain, unspecified: Secondary | ICD-10-CM

## 2016-01-27 DIAGNOSIS — R531 Weakness: Secondary | ICD-10-CM | POA: Diagnosis not present

## 2016-01-27 DIAGNOSIS — R29898 Other symptoms and signs involving the musculoskeletal system: Secondary | ICD-10-CM

## 2016-01-27 DIAGNOSIS — R2681 Unsteadiness on feet: Secondary | ICD-10-CM
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
## 2016-01-31 ENCOUNTER — Encounter: Payer: Self-pay | Admitting: Physical Therapy

## 2016-01-31 ENCOUNTER — Ambulatory Visit: Payer: Medicare Other | Admitting: Physical Therapy

## 2016-01-31 DIAGNOSIS — M545 Low back pain, unspecified: Secondary | ICD-10-CM

## 2016-01-31 DIAGNOSIS — R531 Weakness: Secondary | ICD-10-CM | POA: Diagnosis not present

## 2016-01-31 DIAGNOSIS — R2681 Unsteadiness on feet: Secondary | ICD-10-CM

## 2016-01-31 DIAGNOSIS — R29898 Other symptoms and signs involving the musculoskeletal system: Secondary | ICD-10-CM
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
## 2016-02-02 ENCOUNTER — Ambulatory Visit: Payer: Medicare Other | Admitting: Physical Therapy

## 2016-02-02 ENCOUNTER — Encounter: Payer: Self-pay | Admitting: Physical Therapy

## 2016-02-02 DIAGNOSIS — R2681 Unsteadiness on feet: Secondary | ICD-10-CM

## 2016-02-02 DIAGNOSIS — R531 Weakness: Secondary | ICD-10-CM | POA: Diagnosis not present

## 2016-02-02 DIAGNOSIS — M545 Low back pain, unspecified: Secondary | ICD-10-CM

## 2016-02-02 DIAGNOSIS — R29898 Other symptoms and signs involving the musculoskeletal system: Secondary | ICD-10-CM
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
## 2016-02-07 ENCOUNTER — Encounter: Payer: Self-pay | Admitting: Physical Therapy

## 2016-02-07 ENCOUNTER — Ambulatory Visit: Payer: Medicare Other | Admitting: Physical Therapy

## 2016-02-07 DIAGNOSIS — R29898 Other symptoms and signs involving the musculoskeletal system: Secondary | ICD-10-CM

## 2016-02-07 DIAGNOSIS — R531 Weakness: Secondary | ICD-10-CM | POA: Diagnosis not present

## 2016-02-07 DIAGNOSIS — M545 Low back pain, unspecified: Secondary | ICD-10-CM

## 2016-02-07 DIAGNOSIS — R2681 Unsteadiness on feet: Secondary | ICD-10-CM
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
## 2016-02-09 ENCOUNTER — Encounter: Payer: Self-pay | Admitting: Physical Therapy

## 2016-02-09 ENCOUNTER — Ambulatory Visit: Payer: Medicare Other | Admitting: Physical Therapy

## 2016-02-09 DIAGNOSIS — R531 Weakness: Secondary | ICD-10-CM | POA: Diagnosis not present

## 2016-02-09 DIAGNOSIS — R2681 Unsteadiness on feet: Secondary | ICD-10-CM
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
## 2016-02-14 ENCOUNTER — Ambulatory Visit: Payer: Medicare Other | Admitting: Physical Therapy

## 2016-02-14 ENCOUNTER — Encounter: Payer: Self-pay | Admitting: Physical Therapy

## 2016-02-14 DIAGNOSIS — R531 Weakness: Secondary | ICD-10-CM | POA: Diagnosis not present

## 2016-02-14 DIAGNOSIS — R2681 Unsteadiness on feet: Secondary | ICD-10-CM
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
## 2016-02-17 ENCOUNTER — Ambulatory Visit: Payer: Medicare Other | Attending: Internal Medicine

## 2016-02-17 DIAGNOSIS — R531 Weakness: Secondary | ICD-10-CM | POA: Diagnosis not present

## 2016-02-17 DIAGNOSIS — R2681 Unsteadiness on feet: Secondary | ICD-10-CM
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
## 2016-04-07 ENCOUNTER — Ambulatory Visit
Admission: RE | Admit: 2016-04-07 | Discharge: 2016-04-07 | Disposition: A | Discharge status: Home / Self Care | Payer: Medicare Other | Source: Ambulatory Visit | Attending: Neurological Surgery | Admitting: Neurological Surgery

## 2016-04-07 ENCOUNTER — Other Ambulatory Visit: Payer: Self-pay | Admitting: Neurological Surgery

## 2016-04-07 DIAGNOSIS — M21371 Foot drop, right foot: Secondary | ICD-10-CM

## 2016-04-11 ENCOUNTER — Other Ambulatory Visit: Payer: Self-pay | Admitting: Neurological Surgery

## 2016-04-11 ENCOUNTER — Other Ambulatory Visit (HOSPITAL_COMMUNITY): Payer: Self-pay | Admitting: Neurological Surgery

## 2016-04-11 DIAGNOSIS — M5416 Radiculopathy, lumbar region: Secondary | ICD-10-CM

## 2016-04-13 ENCOUNTER — Ambulatory Visit (HOSPITAL_COMMUNITY)
Admission: RE | Admit: 2016-04-13 | Discharge: 2016-04-13 | Disposition: A | Discharge status: Home / Self Care | Payer: Medicare Other | Source: Ambulatory Visit | Attending: Neurological Surgery | Admitting: Neurological Surgery

## 2016-04-13 DIAGNOSIS — I700 Atherosclerosis of aorta: Secondary | ICD-10-CM | POA: Diagnosis not present

## 2016-04-13 DIAGNOSIS — M4806 Spinal stenosis, lumbar region: Secondary | ICD-10-CM | POA: Insufficient documentation

## 2016-04-13 DIAGNOSIS — M4726 Other spondylosis with radiculopathy, lumbar region: Secondary | ICD-10-CM | POA: Insufficient documentation

## 2016-04-13 DIAGNOSIS — M4316 Spondylolisthesis, lumbar region: Secondary | ICD-10-CM | POA: Insufficient documentation

## 2016-04-13 DIAGNOSIS — M5416 Radiculopathy, lumbar region: Secondary | ICD-10-CM

## 2016-04-13 DIAGNOSIS — M5136 Other intervertebral disc degeneration, lumbar region: Secondary | ICD-10-CM | POA: Diagnosis not present

## 2016-04-13 DIAGNOSIS — M21371 Foot drop, right foot: Secondary | ICD-10-CM | POA: Insufficient documentation

## 2016-04-13 MED ORDER — OXYCODONE-ACETAMINOPHEN 5-325 MG PO TABS
ORAL_TABLET | ORAL | Status: AC
  Filled 2016-04-13: qty 1

## 2016-04-13 MED ORDER — ONDANSETRON HCL 4 MG/2ML IJ SOLN: 4.0000 mg | Freq: Four times a day (QID) | INTRAMUSCULAR | Status: DC | PRN

## 2016-04-13 MED ORDER — DIAZEPAM 5 MG PO TABS
10.0000 mg | ORAL_TABLET | Freq: Once | ORAL | Status: AC
  Administered 2016-04-13: 10 mg via ORAL

## 2016-04-13 MED ORDER — IOHEXOL 180 MG/ML  SOLN
20.0000 mL | Freq: Once | INTRAMUSCULAR | Status: AC | PRN
  Administered 2016-04-13: 12 mL via INTRATHECAL

## 2016-04-13 MED ORDER — LIDOCAINE HCL (PF) 1 % IJ SOLN
INTRAMUSCULAR | Status: AC
  Administered 2016-04-13: 5 mL via INTRAMUSCULAR
  Filled 2016-04-13: qty 5

## 2016-04-13 MED ORDER — OXYCODONE-ACETAMINOPHEN 5-325 MG PO TABS
2.0000 | ORAL_TABLET | ORAL | Status: DC | PRN
  Administered 2016-04-13: 2 via ORAL
  Filled 2016-04-13: qty 2

## 2016-04-13 MED ORDER — DIAZEPAM 5 MG PO TABS
ORAL_TABLET | ORAL | Status: AC
  Administered 2016-04-13: 10 mg via ORAL
  Filled 2016-04-13: qty 2

## 2016-04-13 MED ORDER — DIAZEPAM 5 MG PO TABS
10.0000 mg | ORAL_TABLET | Freq: Once | ORAL | Status: DC
  Filled 2016-04-13: qty 2

## 2016-04-13 NOTE — Discharge Instructions (Signed)
Myelography, Care After °These instructions give you information on caring for yourself after your procedure. Your doctor may also give you more specific instructions. Call your doctor if you have any problems or questions after your procedure. °HOME CARE °· Rest the first day. °· When you rest, lie flat, with your head slightly raised (elevated). °· Avoid heavy lifting and activity for 48 hours, or as told by your doctor. °· You may take the bandage (dressing) off one day after the test, or as told by your doctor. °· Take all medicines only as told by your doctor. °· Ask your doctor when it is okay to take a shower or bath. °· Ask your doctor when your test results will be ready and how you can get them. Make sure you follow up and get your results. °· Do not drink alcohol for 24 hours, or as told by your doctor. °· Drink enough fluid to keep your pee (urine) clear or pale yellow. °GET HELP IF:  °· You have a fever. °· You have a headache. °· You feel sick to your stomach (nauseous) or throw up (vomit). °· You have pain or cramping in your belly (abdomen). °GET HELP RIGHT AWAY IF:  °· You have a headache with a stiff neck or fever. °· You have trouble breathing. °· Any of the places where the needles were put in are: °¨ Puffy (swollen) or red. °¨ Sore or hot to the touch. °¨ Draining yellowish-white fluid (pus). °¨ Bleeding. °MAKE SURE YOU: °· Understand these instructions. °· Will watch your condition. °· Will get help right away if you are not doing well or get worse. °  °This information is not intended to replace advice given to you by your health care provider. Make sure you discuss any questions you have with your health care provider. °  °Document Released: 09/12/2008 Document Revised: 12/25/2014 Document Reviewed: 08/28/2012 °Elsevier Interactive Patient Education ©2016 Else[employer] Inc[employer] ° °

## 2016-04-13 NOTE — Procedures (Signed)
The patient is a 80 year old individual who has had progressively worsening back pain with right lumbar radiculopathy. The pain has become intractable. He could not cooperate with an MRI despite given significant sedation. A myelogram has been suggested in an effort to image his spine where he has evidence of significant spondylitic degenerative changes on plain x-ray.  Pre op Dx: Lumbar radiculopathy, lumbar spondylosis Post op Dx: Same Procedure: Lumbar myelogram Surgeon: Taiwan Millon Puncture level: L2-3 Fluid color: Clear colorless Injection: Iohexol 180. 12 mL Findings: Severe stenosis at the level of L2-3. With weightbearing down I does flow through. Further evaluation with CT scanning.

## 2016-04-19 ENCOUNTER — Other Ambulatory Visit: Payer: Self-pay | Admitting: Neurological Surgery

## 2016-04-24 NOTE — Progress Notes (Signed)
Pt denies SOB, chest pain, and being under the care of a cardiologist. Pt denies having a stress test, echo and cardiac cath. Pt stated that " he is not sure if he had an EKG or chest x ray within the last year; contacted PCP, Dr. Shon Baton. Pt denies having any labs drawn within the last 2 weeks. STOP-Bang Assessment incomplete, please add BMI on DOS. Pt made aware to stop taking  Aspirin, all vitamins, fish oil and herbal medications such as Garlic, Beta carotene, Glucosamine, Soya Lecithin, Vitamin C and E. Do not take any NSAIDs ie: Ibuprofen, Advil, Naproxen, BC and Goody Powder or any medication containing Aspirin. Cherlynn Polo, PA, Anesthesia, aware that pt had not stopped multivitamins, fish oil and herbal medications ( last dose today), Ebony Hail, [place], advised that I make MD aware. Left voice message on Jessica's  voice mailbox, Surgical Scheduler for Dr. Ellene Route, to make MD aware that pt had not stopped taking multivitamins, fish oil and herbal medications such as Garlic, Beta carotene, Glucosamine, Soya Lecithin, Vitamin C and E and last dose was today. Pt verbalized understanding of all pre-op instructions.

## 2016-04-25 ENCOUNTER — Inpatient Hospital Stay (HOSPITAL_COMMUNITY): Payer: Self-pay | Admitting: Anesthesiology

## 2016-04-25 ENCOUNTER — Encounter (HOSPITAL_COMMUNITY)
Admission: RE | Disposition: A | Discharge status: Skilled Nursing Facility | Payer: Self-pay | Source: Ambulatory Visit | Attending: Neurological Surgery

## 2016-04-25 ENCOUNTER — Inpatient Hospital Stay (HOSPITAL_COMMUNITY): Payer: Self-pay

## 2016-04-25 ENCOUNTER — Inpatient Hospital Stay (HOSPITAL_COMMUNITY)
Admission: RE | Admit: 2016-04-25 | Discharge: 2016-05-02 | DRG: 460 | Disposition: A | Discharge status: Skilled Nursing Facility | Payer: Medicare Other | Source: Ambulatory Visit | Attending: Neurological Surgery | Admitting: Neurological Surgery

## 2016-04-25 ENCOUNTER — Inpatient Hospital Stay (HOSPITAL_COMMUNITY): Payer: Medicare Other | Admitting: Anesthesiology

## 2016-04-25 DIAGNOSIS — Z9841 Cataract extraction status, right eye: Secondary | ICD-10-CM | POA: Diagnosis not present

## 2016-04-25 DIAGNOSIS — Z9842 Cataract extraction status, left eye: Secondary | ICD-10-CM | POA: Diagnosis not present

## 2016-04-25 DIAGNOSIS — M79605 Pain in left leg: Secondary | ICD-10-CM

## 2016-04-25 DIAGNOSIS — Z961 Presence of intraocular lens: Secondary | ICD-10-CM | POA: Diagnosis present

## 2016-04-25 DIAGNOSIS — D62 Acute posthemorrhagic anemia: Secondary | ICD-10-CM | POA: Diagnosis not present

## 2016-04-25 DIAGNOSIS — E7800 Pure hypercholesterolemia, unspecified: Secondary | ICD-10-CM | POA: Diagnosis present

## 2016-04-25 DIAGNOSIS — Z87891 Personal history of nicotine dependence: Secondary | ICD-10-CM | POA: Diagnosis not present

## 2016-04-25 DIAGNOSIS — Z419 Encounter for procedure for purposes other than remedying health state, unspecified: Secondary | ICD-10-CM

## 2016-04-25 DIAGNOSIS — N179 Acute kidney failure, unspecified: Secondary | ICD-10-CM | POA: Diagnosis present

## 2016-04-25 DIAGNOSIS — M4726 Other spondylosis with radiculopathy, lumbar region: Principal | ICD-10-CM | POA: Diagnosis present

## 2016-04-25 DIAGNOSIS — I959 Hypotension, unspecified: Secondary | ICD-10-CM | POA: Diagnosis present

## 2016-04-25 DIAGNOSIS — M4806 Spinal stenosis, lumbar region: Secondary | ICD-10-CM | POA: Diagnosis present

## 2016-04-25 DIAGNOSIS — Z7982 Long term (current) use of aspirin: Secondary | ICD-10-CM | POA: Diagnosis not present

## 2016-04-25 DIAGNOSIS — Z8546 Personal history of malignant neoplasm of prostate: Secondary | ICD-10-CM | POA: Diagnosis not present

## 2016-04-25 DIAGNOSIS — S39012A Strain of muscle, fascia and tendon of lower back, initial encounter: Secondary | ICD-10-CM

## 2016-04-25 HISTORY — DX: Pure hypercholesterolemia, unspecified: E78.00

## 2016-04-25 HISTORY — DX: Radiculopathy, lumbar region: M54.16

## 2016-04-25 HISTORY — DX: Malignant (primary) neoplasm, unspecified: C80.1

## 2016-04-25 LAB — BASIC METABOLIC PANEL
Anion gap: 11 (ref 5–15)
BUN: 18 mg/dL (ref 6–20)
CHLORIDE: 106 mmol/L (ref 101–111)
CO2: 23 mmol/L (ref 22–32)
Calcium: 9.3 mg/dL (ref 8.9–10.3)
Creatinine, Ser: 1.06 mg/dL (ref 0.61–1.24)
GFR calc Af Amer: >60 mL/min (ref >60)
GLUCOSE: 100 mg/dL — AB (ref 65–99)
POTASSIUM: 4.4 mmol/L (ref 3.5–5.1)
Sodium: 140 mmol/L (ref 135–145)

## 2016-04-25 LAB — CBC
HEMATOCRIT: 47.1 % (ref 39.0–52.0)
Hemoglobin: 15.3 g/dL (ref 13.0–17.0)
MCH: 30.7 pg (ref 26.0–34.0)
MCHC: 32.5 g/dL (ref 30.0–36.0)
MCV: 94.4 fL (ref 78.0–100.0)
Platelets: 186 10*3/uL (ref 150–400)
RBC: 4.99 MIL/uL (ref 4.22–5.81)
RDW: 14.3 % (ref 11.5–15.5)
WBC: 9.7 10*3/uL (ref 4.0–10.5)

## 2016-04-25 LAB — TYPE AND SCREEN
ABO/RH(D): A NEG
Antibody Screen: NEGATIVE

## 2016-04-25 LAB — SURGICAL PCR SCREEN
MRSA, PCR: NEGATIVE
Staphylococcus aureus: NEGATIVE

## 2016-04-25 LAB — ABO/RH: ABO/RH(D): A NEG

## 2016-04-25 SURGERY — POSTERIOR LUMBAR FUSION 3 LEVEL
Anesthesia: General | Site: Back

## 2016-04-25 MED ORDER — CEFAZOLIN SODIUM-DEXTROSE 2-4 GM/100ML-% IV SOLN
2.0000 g | INTRAVENOUS | Status: AC
  Administered 2016-04-25 (×2): 2 g via INTRAVENOUS

## 2016-04-25 MED ORDER — FENTANYL CITRATE (PF) 250 MCG/5ML IJ SOLN
INTRAMUSCULAR | Status: AC
  Filled 2016-04-25: qty 5

## 2016-04-25 MED ORDER — BISACODYL 10 MG RE SUPP: 10.0000 mg | Freq: Every day | RECTAL | Status: DC | PRN

## 2016-04-25 MED ORDER — HYDROMORPHONE HCL 1 MG/ML IJ SOLN
INTRAMUSCULAR | Status: AC
  Filled 2016-04-25: qty 1

## 2016-04-25 MED ORDER — DOCUSATE SODIUM 100 MG PO CAPS
100.0000 mg | ORAL_CAPSULE | Freq: Two times a day (BID) | ORAL | Status: DC
  Administered 2016-04-25 – 2016-04-30 (×9): 100 mg via ORAL
  Filled 2016-04-25 (×11): qty 1

## 2016-04-25 MED ORDER — SUGAMMADEX SODIUM 200 MG/2ML IV SOLN
INTRAVENOUS | Status: DC | PRN
  Administered 2016-04-25: 226.8 mg via INTRAVENOUS

## 2016-04-25 MED ORDER — METHOCARBAMOL 500 MG PO TABS
ORAL_TABLET | ORAL | Status: AC
  Filled 2016-04-25: qty 1

## 2016-04-25 MED ORDER — HYDROMORPHONE HCL 1 MG/ML IJ SOLN: 0.2500 mg | INTRAMUSCULAR | Status: DC | PRN

## 2016-04-25 MED ORDER — SURGIFOAM 100 EX MISC
CUTANEOUS | Status: DC | PRN
  Administered 2016-04-25: 14:00:00 via TOPICAL

## 2016-04-25 MED ORDER — ONDANSETRON HCL 4 MG/2ML IJ SOLN
INTRAMUSCULAR | Status: DC | PRN
  Administered 2016-04-25: 4 mg via INTRAVENOUS

## 2016-04-25 MED ORDER — MUPIROCIN 2 % EX OINT
TOPICAL_OINTMENT | CUTANEOUS | Status: AC
  Administered 2016-04-25: 11:00:00
  Filled 2016-04-25: qty 22

## 2016-04-25 MED ORDER — HYDROMORPHONE HCL 1 MG/ML IJ SOLN
0.2500 mg | INTRAMUSCULAR | Status: DC | PRN
  Administered 2016-04-25 (×4): 0.5 mg via INTRAVENOUS

## 2016-04-25 MED ORDER — SIMVASTATIN 40 MG PO TABS
40.0000 mg | ORAL_TABLET | Freq: Every day | ORAL | Status: DC
  Administered 2016-04-25 – 2016-05-02 (×8): 40 mg via ORAL
  Filled 2016-04-25 (×8): qty 1

## 2016-04-25 MED ORDER — KETOROLAC TROMETHAMINE 15 MG/ML IJ SOLN
15.0000 mg | Freq: Four times a day (QID) | INTRAMUSCULAR | Status: AC
  Administered 2016-04-25 – 2016-04-26 (×5): 15 mg via INTRAVENOUS
  Filled 2016-04-25 (×4): qty 1

## 2016-04-25 MED ORDER — ARTIFICIAL TEARS OP OINT
TOPICAL_OINTMENT | OPHTHALMIC | Status: AC
  Filled 2016-04-25: qty 3.5

## 2016-04-25 MED ORDER — CEFAZOLIN SODIUM-DEXTROSE 2-4 GM/100ML-% IV SOLN
INTRAVENOUS | Status: DC
Start: 2016-04-25 — End: 2016-06-19
  Filled 2016-04-25: qty 100

## 2016-04-25 MED ORDER — LACTATED RINGERS IV SOLN
INTRAVENOUS | Status: DC
  Administered 2016-04-25: 11:00:00 via INTRAVENOUS

## 2016-04-25 MED ORDER — SODIUM CHLORIDE 0.9 % IV SOLN
INTRAVENOUS | Status: DC
  Administered 2016-04-25 – 2016-04-28 (×2): via INTRAVENOUS

## 2016-04-25 MED ORDER — SODIUM CHLORIDE 0.9% FLUSH
3.0000 mL | Freq: Two times a day (BID) | INTRAVENOUS | Status: DC
  Administered 2016-04-25 – 2016-04-29 (×5): 3 mL via INTRAVENOUS

## 2016-04-25 MED ORDER — POLYETHYLENE GLYCOL 3350 17 G PO PACK: 17.0000 g | PACK | Freq: Every day | ORAL | Status: DC | PRN

## 2016-04-25 MED ORDER — DEXAMETHASONE SODIUM PHOSPHATE 10 MG/ML IJ SOLN
INTRAMUSCULAR | Status: DC | PRN
  Administered 2016-04-25: 10 mg via INTRAVENOUS

## 2016-04-25 MED ORDER — KETOROLAC TROMETHAMINE 15 MG/ML IJ SOLN
INTRAMUSCULAR | Status: DC
Start: 2016-04-25 — End: 2016-06-19
  Filled 2016-04-25: qty 1

## 2016-04-25 MED ORDER — ROCURONIUM BROMIDE 100 MG/10ML IV SOLN
INTRAVENOUS | Status: DC | PRN
  Administered 2016-04-25 (×4): 10 mg via INTRAVENOUS
  Administered 2016-04-25: 50 mg via INTRAVENOUS
  Administered 2016-04-25 (×4): 10 mg via INTRAVENOUS

## 2016-04-25 MED ORDER — ALBUMIN HUMAN 5 % IV SOLN
INTRAVENOUS | Status: DC | PRN
  Administered 2016-04-25: 17:00:00 via INTRAVENOUS

## 2016-04-25 MED ORDER — ALUM & MAG HYDROXIDE-SIMETH 200-200-20 MG/5ML PO SUSP: 30.0000 mL | Freq: Four times a day (QID) | ORAL | Status: DC | PRN

## 2016-04-25 MED ORDER — ONDANSETRON HCL 4 MG/2ML IJ SOLN: 4.0000 mg | INTRAMUSCULAR | Status: DC | PRN

## 2016-04-25 MED ORDER — MENTHOL 3 MG MT LOZG: 1.0000 | LOZENGE | OROMUCOSAL | Status: DC | PRN

## 2016-04-25 MED ORDER — METHOCARBAMOL 500 MG PO TABS
500.0000 mg | ORAL_TABLET | Freq: Four times a day (QID) | ORAL | Status: DC | PRN
  Administered 2016-04-25 – 2016-04-26 (×3): 500 mg via ORAL
  Filled 2016-04-25 (×2): qty 1

## 2016-04-25 MED ORDER — PHENYLEPHRINE HCL 10 MG/ML IJ SOLN
10.0000 mg | INTRAVENOUS | Status: DC | PRN
  Administered 2016-04-25: 30 ug/min via INTRAVENOUS

## 2016-04-25 MED ORDER — CEFAZOLIN SODIUM 1-5 GM-% IV SOLN
1.0000 g | Freq: Three times a day (TID) | INTRAVENOUS | Status: AC
Start: 2016-04-25 — End: 2016-04-26
  Administered 2016-04-25 – 2016-04-26 (×2): 1 g via INTRAVENOUS
  Filled 2016-04-25 (×2): qty 50

## 2016-04-25 MED ORDER — LIDOCAINE-EPINEPHRINE 1 %-1:100000 IJ SOLN
INTRAMUSCULAR | Status: DC | PRN
  Administered 2016-04-25: 10 mL

## 2016-04-25 MED ORDER — CEFAZOLIN SODIUM-DEXTROSE 2-4 GM/100ML-% IV SOLN
INTRAVENOUS | Status: AC
  Filled 2016-04-25: qty 100

## 2016-04-25 MED ORDER — LACTATED RINGERS IV SOLN
INTRAVENOUS | Status: DC | PRN
  Administered 2016-04-25 (×3): via INTRAVENOUS

## 2016-04-25 MED ORDER — SODIUM CHLORIDE 0.9 % IR SOLN
Status: DC | PRN
  Administered 2016-04-25: 14:00:00

## 2016-04-25 MED ORDER — FENTANYL CITRATE (PF) 100 MCG/2ML IJ SOLN
INTRAMUSCULAR | Status: DC | PRN
  Administered 2016-04-25: 150 ug via INTRAVENOUS
  Administered 2016-04-25: 50 ug via INTRAVENOUS
  Administered 2016-04-25 (×2): 100 ug via INTRAVENOUS
  Administered 2016-04-25 (×2): 50 ug via INTRAVENOUS

## 2016-04-25 MED ORDER — THROMBIN 5000 UNITS EX SOLR
OROMUCOSAL | Status: DC | PRN
  Administered 2016-04-25 (×3): via TOPICAL

## 2016-04-25 MED ORDER — METHOCARBAMOL 1000 MG/10ML IJ SOLN
500.0000 mg | Freq: Four times a day (QID) | INTRAVENOUS | Status: DC | PRN
  Filled 2016-04-25: qty 5

## 2016-04-25 MED ORDER — FLEET ENEMA 7-19 GM/118ML RE ENEM: 1.0000 | ENEMA | Freq: Once | RECTAL | Status: DC | PRN

## 2016-04-25 MED ORDER — PROPOFOL 10 MG/ML IV BOLUS
INTRAVENOUS | Status: AC
  Filled 2016-04-25: qty 40

## 2016-04-25 MED ORDER — ACETAMINOPHEN 325 MG PO TABS: 650.0000 mg | ORAL_TABLET | ORAL | Status: DC | PRN

## 2016-04-25 MED ORDER — LIDOCAINE HCL (CARDIAC) 20 MG/ML IV SOLN
INTRAVENOUS | Status: DC | PRN
  Administered 2016-04-25: 75 mg via INTRAVENOUS

## 2016-04-25 MED ORDER — PROPOFOL 10 MG/ML IV BOLUS
INTRAVENOUS | Status: DC | PRN
  Administered 2016-04-25: 17 mg via INTRAVENOUS

## 2016-04-25 MED ORDER — OXYCODONE-ACETAMINOPHEN 5-325 MG PO TABS
1.0000 | ORAL_TABLET | ORAL | Status: DC | PRN
  Administered 2016-04-25: 2 via ORAL
  Administered 2016-04-26 (×2): 1 via ORAL
  Administered 2016-04-26 – 2016-04-27 (×3): 2 via ORAL
  Administered 2016-04-27: 1 via ORAL
  Administered 2016-04-27 – 2016-05-01 (×4): 2 via ORAL
  Filled 2016-04-25: qty 1
  Filled 2016-04-25 (×4): qty 2
  Filled 2016-04-25: qty 1
  Filled 2016-04-25: qty 2
  Filled 2016-04-25: qty 1
  Filled 2016-04-25 (×3): qty 2

## 2016-04-25 MED ORDER — SENNA 8.6 MG PO TABS
1.0000 | ORAL_TABLET | Freq: Two times a day (BID) | ORAL | Status: DC
  Administered 2016-04-25 – 2016-04-30 (×9): 8.6 mg via ORAL
  Filled 2016-04-25 (×11): qty 1

## 2016-04-25 MED ORDER — DEXAMETHASONE SODIUM PHOSPHATE 10 MG/ML IJ SOLN
INTRAMUSCULAR | Status: AC
  Filled 2016-04-25: qty 1

## 2016-04-25 MED ORDER — ROCURONIUM BROMIDE 50 MG/5ML IV SOLN
INTRAVENOUS | Status: AC
  Filled 2016-04-25: qty 3

## 2016-04-25 MED ORDER — 0.9 % SODIUM CHLORIDE (POUR BTL) OPTIME
TOPICAL | Status: DC | PRN
  Administered 2016-04-25: 1000 mL

## 2016-04-25 MED ORDER — SODIUM CHLORIDE 0.9% FLUSH: 3.0000 mL | INTRAVENOUS | Status: DC | PRN

## 2016-04-25 MED ORDER — PHENOL 1.4 % MT LIQD: 1.0000 | OROMUCOSAL | Status: DC | PRN

## 2016-04-25 MED ORDER — SODIUM CHLORIDE 0.9 % IV SOLN: 250.0000 mL | INTRAVENOUS | Status: DC

## 2016-04-25 MED ORDER — ACETAMINOPHEN 650 MG RE SUPP: 650.0000 mg | RECTAL | Status: DC | PRN

## 2016-04-25 MED ORDER — BUPIVACAINE HCL (PF) 0.5 % IJ SOLN
INTRAMUSCULAR | Status: DC | PRN
  Administered 2016-04-25: 10 mL
  Administered 2016-04-25: 20 mL

## 2016-04-25 MED ORDER — LIDOCAINE 2% (20 MG/ML) 5 ML SYRINGE
INTRAMUSCULAR | Status: AC
  Filled 2016-04-25: qty 5

## 2016-04-25 MED ORDER — MORPHINE SULFATE (PF) 2 MG/ML IV SOLN
1.0000 mg | INTRAVENOUS | Status: DC | PRN
  Administered 2016-04-27: 2 mg via INTRAVENOUS
  Filled 2016-04-25: qty 1

## 2016-04-25 MED ORDER — ONDANSETRON HCL 4 MG/2ML IJ SOLN
INTRAMUSCULAR | Status: AC
  Filled 2016-04-25: qty 4

## 2016-04-25 SURGICAL SUPPLY — 68 items
ADH SKN CLS APL DERMABOND .7 (GAUZE/BANDAGES/DRESSINGS) ×1
BAG DECANTER FOR FLEXI CONT (MISCELLANEOUS) ×3 IMPLANT
BLADE CLIPPER SURG (BLADE) IMPLANT
BLADE SURG 15 STRL LF DISP TIS (BLADE) IMPLANT
BLADE SURG 15 STRL SS (BLADE) ×3
BUR MATCHSTICK NEURO 3.0 LAGG (BURR) ×3 IMPLANT
CAGE COROENT LRG 10X9X28-12 (Cage) ×4 IMPLANT
CAGE COROENT LRG MP 8X9X28-12 (Cage) ×8 IMPLANT
CANISTER SUCT 3000ML PPV (MISCELLANEOUS) ×3 IMPLANT
CONT SPEC 4OZ CLIKSEAL STRL BL (MISCELLANEOUS) ×3 IMPLANT
COVER BACK TABLE 60X90IN (DRAPES) ×3 IMPLANT
DECANTER SPIKE VIAL GLASS SM (MISCELLANEOUS) ×3 IMPLANT
DERMABOND ADVANCED (GAUZE/BANDAGES/DRESSINGS) ×2
DERMABOND ADVANCED .7 DNX12 (GAUZE/BANDAGES/DRESSINGS) ×1 IMPLANT
DEVICE DISSECT PLASMABLAD 3.0S (MISCELLANEOUS) IMPLANT
DRAPE C-ARM 42X72 X-RAY (DRAPES) ×6 IMPLANT
DRAPE LAPAROTOMY 100X72X124 (DRAPES) ×3 IMPLANT
DRAPE POUCH INSTRU U-SHP 10X18 (DRAPES) ×3 IMPLANT
DRAPE PROXIMA HALF (DRAPES) IMPLANT
DRSG OPSITE POSTOP 4X10 (GAUZE/BANDAGES/DRESSINGS) ×2 IMPLANT
DURAPREP 26ML APPLICATOR (WOUND CARE) ×3 IMPLANT
ELECT BLADE 4.0 EZ CLEAN MEGAD (MISCELLANEOUS) ×3
ELECT REM PT RETURN 9FT ADLT (ELECTROSURGICAL) ×3
ELECTRODE BLDE 4.0 EZ CLN MEGD (MISCELLANEOUS) IMPLANT
ELECTRODE REM PT RTRN 9FT ADLT (ELECTROSURGICAL) ×1 IMPLANT
GAUZE SPONGE 4X4 12PLY STRL (GAUZE/BANDAGES/DRESSINGS) ×3 IMPLANT
GAUZE SPONGE 4X4 16PLY XRAY LF (GAUZE/BANDAGES/DRESSINGS) ×2 IMPLANT
GLOVE BIOGEL PI IND STRL 8.5 (GLOVE) ×2 IMPLANT
GLOVE BIOGEL PI INDICATOR 8.5 (GLOVE) ×4
GLOVE ECLIPSE 8.5 STRL (GLOVE) ×6 IMPLANT
GLOVE EXAM NITRILE LRG STRL (GLOVE) IMPLANT
GLOVE EXAM NITRILE MD LF STRL (GLOVE) IMPLANT
GLOVE EXAM NITRILE XL STR (GLOVE) IMPLANT
GLOVE EXAM NITRILE XS STR PU (GLOVE) IMPLANT
GOWN STRL REUS W/ TWL LRG LVL3 (GOWN DISPOSABLE) IMPLANT
GOWN STRL REUS W/ TWL XL LVL3 (GOWN DISPOSABLE) IMPLANT
GOWN STRL REUS W/TWL 2XL LVL3 (GOWN DISPOSABLE) ×6 IMPLANT
GOWN STRL REUS W/TWL LRG LVL3 (GOWN DISPOSABLE)
GOWN STRL REUS W/TWL XL LVL3 (GOWN DISPOSABLE) ×3
HEMOSTAT POWDER KIT SURGIFOAM (HEMOSTASIS) ×6 IMPLANT
KIT BASIN OR (CUSTOM PROCEDURE TRAY) ×3 IMPLANT
KIT INFUSE MEDIUM (Orthopedic Implant) ×2 IMPLANT
KIT ROOM TURNOVER OR (KITS) ×3 IMPLANT
MODULE POWER NUVASIVE (MISCELLANEOUS) IMPLANT
NEEDLE HYPO 22GX1.5 SAFETY (NEEDLE) ×3 IMPLANT
NS IRRIG 1000ML POUR BTL (IV SOLUTION) ×3 IMPLANT
PACK LAMINECTOMY NEURO (CUSTOM PROCEDURE TRAY) ×3 IMPLANT
PAD ARMBOARD 7.5X6 YLW CONV (MISCELLANEOUS) ×9 IMPLANT
PATTIES SURGICAL .5 X1 (DISPOSABLE) ×3 IMPLANT
PATTIES SURGICAL 1X1 (DISPOSABLE) ×2 IMPLANT
PLASMABLADE 3.0S (MISCELLANEOUS) ×3
POWER MODULE NUVASIVE (MISCELLANEOUS) ×3
ROD RELINE-O LORD 5.5X110MM (Rod) ×4 IMPLANT
SCREW LOCK RELINE 5.5 TULIP (Screw) ×16 IMPLANT
SCREW RELINE-O POLY 6.5X50MM (Screw) ×16 IMPLANT
SPONGE LAP 4X18 X RAY DECT (DISPOSABLE) IMPLANT
SPONGE SURGIFOAM ABS GEL 100 (HEMOSTASIS) ×3 IMPLANT
SUT VIC AB 1 CT1 18XBRD ANBCTR (SUTURE) ×1 IMPLANT
SUT VIC AB 1 CT1 8-18 (SUTURE) ×3
SUT VIC AB 2-0 CP2 18 (SUTURE) ×5 IMPLANT
SUT VIC AB 3-0 SH 8-18 (SUTURE) ×5 IMPLANT
SYR 3ML LL SCALE MARK (SYRINGE) ×12 IMPLANT
TOWEL OR 17X24 6PK STRL BLUE (TOWEL DISPOSABLE) ×3 IMPLANT
TOWEL OR 17X26 10 PK STRL BLUE (TOWEL DISPOSABLE) ×3 IMPLANT
TRAP SPECIMEN MUCOUS 40CC (MISCELLANEOUS) ×3 IMPLANT
TRAY FOLEY METER SIL LF 16FR (CATHETERS) ×2 IMPLANT
TRAY FOLEY W/METER SILVER 14FR (SET/KITS/TRAYS/PACK) ×1 IMPLANT
WATER STERILE IRR 1000ML POUR (IV SOLUTION) ×3 IMPLANT

## 2016-04-25 NOTE — Anesthesia Procedure Notes (Signed)
Procedure Name: Intubation Date/Time: 04/25/2016 2:11 PM Performed by: Eligha Bridegroom Pre-anesthesia Checklist: Timeout performed, Patient identified, Emergency Drugs available, Suction available and Patient being monitored Patient Re-evaluated:Patient Re-evaluated prior to inductionOxygen Delivery Method: Circle system utilized Preoxygenation: Pre-oxygenation with 100% oxygen Intubation Type: IV induction Ventilation: Oral airway inserted - appropriate to patient size Laryngoscope Size: Mac and 4 Grade View: Grade I Tube type: Oral Number of attempts: 1 Airway Equipment and Method: Stylet Secured at: 23 cm Tube secured with: Tape Dental Injury: Teeth and Oropharynx as per pre-operative assessment

## 2016-04-25 NOTE — Anesthesia Postprocedure Evaluation (Signed)
Anesthesia Post Note  Patient: [name]  Procedure(s) Performed: Procedure(s) (LRB): LUMBAR TWO-THREE ,LUMBAR THREE-FOUR, LUMBAR FOUR-FIVE  Posterior lumbar interbody fusion (N/A)  Level of consciousness: awake    Last Vitals:  Filed Vitals:   04/25/16 0944  BP: 131/78  Pulse: 95  Temp: 36.9 C  Resp: 20    Last Pain: There were no vitals filed for this visit.               Justine Cossin

## 2016-04-25 NOTE — Anesthesia Preprocedure Evaluation (Signed)
Anesthesia Evaluation  Patient identified by MRN, date of birth, ID band Patient awake    Reviewed: Allergy & Precautions, NPO status   Airway Mallampati: II  TM Distance: >3 FB Neck ROM: Full    Dental   Pulmonary former smoker,    breath sounds clear to auscultation       Cardiovascular negative cardio ROS   Rhythm:Regular Rate:Normal     Neuro/Psych    GI/Hepatic negative GI ROS, Neg liver ROS,   Endo/Other  negative endocrine ROS  Renal/GU negative Renal ROS     Musculoskeletal   Abdominal   Peds  Hematology  (+) Blood dyscrasia, ,   Anesthesia Other Findings   Reproductive/Obstetrics                             Anesthesia Physical Anesthesia Plan  ASA: II  Anesthesia Plan: General   Post-op Pain Management:    Induction: Intravenous  Airway Management Planned: Oral ETT  Additional Equipment:   Intra-op Plan:   Post-operative Plan: Extubation in OR  Informed Consent: I have reviewed the patients History and Physical, chart, labs and discussed the procedure including the risks, benefits and alternatives for the proposed anesthesia with the patient or authorized representative who has indicated his/her understanding and acceptance.   Dental advisory given  Plan Discussed with: CRNA and Anesthesiologist  Anesthesia Plan Comments:         Anesthesia Quick Evaluation

## 2016-04-25 NOTE — Transfer of Care (Signed)
Immediate Anesthesia Transfer of Care Note  Patient: [name]  Procedure(s) Performed: Procedure(s): LUMBAR TWO-THREE ,LUMBAR THREE-FOUR, LUMBAR FOUR-FIVE  Posterior lumbar interbody fusion (N/A)  Patient Location: PACU  Anesthesia Type:MAC and General  Level of Consciousness: awake, alert  and oriented  Airway & Oxygen Therapy: Patient Spontanous Breathing and Patient connected to nasal cannula oxygen  Post-op Assessment: Report given to RN and Post -op Vital signs reviewed and stable  Post vital signs: Reviewed and stable  Last Vitals:  Filed Vitals:   04/25/16 0944  BP: 131/78  Pulse: 95  Temp: 36.9 C  Resp: 20    Last Pain: There were no vitals filed for this visit.    Patients Stated Pain Goal: 3 ([id number])  Complications: No apparent anesthesia complications

## 2016-04-25 NOTE — Op Note (Signed)
Date of surgery: 04/25/2016 PReoperative diagnosis: Lumbar spondylosis with radiculopathy L2-3 L3-4 L4-5 Postoperative diagnosis: Same Procedure: Laminectomy L2-L3 and L4 with decompression of L2 L3 L4 and L5 nerve roots with more work than required for simple posterior interbody technique. Posterior lumbar interbody technique L2-3 L3-4 and L4-5 posterior lateral arthrodesis with local autograft and allograft interbody arthrodesis using local autograft and allograft including infuse and peek spacers. Segmental pedicle screw fixation L2 L5 Surgeon: Kristeen Miss First assistant: Deri Fuelling M.D. Anesthesia: Gen. endotracheal Indications:, [name] is a 80 year old individual who's had significant back and bilateral lower extremity pain. He has advanced spondylitic changes with right-sided foraminal stenosis at L2-3 L3-4 and L4-5. After careful evaluation of his options we discussed surgical intervention decompress those levels L2-3 L3-4 and L4-5 and this is now being performed with posterior interbody arthrodesis technique.  Procedure: The patient was brought to the operating room supine on a stretcher. After the smooth induction of general endotracheal anesthesia, he was turned prone. The back was prepped with alcohol and DuraPrep and draped in a sterile fashion. A midline incision was created and carried down to the lumbar dorsal fascia which was opened on either side of midline to expose the spinous processes of L2 L3 L4 and L5 which were identified positively with radiography. Subperiosteal dissection was carried out to the facet joints which were noted to be markedly hypertrophied and disfigured. Then once the soft tissues were isolated laminectomy of L2 removing the inferior marginal lamina of L2 out to and including the entirety of the facet joint at the L2-3 level was performed on either side. The entire laminar arch of L3 and L4 were completely removed. L5 was then undercut the oh ligament was taken  up the dissection was carried out into the lateral gutters where particularly on the right side there was noted be severe stenosis for the L4 nerve root and the L3 nerve root his were each decompressed individually using a high-speed drill to millimeter in a 3 mm Kerrison punch. Care was taken to protect the nerve roots and their travel out the foramen. The decompression was performed bilaterally though the left side was not nearly as stenotic or was significant redundant yellow ligament in the region of the foramen. Once the decompression was completed then the discs were opened and evacuated of all the contents starting at L4-L5 and the endplates were curettaged smooth and once all the disc was removed and interbody spreader was used to reestablish some lordosis and at the L4-L5 level 10 mm tall 12 lordotic 20 mm long cages were used to reestablish the disc space height at total of 12 mL of bone was packed into the interspace along with the cages. At L3-4-1 similar process was carried out however here on he 8 mm tall 12 lordotic 20 mm long cages could be placed into the interspace along with 6 mL of bone graft. At L2-3 a similar process was carried out in the same type of bone graft mixture of 6 mL with the same size cage was placed. Once the cages were all placed then the transverse processes were exposed and decorticated for later use and bone grafting. The bones of the laminar arch were used as the autograft. Should be noted that a bit of infuse was placed into each interspace to help with of the fusion process along with the peek spacers and autograft. Pedicle entry sites were then chosen at L2 L3 L4 and L5 and 6.5 x 50 mm screws were  used throughout this construct with each individual hole being first sounded then tapped and sounded again to make sure there was no cut out and then having the screw placed under fluoroscopic visualization. Once all the screws were placed in 110 mm precontoured rod was placed  between the screw heads and ability lordosis was dialed into the construct. The screws were tightened into final position. Final radiographs were obtained in AP and lateral projection. Then individually I made sure that each of the nerve roots L2 L3 L4 and L5 were well decompressed in a travel out the foramen and good hemostasis was achieved. Lateral gutters had been packed with the remainder of the autologous bone graft in addition to strips of infuse. With this being completed the lumbar dorsal fascia was reapproximated and closed with #1 Vicryl interrupted fashion 2-0 Vicryl was used in the subcutaneous anus tissues and 3-0 Vicryl subcuticularly. Blood loss for the procedure was estimated at 900 mL and a total of 410 mL of Cell Saver blood was returned to the patient.
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
## 2016-04-26 LAB — BASIC METABOLIC PANEL
Anion gap: 11 (ref 5–15)
BUN: 29 mg/dL — AB (ref 6–20)
CHLORIDE: 107 mmol/L (ref 101–111)
CO2: 21 mmol/L — ABNORMAL LOW (ref 22–32)
Calcium: 7.9 mg/dL — ABNORMAL LOW (ref 8.9–10.3)
Creatinine, Ser: 1.98 mg/dL — ABNORMAL HIGH (ref 0.61–1.24)
GFR calc Af Amer: 36 mL/min — ABNORMAL LOW (ref >60)
GFR calc non Af Amer: 31 mL/min — ABNORMAL LOW (ref >60)
GLUCOSE: 134 mg/dL — AB (ref 65–99)
POTASSIUM: 4.8 mmol/L (ref 3.5–5.1)
Sodium: 139 mmol/L (ref 135–145)

## 2016-04-26 LAB — CBC
HCT: 35.5 % — ABNORMAL LOW (ref 39.0–52.0)
HEMOGLOBIN: 11.4 g/dL — AB (ref 13.0–17.0)
MCH: 29.9 pg (ref 26.0–34.0)
MCHC: 32.1 g/dL (ref 30.0–36.0)
MCV: 93.2 fL (ref 78.0–100.0)
PLATELETS: 166 10*3/uL (ref 150–400)
RBC: 3.81 MIL/uL — AB (ref 4.22–5.81)
RDW: 14.4 % (ref 11.5–15.5)
WBC: 19.1 10*3/uL — ABNORMAL HIGH (ref 4.0–10.5)

## 2016-04-26 MED ORDER — SODIUM CHLORIDE 0.9 % IV BOLUS (SEPSIS): 500.0000 mL | Freq: Once | INTRAVENOUS | Status: DC

## 2016-04-26 MED ORDER — ALUM & MAG HYDROXIDE-SIMETH 200-200-20 MG/5ML PO SUSP
30.0000 mL | ORAL | Status: DC | PRN
  Administered 2016-04-26: 30 mL via ORAL
  Filled 2016-04-26: qty 30

## 2016-04-26 MED ORDER — CHLORPROMAZINE HCL 10 MG PO TABS
10.0000 mg | ORAL_TABLET | Freq: Once | ORAL | Status: AC
Start: 2016-04-26 — End: 2016-04-26
  Administered 2016-04-26: 10 mg via ORAL
  Filled 2016-04-26: qty 1

## 2016-04-26 MED FILL — Sodium Chloride Irrigation Soln 0.9%: Qty: 3000 | Status: AC

## 2016-04-26 MED FILL — Sodium Chloride IV Soln 0.9%: INTRAVENOUS | Qty: 1000 | Status: AC

## 2016-04-26 MED FILL — Heparin Sodium (Porcine) Inj 1000 Unit/ML: INTRAMUSCULAR | Qty: 30 | Status: AC

## 2016-04-26 NOTE — Progress Notes (Signed)
2015 spoke with Dr. Christella Noa regarding patient BP low, urine output low, and HR elevated. Pt not symptomatic. Orders given for 500 cc bolus of NS and to increase maintenance fluids to 125 ml/hr. Will continue to monitor.

## 2016-04-26 NOTE — Progress Notes (Signed)
Utilization review completed.  

## 2016-04-26 NOTE — Progress Notes (Signed)
[phone] paged neurosurgeon on call to notify of low urine output. Orders given for 500 cc NS bolus. Will continue to monitor urine output.
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
## 2016-04-27 DIAGNOSIS — M4726 Other spondylosis with radiculopathy, lumbar region: Principal | ICD-10-CM

## 2016-04-27 DIAGNOSIS — R2689 Other abnormalities of gait and mobility: Secondary | ICD-10-CM

## 2016-04-27 MED ORDER — DEXAMETHASONE 2 MG PO TABS
2.0000 mg | ORAL_TABLET | Freq: Two times a day (BID) | ORAL | Status: DC
  Administered 2016-04-27 – 2016-05-02 (×11): 2 mg via ORAL
  Filled 2016-04-27 (×11): qty 1

## 2016-04-27 NOTE — Progress Notes (Signed)
Patient ID: [name], male   DOB: 10-17-1936, 80 y.o.   MRN: [id number] Vital signs are stable Motor function is intact in lower extremities the left knee seems to buckle Dressing is clean and dry Urine output has picked up considerably May be a good candidate for CI R Rehabilitation consult placed Foley to come out today

## 2016-04-27 NOTE — Progress Notes (Signed)
Inpatient Rehabilitation  Per therapy request patient was screened by Gunnar Fusi for appropriateness for an Inpatient Acute Rehab consult.  Note that an order for consult has already been placed.  Admission coordinator to follow up after MD completes consult.      Carmelia Roller., CCC/SLP Admission Coordinator  [place]  Cell [phone]
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
## 2016-04-27 NOTE — Anesthesia Postprocedure Evaluation (Signed)
Anesthesia Post Note  Patient: [name]  Procedure(s) Performed: Procedure(s) (LRB): LUMBAR TWO-THREE ,LUMBAR THREE-FOUR, LUMBAR FOUR-FIVE  Posterior lumbar interbody fusion (N/A)  Patient location during evaluation: PACU Anesthesia Type: General Level of consciousness: awake Pain management: pain level controlled Vital Signs Assessment: post-procedure vital signs reviewed and stable Respiratory status: spontaneous breathing Cardiovascular status: stable Postop Assessment: no signs of nausea or vomiting Anesthetic complications: no    Last Vitals:  Filed Vitals:   04/27/16 0700 04/27/16 0800  BP: 109/56 119/92  Pulse: 86 93  Temp:  36.7 C  Resp: 11 17    Last Pain:  Filed Vitals:   04/27/16 0847  PainSc: 6                  Monisha Siebel
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
## 2016-04-28 MED ORDER — DIPHENHYDRAMINE HCL 25 MG PO CAPS: 25.0000 mg | ORAL_CAPSULE | Freq: Four times a day (QID) | ORAL | Status: DC | PRN

## 2016-04-28 NOTE — Discharge Summary (Addendum)
Physician Discharge Summary  Patient ID: [name] MRN: [id number] DOB/AGE: [birth year] 80 y.o.  Admit date: 04/25/2016 Discharge date: 05/02/2016  Admission Diagnoses:Lumbar spondylosis with radiculopathy L2-3 L3-4 L4-5  Discharge Diagnoses: Lumbar spondylosis with radiculopathy L2-3 L3-4 L4-5 Active Problems:   Strain of back   Spondylarthrosis   Discharged Condition: fair  Hospital Course: Patient is a 80 year old individual who underwent three-level decompression arthrodesis L2-3 L3-4 and L4-5. He tolerated surgery well however he did have some hemodynamic instability with low blood pressures for a couple of days necessitating a stay in the intensive care unit. He responded well to fluids. He is ready for rehabilitation at this time.  Consults: None  Significant Diagnostic Studies: None  Treatments: surgery: Posterior decompression L23 L3-4 L4-5 with posterior lumbar interbody arthrodesis L2-3 L3-4 L4-5 posterior lateral arthrodesis L2-L5 with segmental pedicle screw fixation from L2-L5.  Discharge Exam: Blood pressure 118/62, pulse 113, temperature 98.3 F (36.8 C), temperature source Oral, resp. rate 19, height 6\' 2"  (1.88 m), weight 117.5 kg (259 lb 0.7 oz), SpO2 97 %. Incisions clean and dry motor function is intact  Disposition: Skilled nursing facility. [name]  Discharge Instructions    Call MD for:  redness, tenderness, or signs of infection (pain, swelling, redness, odor or green/yellow discharge around incision site)    Complete by:  As directed      Call MD for:  severe uncontrolled pain    Complete by:  As directed      Call MD for:  temperature >100.4    Complete by:  As directed      Diet - low sodium heart healthy    Complete by:  As directed      Discharge instructions    Complete by:  As directed   Okay to shower. Do not apply salves or appointments to incision. No heavy lifting with the upper extremities greater than 15 pounds. May resume driving  when not requiring pain medication and patient feels comfortable with doing so.     Increase activity slowly    Complete by:  As directed             Medication List    STOP taking these medications        GLUCOSAMINE COMPLEX PO      TAKE these medications        acetaminophen 325 MG tablet  Commonly known as:  TYLENOL  Take 650 mg by mouth every 6 (six) hours as needed for moderate pain.     aspirin EC 81 MG tablet  Take 81 mg by mouth daily.     beta carotene 25000 UNIT capsule  Take 25,000 Units by mouth daily.     EPA 1000 MG Caps  Take 1,000 mg by mouth daily.     Garlic [id number] MG Caps  Take 500 mg by mouth daily.     methocarbamol 500 MG tablet  Commonly known as:  ROBAXIN  Take 125 mg by mouth every 8 (eight) hours as needed for muscle spasms.     multivitamin with minerals Tabs tablet  Take 1 tablet by mouth daily.     oxyCODONE 5 MG immediate release tablet  Commonly known as:  Oxy IR/ROXICODONE  Take 5 mg by mouth 3 (three) times daily.     oxyCODONE-acetaminophen 5-325 MG tablet  Commonly known as:  PERCOCET/ROXICET  Take 1 tablet by mouth every 4 (four) hours as needed for severe pain.     simvastatin 40 MG  tablet  Commonly known as:  ZOCOR  Take 40 mg by mouth daily.     Soya Lecithin 1200 MG Caps  Take 1,200 mg by mouth daily.     vitamin C 500 MG tablet  Commonly known as:  ASCORBIC ACID  Take 500 mg by mouth daily.     vitamin E 400 UNIT capsule  Take 400 Units by mouth daily.         SignedEarleen Newport 04/28/2016, 4:04 PM
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
## 2016-04-28 NOTE — Progress Notes (Signed)
Inpatient Rehabilitation  Met with patient and spouse to discuss team's recommendation for IP Rehab post acute care stay.  Review program and provided booklets as well as updated regarding insurance process.  We currently await insurance authorization.  Please call with questions.  Carmelia Roller., CCC/SLP Admission Coordinator  [place]  Cell [phone]

## 2016-04-28 NOTE — Care Management Note (Signed)
Case Management Note  Patient Details  Name: Jasmine Nakayama MRN: [id number] Date of Birth: [birth year]  Subjective/Objective:   Pt admitted on 04/25/16 s/p laminectomy with decompression on 04/25/16.  PTA, pt independent, lives with spouse.  PT/OT recommending CIR prior to dc home.                Action/Plan: Per inpatient rehab liaison, insurance has denied admission to CIR.  Rehab MD has done Peer to Peer review with Market researcher for insurance company; await final decision.    Expected Discharge Date:                  Expected Discharge Plan:  IP Rehab Facility  In-House Referral:  Clinical Social Work  Discharge planning Services  CM Consult  Post Acute Care Choice:    Choice offered to:     DME Arranged:    DME Agency:     HH Arranged:    [place] Agency:     Status of Service:  In process, will continue to follow  Medicare Important Message Given:    Date Medicare IM Given:    Medicare IM give by:    Date Additional Medicare IM Given:    Additional Medicare Important Message give by:     If discussed at [place] of Stay Meetings, dates discussed:    Additional Comments:  Reinaldo Raddle, RN, BSN  Trauma/Neuro ICU Case Manager [phone]

## 2016-04-28 NOTE — Progress Notes (Signed)
Patient ID: [name], male   DOB: [birth year], 80 y.o.   MRN: [id number] Patient's vital signs have remained stable since near syncopal episode yesterday Critical care evaluation did not occur Patient was out of bed today without recurrent episode Plan to transfer to the floor tonight Rehabilitation medicine feels patient is appropriate for CIR Insurance clearance is pending Will write tentative discharge pending rehabilitation transfer/admission

## 2016-04-29 ENCOUNTER — Inpatient Hospital Stay (HOSPITAL_COMMUNITY): Payer: Medicare Other

## 2016-04-29 DIAGNOSIS — M79605 Pain in left leg: Secondary | ICD-10-CM

## 2016-04-29 NOTE — Progress Notes (Signed)
PT has history of cellulitis in left leg in January 2017.   Upon standing and pivoting back to bed from chair, pt was experiencing 10/10 pain in the upper left leg.  Dark pink / purple discoloration was also noted in pt's lower left leg.   Dr. Cyndy Freeze was called and notified of the change in the pt and ultrasound of the left leg was ordered.   Vascular tech on call was also notified.  Awaiting ultrasound. Sherril Cong, RN

## 2016-04-29 NOTE — Progress Notes (Signed)
No acute events Patient reports he is pain free AVSS Moves legs well Incision looks good Stable Transfer to floor as soon as bed is available Continue therapy Possible rehab admission over the weekend or next week

## 2016-04-29 NOTE — Progress Notes (Signed)
VASCULAR LAB PRELIMINARY  PRELIMINARY  PRELIMINARY  PRELIMINARY  Left lower extremity venous duplex completed.    Preliminary report:  There is no DVT or SVT noted in the left lower extremity.   Hinda Lindor, RVT 04/29/2016, 4:44 PM
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
## 2016-04-30 NOTE — Progress Notes (Signed)
No acute events Doing well AVSS Moving legs well Baseline swelling and erythema in left leg per patient Stable Therapy
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
## 2016-05-01 NOTE — Progress Notes (Signed)
Patient ID: [name], male   DOB: 01-01-1936, 80 y.o.   MRN: [id number] I will signs are stable Patient and family were hopeful for inpatient rehabilitation at [facility] This did not materialize because of insurance concerns Daughter has secured a bed at [place] facility for tomorrow Patient will be discharged in a.m. to [name].
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
## 2016-05-01 NOTE — Clinical Social Work Note (Signed)
Clinical Social Work Assessment  Patient Details  Name: [name] MRN: [id number] Date of Birth: December 26, 1935  Date of referral:  05/01/16               Reason for consult:  Facility Placement                Permission sought to share information with:  Facility Sport and exercise psychologist, Family Supports Permission granted to share information::  Yes, Verbal Permission Granted  Name::     [name]  Agency::  [facility] SNFs  Relationship::  Spouse  Contact Information:  [phone]  Housing/Transportation Living arrangements for the past 2 months:  [place] of Information:  Patient Patient Interpreter Needed:  None Criminal Activity/Legal Involvement Pertinent to Current Situation/Hospitalization:  No - Comment as needed Significant Relationships:  Adult Children, Spouse Lives with:  Spouse Do you feel safe going back to the place where you live?  No Need for family participation in patient care:  Yes (Comment)  Care giving concerns:  CSW received referral for possible SNF placement at time of discharge. CSW met with patient regarding PT recommendation of SNF placement at time of discharge if CIR is unable to admit. Patient reported that  patient's wife is currently unable to care for patient at their home given patient's current physical needs and fall risk. Patient expressed understanding of PT recommendation and are agreeable to SNF placement at time of discharge. CSW to continue to follow and assist with discharge planning needs.   Social Worker assessment / plan:  CSW spoke with patient concerning possibility of rehab at [facility] before returning home if CIR is unable to admit.  Employment status:  Retired Nurse, adult PT Recommendations:  [place] / Referral to community resources:  [place]  Patient/Family's Response to care:  Patient recognizes need for rehab before returning home and is  agreeable to a SNF in [place]. Patient reported preference for [facility]. Patient is hopeful that he can be admitted to CIR.  Patient/Family's Understanding of and Emotional Response to Diagnosis, Current Treatment, and Prognosis:  Patient is realistic regarding therapy needs. No questions/concerns about plan or treatment.    Emotional Assessment Appearance:  Appears stated age Attitude/Demeanor/Rapport:  Other (Pleasant) Affect (typically observed):  Accepting, Pleasant, Appropriate Orientation:  Oriented to Situation, Oriented to  Time, Oriented to Place, Oriented to Self Alcohol / Substance use:  Not Applicable Psych involvement (Current and /or in the community):  No (Comment)  Discharge Needs  Concerns to be addressed:  Care Coordination Readmission within the last 30 days:  No Current discharge risk:  None Barriers to Discharge:  No Barriers Identified   Benard Halsted, [place] 05/01/2016, 9:24 AM

## 2016-05-01 NOTE — Progress Notes (Signed)
Inpatient Rehabilitation  I have received verbal notification from [facility] on appeals process for pt's family.  [employer] rep states a faxed letter of the appeals process is to follow.  I spoke with pt. and his wife to provide the Expedited Appeals phone number of [phone] and reference # [id number].  I have updated Jacqualin Combes [facility] with this information.  She indicated she will follow up with the patient.  Wife states pt's daughter has secured pt. A bed at [facility] as a back up.  Please call if questions.  [place] Admissions Coordinator Cell [phone] Office [phone]

## 2016-05-01 NOTE — NC FL2 (Signed)
[place] LEVEL OF CARE SCREENING TOOL     IDENTIFICATION  Patient Name: [name] Birthdate: [birth year] Sex: male Admission Date (Current Location): 04/25/2016  [facility] and [place] Number:  Herbalist and Address:  [facility] [place]. [facility], [place] [address] [place], [place] [address]      Provider Number: [id number]  Attending Physician Name and Address:  Kristeen Miss, MD  Relative Name and Phone Number:  [name] spouse, [phone]    Current Level of Care: Hospital Recommended Level of Care: [place] Prior Approval Number:    Date Approved/Denied:   PASRR Number: [id number]  Discharge Plan: SNF    Current Diagnoses: Patient Active Problem List   Diagnosis Date Noted  . Strain of back 04/25/2016  . Spondylarthrosis 04/25/2016    Orientation RESPIRATION BLADDER Height & Weight     Self, Time, Situation, Place  Normal Continent Weight: 117.482 kg (259 lb) Height:  6\' 2"  (188 cm)  BEHAVIORAL SYMPTOMS/MOOD NEUROLOGICAL BOWEL NUTRITION STATUS      Continent Diet (Please see DC summary)  AMBULATORY STATUS COMMUNICATION OF NEEDS Skin   Extensive Assist Verbally Surgical wounds (Closed incision on back)                       Personal Care Assistance Level of Assistance  Bathing, Feeding, Dressing Bathing Assistance: Maximum assistance Feeding assistance: Limited assistance Dressing Assistance: Limited assistance     Functional Limitations Info  Hearing   Hearing Info: Impaired      SPECIAL CARE FACTORS FREQUENCY  PT (By licensed PT)     PT Frequency: min 5x/week              Contractures      Additional Factors Info  Code Status, Allergies Code Status Info: Not on File Allergies Info: NKA           Current Medications (05/01/2016):  This is the current hospital active medication list Current Facility-Administered Medications  Medication Dose Route Frequency Provider Last Rate Last  Dose  . acetaminophen (TYLENOL) tablet 650 mg  650 mg Oral Q4H PRN Kristeen Miss, MD       Or  . acetaminophen (TYLENOL) suppository 650 mg  650 mg Rectal Q4H PRN Kristeen Miss, MD      . alum & mag hydroxide-simeth (MAALOX/MYLANTA) 200-200-20 MG/5ML suspension 30 mL  30 mL Oral Q4H PRN Kristeen Miss, MD   30 mL at 04/26/16 1149  . bisacodyl (DULCOLAX) suppository 10 mg  10 mg Rectal Daily PRN Kristeen Miss, MD      . dexamethasone (DECADRON) tablet 2 mg  2 mg Oral Q12H Kristeen Miss, MD   2 mg at 04/30/16 2129  . diphenhydrAMINE (BENADRYL) capsule 25 mg  25 mg Oral Q6H PRN Kary Kos, MD      . docusate sodium (COLACE) capsule 100 mg  100 mg Oral BID Kristeen Miss, MD   100 mg at 04/30/16 0914  . menthol-cetylpyridinium (CEPACOL) lozenge 3 mg  1 lozenge Oral PRN Kristeen Miss, MD       Or  . phenol (CHLORASEPTIC) mouth spray 1 spray  1 spray Mouth/Throat PRN Kristeen Miss, MD      . methocarbamol (ROBAXIN) tablet 500 mg  500 mg Oral Q6H PRN Kristeen Miss, MD   500 mg at 04/26/16 1504   Or  . methocarbamol (ROBAXIN) 500 mg in dextrose 5 % 50 mL IVPB  500 mg Intravenous Q6H PRN Mallie Mussel  Elsner, MD      . morphine 2 MG/ML injection 1-4 mg  1-4 mg Intravenous Q3H PRN Kristeen Miss, MD   2 mg at 04/27/16 2345  . ondansetron (ZOFRAN) injection 4 mg  4 mg Intravenous Q4H PRN Kristeen Miss, MD      . oxyCODONE-acetaminophen (PERCOCET/ROXICET) 5-325 MG per tablet 1-2 tablet  1-2 tablet Oral Q4H PRN Kristeen Miss, MD   2 tablet at 04/30/16 2129  . polyethylene glycol (MIRALAX / GLYCOLAX) packet 17 g  17 g Oral Daily PRN Kristeen Miss, MD      . [facility]) tablet 8.6 mg  1 tablet Oral BID Kristeen Miss, MD   8.6 mg at 04/30/16 0914  . simvastatin (ZOCOR) tablet 40 mg  40 mg Oral Daily Kristeen Miss, MD   40 mg at 04/30/16 0914  . sodium chloride 0.9 % bolus 500 mL  500 mL Intravenous Once Earnie Larsson, MD      . sodium chloride 0.9 % bolus 500 mL  500 mL Intravenous Once Ashok Pall, MD      . sodium phosphate (FLEET)  7-19 GM/118ML enema 1 enema  1 enema Rectal Once PRN Kristeen Miss, MD         Discharge Medications: Please see discharge summary for a list of discharge medications.  Relevant Imaging Results:  Relevant Lab Results:   Additional Information SSN: [place], [place]

## 2016-05-02 NOTE — Progress Notes (Signed)
Discharge orders received, Pt for discharge to [facility]. IV d/c'd. D/c instructions given with verbalized understanding. Family at bedside to assist patient with discharge. Staff bought pt downstairs via wheelchair.05/02/16

## 2016-05-02 NOTE — Progress Notes (Signed)
Patient will discharge to [facility] SNF Anticipated discharge date: 5/16 Family notified: wife at bedside Transportation by family- RN to release when ready Report #: [phone]  [place] signing off.  Domenica Reamer, [place] Social Worker [phone]

## 2016-05-02 NOTE — Care Management Important Message (Signed)
Important Message  Patient Details  Name: [name] MRN: [id number] Date of Birth: 21-Jul-1936   Medicare Important Message Given:  Yes    Rolm Baptise, RN 05/02/2016, 10:37 AM

## 2016-05-02 NOTE — Clinical Social Work Placement (Signed)
   CLINICAL SOCIAL WORK PLACEMENT  NOTE  Date:  05/02/2016  Patient Details  Name: [name] MRN: [id number] Date of Birth: 1936/01/18  Clinical Social Work is seeking post-discharge placement for this patient at the [place] level of care (*CSW will initial, date and re-position this form in  chart as items are completed):  Yes   Patient/family provided with [place] Work Department's list of facilities offering this level of care within the geographic area requested by the patient (or if unable, by the patient's family).  Yes   Patient/family informed of their freedom to choose among providers that offer the needed level of care, that participate in Medicare, Medicaid or managed care program needed by the patient, have an available bed and are willing to accept the patient.  Yes   Patient/family informed of [place]'s ownership interest in [facility] and [facility], as well as of the fact that they are under no obligation to receive care at these facilities.  PASRR submitted to EDS on 05/01/16     PASRR number received on 05/01/16     Existing PASRR number confirmed on       FL2 transmitted to all facilities in geographic area requested by pt/family on 05/01/16     FL2 transmitted to all facilities within larger geographic area on       Patient informed that his/her managed care company has contracts with or will negotiate with certain facilities, including the following:        Yes   Patient/family informed of bed offers received.  Patient chooses bed at [facility] recommends and patient chooses bed at      Patient to be transferred to [facility] [place] on 05/02/16.  Patient to be transferred to facility by family     Patient family notified on 05/02/16 of transfer.  Name of family member notified:  wife at bedside     PHYSICIAN Please sign FL2     Additional Comment:     _______________________________________________ Cranford Mon, LCSW 05/02/2016, 10:16 AM

## 2016-06-08 ENCOUNTER — Ambulatory Visit: Payer: Medicare Other | Attending: Neurological Surgery | Admitting: Physical Therapy

## 2016-06-08 DIAGNOSIS — R531 Weakness: Secondary | ICD-10-CM | POA: Diagnosis present

## 2016-06-08 DIAGNOSIS — R262 Difficulty in walking, not elsewhere classified: Secondary | ICD-10-CM | POA: Insufficient documentation

## 2016-06-08 DIAGNOSIS — M6281 Muscle weakness (generalized): Secondary | ICD-10-CM | POA: Diagnosis present

## 2016-06-08 DIAGNOSIS — R2689 Other abnormalities of gait and mobility: Secondary | ICD-10-CM | POA: Diagnosis present

## 2016-06-08 DIAGNOSIS — M545 Low back pain, unspecified: Secondary | ICD-10-CM
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
## 2016-06-09 ENCOUNTER — Ambulatory Visit: Payer: Medicare Other

## 2016-06-09 DIAGNOSIS — M545 Low back pain, unspecified: Secondary | ICD-10-CM

## 2016-06-09 DIAGNOSIS — R2689 Other abnormalities of gait and mobility: Secondary | ICD-10-CM

## 2016-06-09 DIAGNOSIS — M6281 Muscle weakness (generalized): Secondary | ICD-10-CM

## 2016-06-09 DIAGNOSIS — R262 Difficulty in walking, not elsewhere classified: Secondary | ICD-10-CM
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
## 2016-06-12 ENCOUNTER — Ambulatory Visit: Payer: Medicare Other

## 2016-06-12 DIAGNOSIS — M545 Low back pain, unspecified: Secondary | ICD-10-CM

## 2016-06-12 DIAGNOSIS — R262 Difficulty in walking, not elsewhere classified: Secondary | ICD-10-CM

## 2016-06-12 DIAGNOSIS — M6281 Muscle weakness (generalized): Secondary | ICD-10-CM

## 2016-06-12 DIAGNOSIS — R2689 Other abnormalities of gait and mobility: Secondary | ICD-10-CM
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
## 2016-06-14 ENCOUNTER — Encounter: Payer: Self-pay | Admitting: Physical Therapy

## 2016-06-14 ENCOUNTER — Ambulatory Visit: Payer: Medicare Other | Admitting: Physical Therapy

## 2016-06-14 DIAGNOSIS — R2689 Other abnormalities of gait and mobility: Secondary | ICD-10-CM

## 2016-06-14 DIAGNOSIS — R262 Difficulty in walking, not elsewhere classified: Secondary | ICD-10-CM

## 2016-06-14 DIAGNOSIS — M545 Low back pain, unspecified: Secondary | ICD-10-CM

## 2016-06-14 DIAGNOSIS — R531 Weakness: Secondary | ICD-10-CM

## 2016-06-14 DIAGNOSIS — M6281 Muscle weakness (generalized): Secondary | ICD-10-CM
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
## 2016-06-15 ENCOUNTER — Ambulatory Visit: Payer: Medicare Other | Admitting: Rehabilitative and Restorative Service Providers"

## 2016-06-15 ENCOUNTER — Encounter: Payer: Self-pay | Admitting: Rehabilitative and Restorative Service Providers"

## 2016-06-15 DIAGNOSIS — M545 Low back pain, unspecified: Secondary | ICD-10-CM

## 2016-06-15 DIAGNOSIS — R2689 Other abnormalities of gait and mobility: Secondary | ICD-10-CM

## 2016-06-15 DIAGNOSIS — R531 Weakness: Secondary | ICD-10-CM

## 2016-06-15 DIAGNOSIS — R262 Difficulty in walking, not elsewhere classified: Secondary | ICD-10-CM

## 2016-06-15 DIAGNOSIS — M6281 Muscle weakness (generalized): Secondary | ICD-10-CM
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
## 2016-06-19 ENCOUNTER — Ambulatory Visit: Payer: Medicare Other | Attending: Neurological Surgery | Admitting: Physical Therapy

## 2016-06-19 ENCOUNTER — Encounter: Payer: Self-pay | Admitting: Physical Therapy

## 2016-06-19 DIAGNOSIS — M6281 Muscle weakness (generalized): Secondary | ICD-10-CM | POA: Diagnosis present

## 2016-06-19 DIAGNOSIS — R2689 Other abnormalities of gait and mobility: Secondary | ICD-10-CM

## 2016-06-19 DIAGNOSIS — M545 Low back pain, unspecified: Secondary | ICD-10-CM

## 2016-06-19 DIAGNOSIS — R531 Weakness: Secondary | ICD-10-CM | POA: Insufficient documentation

## 2016-06-19 DIAGNOSIS — R262 Difficulty in walking, not elsewhere classified: Secondary | ICD-10-CM | POA: Insufficient documentation
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
## 2016-06-21 ENCOUNTER — Encounter: Payer: Self-pay | Admitting: Physical Therapy

## 2016-06-21 ENCOUNTER — Ambulatory Visit: Payer: Medicare Other | Admitting: Physical Therapy

## 2016-06-21 DIAGNOSIS — M6281 Muscle weakness (generalized): Secondary | ICD-10-CM

## 2016-06-21 DIAGNOSIS — M545 Low back pain, unspecified: Secondary | ICD-10-CM

## 2016-06-21 DIAGNOSIS — R262 Difficulty in walking, not elsewhere classified: Secondary | ICD-10-CM

## 2016-06-21 DIAGNOSIS — R531 Weakness: Secondary | ICD-10-CM

## 2016-06-21 DIAGNOSIS — R2689 Other abnormalities of gait and mobility: Secondary | ICD-10-CM

## 2016-06-21 NOTE — Patient Instructions (Signed)
Lower abdominal/core stability exercises  1. Practice your breathing technique: Inhale through your nose expanding your belly and rib cage. Try not to breathe into your chest. Exhale slowly and gradually out your mouth feeling a sense of softness to your body. Practice multiple times. This can be performed unlimited.  2. Finding the lower abdominals. Laying on your back with the knees bent, place your fingers just below your belly button. Using your breathing technique from above, on your exhale gently pull the belly button away from your fingertips without tensing any other muscles. Practice this 5x. Next, as you exhale, draw belly button inwards and hold onto it...then feel as if you are pulling that muscle across your pelvis like you are tightening a belt. This can be hard to do at first so be patient and practice. Do 5-10 reps 1-3 x day. Always recognize quality over quantity; if your abdominal muscles become tired you will notice you may tighten/contract other muscles. This is the time to take a break.   Practice this first laying on your back, then in sitting, progressing to standing and finally adding it to all your daily movements.   3. Finding your pelvic floor. Using the breathing technique above, when your exhale, this time draw your pelvic floor muscles up as if you were attempting to stop the flow of urination. Be careful NOT to tense any other muscles. This can be hard, BE PATIENT. Try to hold up to 10 seconds repeating 10x. Try 2x a day. Once you feel you are doing this well, add this contraction to exercise #2. First contracting your pelvic floor followed by lower abdominals.  4. Adding leg movements. Add the following leg movements to challenge your ability to keep your core stable:  1. Single leg drop outs: Laying on your back with knees bent feet flat. Inhale,  dropping one knee outward KEEPING YOUR PELVIS STILL. Exhale as you bring the leg back, simultaneously performing your lower  abdominal contraction. Do 5-10 on each leg.  2. Marching: While keeping your pelvis still, lift the right foot a few inches, put it down then lift left foot. This will mimic a march. Start slow to establish control. Once you have control you may speed it up. Do 10-20x. You MUST keep your lower abdominlas contracted while you march. Breathe naturally   3. Single leg slides: Inhale while you slowly slide one leg out keeping your pelvis still. Only slide your leg as far as you can keep your pelvis still. Exhale as you bring the leg back to the start, contracting the lower abdominals as you do that. Keep your upper body relaxed. Do 5-10 on each side. Lower abdominal/core stability exercises  1. Practice your breathing technique: Inhale through your nose expanding your belly and rib cage. Try not to breathe into your chest. Exhale slowly and gradually out your mouth feeling a sense of softness to your body. Practice multiple times. This can be performed unlimited.  2. Finding the lower abdominals. Laying on your back with the knees bent, place your fingers just below your belly button. Using your breathing technique from above, on your exhale gently pull the belly button away from your fingertips without tensing any other muscles. Practice this 5x. Next, as you exhale, draw belly button inwards and hold onto it...then feel as if you are pulling that muscle across your pelvis like you are tightening a belt. This can be hard to do at first so be patient and practice. Do 5-10   reps 1-3 x day. Always recognize quality over quantity; if your abdominal muscles become tired you will notice you may tighten/contract other muscles. This is the time to take a break.   Practice this first laying on your back, then in sitting, progressing to standing and finally adding it to all your daily movements.   3. Finding your pelvic floor. Using the breathing technique above, when your exhale, this time draw your pelvic floor  muscles up as if you were attempting to stop the flow of urination. Be careful NOT to tense any other muscles. This can be hard, BE PATIENT. Try to hold up to 10 seconds repeating 10x. Try 2x a day. Once you feel you are doing this well, add this contraction to exercise #2. First contracting your pelvic floor followed by lower abdominals.   4. Adding leg movements. Add the following leg movements to challenge your ability to keep your core stable:  1. Single leg drop outs: Laying on your back with knees bent feet flat. Inhale,  dropping one knee outward KEEPING YOUR PELVIS STILL. Exhale as you bring the leg back, simultaneously performing your lower abdominal contraction. Do 5-10 on each leg.   2. Marching: While keeping your pelvis still, lift the right foot a few inches, put it down then lift left foot. This will mimic a march. Start slow to establish control. Once you have control you may speed it up. Do 10-20x. You MUST keep your lower abdominlas contracted while you march. Breathe naturally    3. Single leg slides: Inhale while you slowly slide one leg out keeping your pelvis still. Only slide your leg as far as you can keep your pelvis still. Exhale as you bring the leg back to the start, contracting the lower abdominals as you do that. Keep your upper body relaxed. Do 5-10 on each side.    Hip Abduction (Standing)    Stand with support. Squeeze pelvic floor and hold. Engage your core muscles Lift right leg out to side, keeping toe forward. Lift with control.  Alternate sides.   Repeat __10_ times. Do _3__ times a day   Copyright  VHI. All rights reserved.
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
## 2016-06-22 ENCOUNTER — Ambulatory Visit: Payer: Medicare Other

## 2016-06-22 DIAGNOSIS — R262 Difficulty in walking, not elsewhere classified: Secondary | ICD-10-CM

## 2016-06-22 DIAGNOSIS — M6281 Muscle weakness (generalized): Secondary | ICD-10-CM

## 2016-06-22 DIAGNOSIS — M545 Low back pain, unspecified: Secondary | ICD-10-CM

## 2016-06-22 IMAGING — CR DG CHEST 2V
2 series · 2 of 2 positions shown · non-contrast
Comparison: 04/25/2013

CLINICAL DATA: Shortness of breath.

EXAM:
CHEST  2 VIEW

[chest ap]
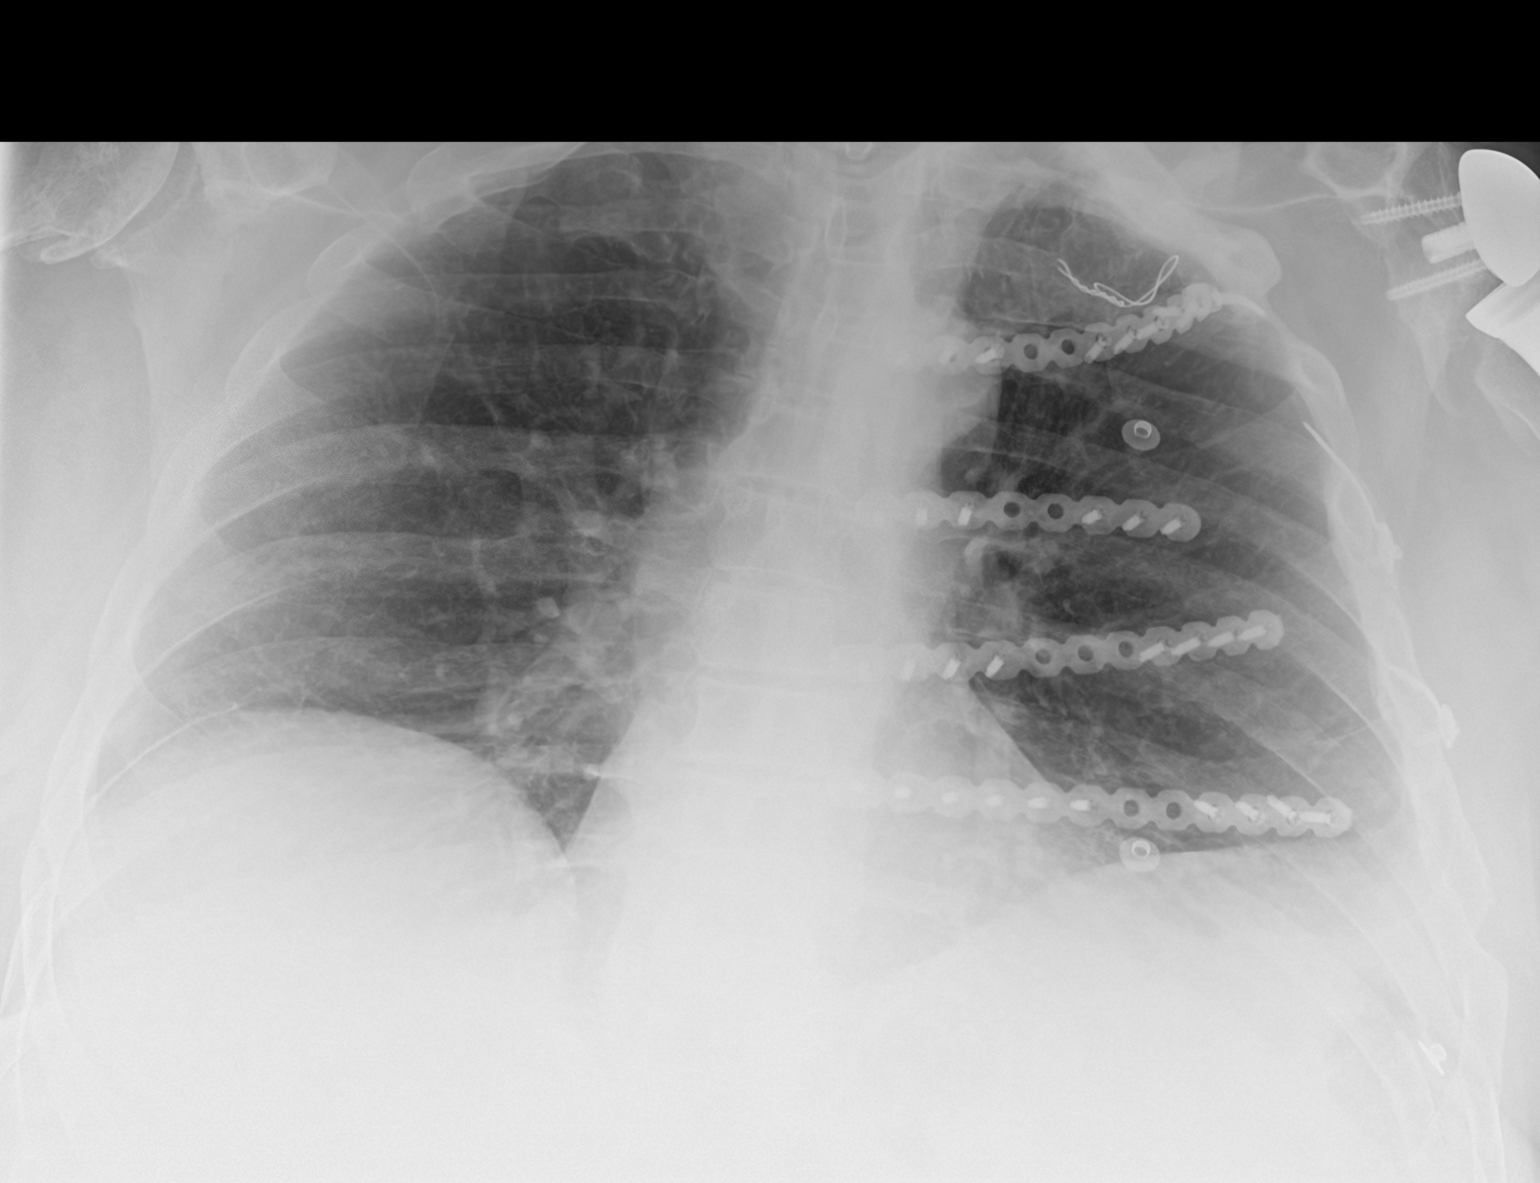

[chest lat]
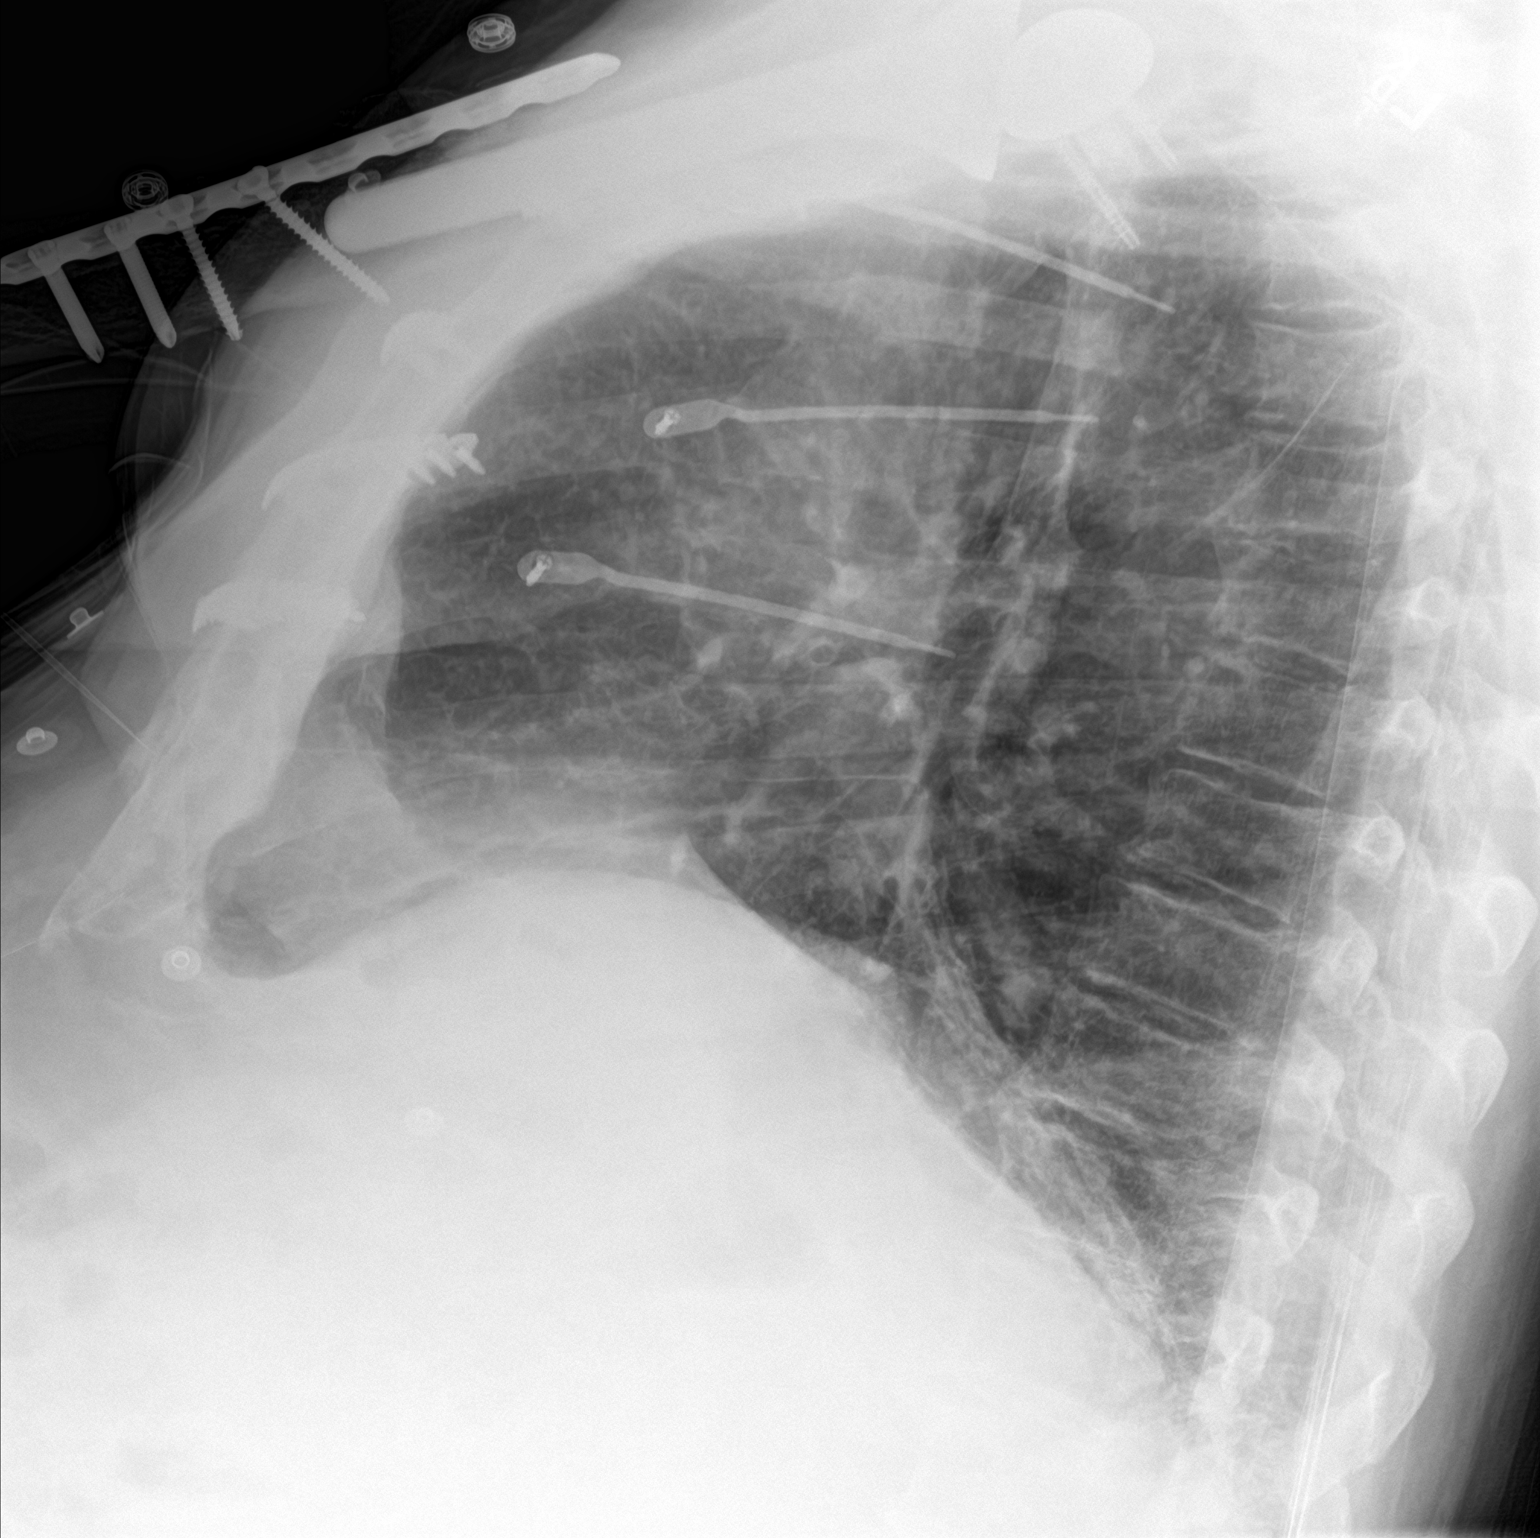

[2 of 2 positions shown; findings below may reference images not displayed]

FINDINGS: Lung volumes are low. Cardiomediastinal contours are unchanged.
Chronic blunting and scarring of left costophrenic angle. Mild
bronchitic change. No consolidation to suggest pneumonia. No
pneumothorax. Surgical change throughout the left hemithorax with
rib fracture repair. Reverse left shoulder arthroplasty. Advanced
right shoulder degenerative change.
IMPRESSION: 1. Mild bronchitic change.
2. Postsurgical change in the left hemithorax.

## 2016-06-22 IMAGING — CR DG CHEST 1V PORT
1 series · 1 of 1 positions shown · non-contrast
Comparison: 12/24/2015 and 04/25/2013

CLINICAL DATA: Shortness of breath.

EXAM:
PORTABLE CHEST 1 VIEW

[AP]
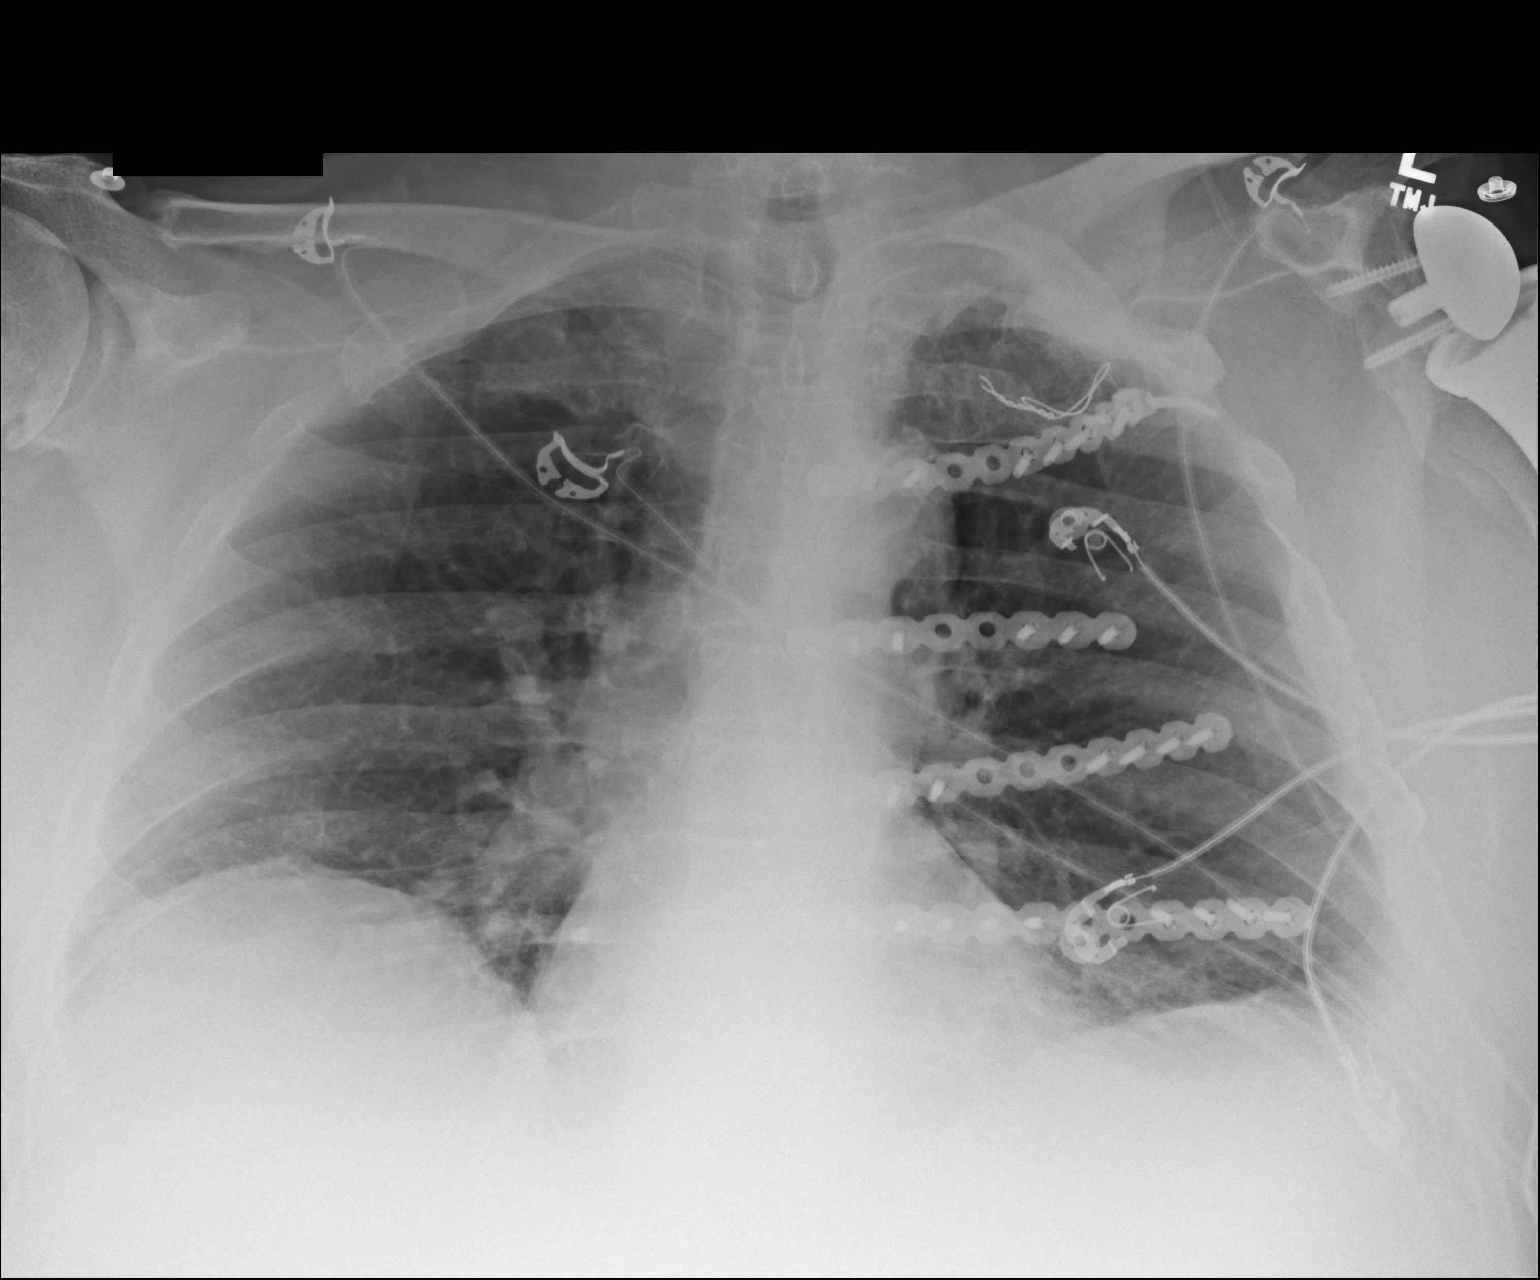

[1 of 1 positions shown; findings below may reference images not displayed]

FINDINGS: Heart size and pulmonary vascularity are normal. Peribronchial
thickening is unchanged. Postsurgical changes in the left hemi
thorax, stable. Moderate arthritis of the right glenohumeral joint.
Left reverse shoulder prosthesis.
IMPRESSION: Stable bronchitic changes.
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
## 2016-06-26 ENCOUNTER — Ambulatory Visit: Payer: Medicare Other | Admitting: Physical Therapy

## 2016-06-26 ENCOUNTER — Encounter: Payer: Self-pay | Admitting: Physical Therapy

## 2016-06-26 DIAGNOSIS — M545 Low back pain, unspecified: Secondary | ICD-10-CM

## 2016-06-26 DIAGNOSIS — M6281 Muscle weakness (generalized): Secondary | ICD-10-CM

## 2016-06-26 DIAGNOSIS — R262 Difficulty in walking, not elsewhere classified: Secondary | ICD-10-CM
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
## 2016-06-28 ENCOUNTER — Ambulatory Visit: Payer: Medicare Other | Admitting: Physical Therapy

## 2016-06-28 ENCOUNTER — Encounter: Payer: Self-pay | Admitting: Physical Therapy

## 2016-06-28 DIAGNOSIS — M545 Low back pain, unspecified: Secondary | ICD-10-CM

## 2016-06-28 DIAGNOSIS — M6281 Muscle weakness (generalized): Secondary | ICD-10-CM

## 2016-06-28 DIAGNOSIS — R262 Difficulty in walking, not elsewhere classified: Secondary | ICD-10-CM
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
## 2016-06-30 ENCOUNTER — Encounter: Payer: Self-pay | Admitting: Physical Therapy

## 2016-06-30 ENCOUNTER — Ambulatory Visit: Payer: Medicare Other | Admitting: Physical Therapy

## 2016-06-30 DIAGNOSIS — M545 Low back pain, unspecified: Secondary | ICD-10-CM

## 2016-06-30 DIAGNOSIS — M6281 Muscle weakness (generalized): Secondary | ICD-10-CM

## 2016-06-30 DIAGNOSIS — R262 Difficulty in walking, not elsewhere classified: Secondary | ICD-10-CM
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
## 2016-07-03 ENCOUNTER — Ambulatory Visit: Payer: Medicare Other

## 2016-07-03 DIAGNOSIS — M545 Low back pain, unspecified: Secondary | ICD-10-CM

## 2016-07-03 DIAGNOSIS — M6281 Muscle weakness (generalized): Secondary | ICD-10-CM

## 2016-07-03 DIAGNOSIS — R262 Difficulty in walking, not elsewhere classified: Secondary | ICD-10-CM
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
## 2016-07-05 ENCOUNTER — Ambulatory Visit: Payer: Medicare Other

## 2016-07-05 DIAGNOSIS — M545 Low back pain, unspecified: Secondary | ICD-10-CM

## 2016-07-05 DIAGNOSIS — M6281 Muscle weakness (generalized): Secondary | ICD-10-CM

## 2016-07-05 DIAGNOSIS — R262 Difficulty in walking, not elsewhere classified: Secondary | ICD-10-CM
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
## 2016-07-07 ENCOUNTER — Encounter: Payer: Self-pay | Admitting: Physical Therapy

## 2016-07-07 ENCOUNTER — Ambulatory Visit: Payer: Medicare Other | Admitting: Physical Therapy

## 2016-07-07 DIAGNOSIS — M545 Low back pain, unspecified: Secondary | ICD-10-CM

## 2016-07-07 DIAGNOSIS — M6281 Muscle weakness (generalized): Secondary | ICD-10-CM

## 2016-07-07 DIAGNOSIS — R262 Difficulty in walking, not elsewhere classified: Secondary | ICD-10-CM
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
## 2016-07-10 ENCOUNTER — Ambulatory Visit: Payer: Medicare Other | Admitting: Physical Therapy

## 2016-07-10 ENCOUNTER — Encounter: Payer: Self-pay | Admitting: Physical Therapy

## 2016-07-10 DIAGNOSIS — M6281 Muscle weakness (generalized): Secondary | ICD-10-CM

## 2016-07-10 DIAGNOSIS — R262 Difficulty in walking, not elsewhere classified: Secondary | ICD-10-CM

## 2016-07-10 DIAGNOSIS — M545 Low back pain, unspecified: Secondary | ICD-10-CM

## 2016-07-10 DIAGNOSIS — R531 Weakness: Secondary | ICD-10-CM
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
## 2016-07-12 ENCOUNTER — Ambulatory Visit: Payer: Medicare Other

## 2016-07-12 DIAGNOSIS — M545 Low back pain, unspecified: Secondary | ICD-10-CM

## 2016-07-12 DIAGNOSIS — M6281 Muscle weakness (generalized): Secondary | ICD-10-CM

## 2016-07-12 DIAGNOSIS — R262 Difficulty in walking, not elsewhere classified: Secondary | ICD-10-CM
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
## 2016-07-14 ENCOUNTER — Encounter: Payer: Self-pay | Admitting: Physical Therapy

## 2016-07-14 ENCOUNTER — Ambulatory Visit: Payer: Medicare Other | Admitting: Physical Therapy

## 2016-07-14 DIAGNOSIS — M545 Low back pain, unspecified: Secondary | ICD-10-CM

## 2016-07-14 DIAGNOSIS — R262 Difficulty in walking, not elsewhere classified: Secondary | ICD-10-CM

## 2016-07-14 DIAGNOSIS — M6281 Muscle weakness (generalized): Secondary | ICD-10-CM
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
## 2016-07-19 ENCOUNTER — Ambulatory Visit: Payer: Medicare Other | Attending: Neurological Surgery | Admitting: Physical Therapy

## 2016-07-19 ENCOUNTER — Encounter: Payer: Self-pay | Admitting: Physical Therapy

## 2016-07-19 DIAGNOSIS — M6281 Muscle weakness (generalized): Secondary | ICD-10-CM | POA: Insufficient documentation

## 2016-07-19 DIAGNOSIS — R29898 Other symptoms and signs involving the musculoskeletal system: Secondary | ICD-10-CM | POA: Diagnosis present

## 2016-07-19 DIAGNOSIS — R262 Difficulty in walking, not elsewhere classified: Secondary | ICD-10-CM | POA: Diagnosis present

## 2016-07-19 DIAGNOSIS — M545 Low back pain, unspecified: Secondary | ICD-10-CM

## 2016-07-19 DIAGNOSIS — R2681 Unsteadiness on feet: Secondary | ICD-10-CM | POA: Diagnosis present
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
## 2016-07-21 ENCOUNTER — Ambulatory Visit: Payer: Medicare Other | Admitting: Physical Therapy

## 2016-07-21 DIAGNOSIS — M6281 Muscle weakness (generalized): Secondary | ICD-10-CM

## 2016-07-21 DIAGNOSIS — M545 Low back pain, unspecified: Secondary | ICD-10-CM

## 2016-07-21 DIAGNOSIS — R262 Difficulty in walking, not elsewhere classified: Secondary | ICD-10-CM
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
## 2016-07-24 ENCOUNTER — Ambulatory Visit: Payer: Medicare Other | Admitting: Physical Therapy

## 2016-07-24 ENCOUNTER — Encounter: Payer: Self-pay | Admitting: Physical Therapy

## 2016-07-24 DIAGNOSIS — M545 Low back pain, unspecified: Secondary | ICD-10-CM

## 2016-07-24 DIAGNOSIS — R262 Difficulty in walking, not elsewhere classified: Secondary | ICD-10-CM

## 2016-07-24 DIAGNOSIS — M6281 Muscle weakness (generalized): Secondary | ICD-10-CM | POA: Diagnosis not present
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
## 2016-07-26 ENCOUNTER — Ambulatory Visit: Payer: Medicare Other

## 2016-07-26 DIAGNOSIS — M6281 Muscle weakness (generalized): Secondary | ICD-10-CM

## 2016-07-26 DIAGNOSIS — M545 Low back pain, unspecified: Secondary | ICD-10-CM

## 2016-07-26 DIAGNOSIS — R262 Difficulty in walking, not elsewhere classified: Secondary | ICD-10-CM
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
## 2016-07-28 ENCOUNTER — Ambulatory Visit: Payer: Medicare Other | Admitting: Physical Therapy

## 2016-07-28 ENCOUNTER — Encounter: Payer: Self-pay | Admitting: Physical Therapy

## 2016-07-28 DIAGNOSIS — M545 Low back pain, unspecified: Secondary | ICD-10-CM

## 2016-07-28 DIAGNOSIS — M6281 Muscle weakness (generalized): Secondary | ICD-10-CM

## 2016-07-28 DIAGNOSIS — R262 Difficulty in walking, not elsewhere classified: Secondary | ICD-10-CM
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
## 2016-07-31 ENCOUNTER — Encounter: Payer: Self-pay | Admitting: Physical Therapy

## 2016-07-31 ENCOUNTER — Ambulatory Visit: Payer: Medicare Other | Admitting: Physical Therapy

## 2016-07-31 DIAGNOSIS — M6281 Muscle weakness (generalized): Secondary | ICD-10-CM | POA: Diagnosis not present

## 2016-07-31 DIAGNOSIS — M545 Low back pain, unspecified: Secondary | ICD-10-CM

## 2016-07-31 DIAGNOSIS — R262 Difficulty in walking, not elsewhere classified: Secondary | ICD-10-CM
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
## 2016-08-02 ENCOUNTER — Ambulatory Visit: Payer: Medicare Other

## 2016-08-02 DIAGNOSIS — M545 Low back pain, unspecified: Secondary | ICD-10-CM

## 2016-08-02 DIAGNOSIS — M6281 Muscle weakness (generalized): Secondary | ICD-10-CM

## 2016-08-02 DIAGNOSIS — R262 Difficulty in walking, not elsewhere classified: Secondary | ICD-10-CM
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
## 2016-08-04 ENCOUNTER — Ambulatory Visit: Payer: Medicare Other | Admitting: Physical Therapy

## 2016-08-04 ENCOUNTER — Encounter: Payer: Self-pay | Admitting: Physical Therapy

## 2016-08-04 DIAGNOSIS — M545 Low back pain, unspecified: Secondary | ICD-10-CM

## 2016-08-04 DIAGNOSIS — R262 Difficulty in walking, not elsewhere classified: Secondary | ICD-10-CM

## 2016-08-04 DIAGNOSIS — M6281 Muscle weakness (generalized): Secondary | ICD-10-CM
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
## 2016-08-07 ENCOUNTER — Encounter: Payer: Self-pay | Admitting: Physical Therapy

## 2016-08-07 ENCOUNTER — Ambulatory Visit: Payer: Medicare Other | Admitting: Physical Therapy

## 2016-08-07 DIAGNOSIS — R29898 Other symptoms and signs involving the musculoskeletal system: Secondary | ICD-10-CM

## 2016-08-07 DIAGNOSIS — M6281 Muscle weakness (generalized): Secondary | ICD-10-CM

## 2016-08-07 DIAGNOSIS — R262 Difficulty in walking, not elsewhere classified: Secondary | ICD-10-CM

## 2016-08-07 DIAGNOSIS — R2681 Unsteadiness on feet: Secondary | ICD-10-CM
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
## 2016-08-09 ENCOUNTER — Encounter: Payer: Self-pay | Admitting: Physical Therapy

## 2016-08-09 ENCOUNTER — Ambulatory Visit: Payer: Medicare Other | Admitting: Physical Therapy

## 2016-08-09 DIAGNOSIS — M6281 Muscle weakness (generalized): Secondary | ICD-10-CM

## 2016-08-09 DIAGNOSIS — R262 Difficulty in walking, not elsewhere classified: Secondary | ICD-10-CM

## 2016-08-09 DIAGNOSIS — R2681 Unsteadiness on feet: Secondary | ICD-10-CM
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
## 2016-08-14 ENCOUNTER — Encounter: Payer: Medicare Other | Admitting: Physical Therapy

## 2016-08-16 ENCOUNTER — Encounter: Payer: Medicare Other | Admitting: Physical Therapy

## 2016-08-16 ENCOUNTER — Ambulatory Visit: Payer: Medicare Other | Admitting: Physical Therapy

## 2016-08-16 ENCOUNTER — Encounter: Payer: Self-pay | Admitting: Physical Therapy

## 2016-08-16 DIAGNOSIS — R262 Difficulty in walking, not elsewhere classified: Secondary | ICD-10-CM

## 2016-08-16 DIAGNOSIS — M6281 Muscle weakness (generalized): Secondary | ICD-10-CM | POA: Diagnosis not present

## 2016-08-16 DIAGNOSIS — R2681 Unsteadiness on feet: Secondary | ICD-10-CM
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
## 2016-08-18 ENCOUNTER — Encounter: Payer: Self-pay | Admitting: Physical Therapy

## 2016-08-18 ENCOUNTER — Ambulatory Visit: Payer: Medicare Other | Attending: Neurological Surgery | Admitting: Physical Therapy

## 2016-08-18 DIAGNOSIS — R531 Weakness: Secondary | ICD-10-CM | POA: Diagnosis present

## 2016-08-18 DIAGNOSIS — R29898 Other symptoms and signs involving the musculoskeletal system: Secondary | ICD-10-CM | POA: Diagnosis not present

## 2016-08-18 DIAGNOSIS — M545 Low back pain: Secondary | ICD-10-CM | POA: Insufficient documentation

## 2016-08-18 DIAGNOSIS — R2689 Other abnormalities of gait and mobility: Secondary | ICD-10-CM | POA: Diagnosis present

## 2016-08-18 DIAGNOSIS — R2681 Unsteadiness on feet: Secondary | ICD-10-CM

## 2016-08-18 DIAGNOSIS — M6281 Muscle weakness (generalized): Secondary | ICD-10-CM | POA: Insufficient documentation

## 2016-08-18 DIAGNOSIS — R262 Difficulty in walking, not elsewhere classified: Secondary | ICD-10-CM | POA: Diagnosis present
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
## 2016-08-22 ENCOUNTER — Encounter: Payer: Medicare Other | Admitting: Physical Therapy

## 2016-08-23 ENCOUNTER — Ambulatory Visit: Payer: Medicare Other | Admitting: Physical Therapy

## 2016-08-23 ENCOUNTER — Encounter: Payer: Self-pay | Admitting: Physical Therapy

## 2016-08-23 DIAGNOSIS — M6281 Muscle weakness (generalized): Secondary | ICD-10-CM

## 2016-08-23 DIAGNOSIS — R2681 Unsteadiness on feet: Secondary | ICD-10-CM

## 2016-08-23 DIAGNOSIS — R262 Difficulty in walking, not elsewhere classified: Secondary | ICD-10-CM
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
## 2016-08-25 ENCOUNTER — Ambulatory Visit: Payer: Medicare Other | Admitting: Physical Therapy

## 2016-08-25 ENCOUNTER — Encounter: Payer: Self-pay | Admitting: Physical Therapy

## 2016-08-25 DIAGNOSIS — M6281 Muscle weakness (generalized): Secondary | ICD-10-CM | POA: Diagnosis not present

## 2016-08-25 DIAGNOSIS — R2689 Other abnormalities of gait and mobility: Secondary | ICD-10-CM

## 2016-08-25 DIAGNOSIS — R29898 Other symptoms and signs involving the musculoskeletal system: Secondary | ICD-10-CM

## 2016-08-25 DIAGNOSIS — R531 Weakness: Secondary | ICD-10-CM

## 2016-08-25 DIAGNOSIS — R2681 Unsteadiness on feet: Secondary | ICD-10-CM

## 2016-08-25 DIAGNOSIS — M545 Low back pain, unspecified: Secondary | ICD-10-CM

## 2016-08-25 DIAGNOSIS — R262 Difficulty in walking, not elsewhere classified: Secondary | ICD-10-CM
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
## 2016-08-28 ENCOUNTER — Ambulatory Visit: Payer: Medicare Other | Admitting: Physical Therapy

## 2016-08-28 ENCOUNTER — Encounter: Payer: Self-pay | Admitting: Physical Therapy

## 2016-08-28 DIAGNOSIS — R2681 Unsteadiness on feet: Secondary | ICD-10-CM

## 2016-08-28 DIAGNOSIS — M6281 Muscle weakness (generalized): Secondary | ICD-10-CM
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
## 2016-08-30 ENCOUNTER — Encounter: Payer: Medicare Other | Admitting: Physical Therapy

## 2016-09-01 ENCOUNTER — Encounter: Payer: Self-pay | Admitting: Physical Therapy

## 2016-09-01 ENCOUNTER — Ambulatory Visit: Payer: Medicare Other | Admitting: Physical Therapy

## 2016-09-01 DIAGNOSIS — R2681 Unsteadiness on feet: Secondary | ICD-10-CM

## 2016-09-01 DIAGNOSIS — M6281 Muscle weakness (generalized): Secondary | ICD-10-CM
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
## 2016-09-04 ENCOUNTER — Encounter: Payer: Self-pay | Admitting: Physical Therapy

## 2016-09-04 ENCOUNTER — Ambulatory Visit: Payer: Medicare Other | Admitting: Physical Therapy

## 2016-09-04 DIAGNOSIS — M6281 Muscle weakness (generalized): Secondary | ICD-10-CM | POA: Diagnosis not present

## 2016-09-04 DIAGNOSIS — R262 Difficulty in walking, not elsewhere classified: Secondary | ICD-10-CM

## 2016-09-04 DIAGNOSIS — R2681 Unsteadiness on feet: Secondary | ICD-10-CM
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
## 2016-09-08 ENCOUNTER — Encounter: Payer: Self-pay | Admitting: Physical Therapy

## 2016-09-08 ENCOUNTER — Ambulatory Visit: Payer: Medicare Other | Admitting: Physical Therapy

## 2016-09-08 DIAGNOSIS — R2681 Unsteadiness on feet: Secondary | ICD-10-CM

## 2016-09-08 DIAGNOSIS — M6281 Muscle weakness (generalized): Secondary | ICD-10-CM | POA: Diagnosis not present

## 2016-09-08 DIAGNOSIS — R262 Difficulty in walking, not elsewhere classified: Secondary | ICD-10-CM
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
## 2016-09-13 ENCOUNTER — Ambulatory Visit: Payer: Medicare Other | Admitting: Physical Therapy

## 2016-09-13 ENCOUNTER — Encounter: Payer: Self-pay | Admitting: Physical Therapy

## 2016-09-13 DIAGNOSIS — M6281 Muscle weakness (generalized): Secondary | ICD-10-CM | POA: Diagnosis not present

## 2016-09-13 DIAGNOSIS — R2681 Unsteadiness on feet: Secondary | ICD-10-CM

## 2016-09-13 DIAGNOSIS — R262 Difficulty in walking, not elsewhere classified: Secondary | ICD-10-CM
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
## 2016-09-15 ENCOUNTER — Encounter: Payer: Self-pay | Admitting: Physical Therapy

## 2016-09-15 ENCOUNTER — Ambulatory Visit: Payer: Medicare Other | Admitting: Physical Therapy

## 2016-09-15 DIAGNOSIS — R29898 Other symptoms and signs involving the musculoskeletal system: Secondary | ICD-10-CM

## 2016-09-15 DIAGNOSIS — R262 Difficulty in walking, not elsewhere classified: Secondary | ICD-10-CM

## 2016-09-15 DIAGNOSIS — R2681 Unsteadiness on feet: Secondary | ICD-10-CM

## 2016-09-15 DIAGNOSIS — M6281 Muscle weakness (generalized): Secondary | ICD-10-CM | POA: Diagnosis not present
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
## 2016-09-18 ENCOUNTER — Ambulatory Visit: Payer: Medicare Other | Attending: Neurological Surgery

## 2016-09-18 DIAGNOSIS — R29898 Other symptoms and signs involving the musculoskeletal system: Secondary | ICD-10-CM | POA: Insufficient documentation

## 2016-09-18 DIAGNOSIS — M6281 Muscle weakness (generalized): Secondary | ICD-10-CM | POA: Insufficient documentation

## 2016-09-18 DIAGNOSIS — R262 Difficulty in walking, not elsewhere classified: Secondary | ICD-10-CM | POA: Diagnosis present

## 2016-09-18 DIAGNOSIS — R2681 Unsteadiness on feet: Secondary | ICD-10-CM | POA: Diagnosis present
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
## 2016-09-20 ENCOUNTER — Encounter: Payer: Self-pay | Admitting: Physical Therapy

## 2016-09-20 ENCOUNTER — Ambulatory Visit: Payer: Medicare Other | Admitting: Physical Therapy

## 2016-09-20 DIAGNOSIS — M6281 Muscle weakness (generalized): Secondary | ICD-10-CM | POA: Diagnosis not present

## 2016-09-20 DIAGNOSIS — R262 Difficulty in walking, not elsewhere classified: Secondary | ICD-10-CM

## 2016-09-20 NOTE — Patient Instructions (Addendum)
Bilateral Front Arm Raise    Standing in neutral posture, on floor, raise both arms to shoulder height in front. Hold _30___ seconds. , __3__ sets. .  Copyright  VHI. All rights reserved.  Bilateral Side Arm Raise    Standing in neutral posture, on floor, sweep arms out from sides and up to shoulder height. Hold _30___ seconds. , __1__ sets.   Copyright  VHI. All rights reserved.  Roller: Two-Leg Stand (Frontal) - Arms at [employer], arms at sides, feet perpendicular to length  Hold each of the checked options __60__ seconds:  Eyes closed Head turned to one side, then other Do __3__ repetitions, __1__ sets.   Copyright  VHI. All rights reserved.  [facility] [address], [place], [place] [address] Phone # [phone] Fax [phone]
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
## 2016-09-26 ENCOUNTER — Ambulatory Visit: Payer: Medicare Other

## 2016-09-26 DIAGNOSIS — R262 Difficulty in walking, not elsewhere classified: Secondary | ICD-10-CM

## 2016-09-26 DIAGNOSIS — M6281 Muscle weakness (generalized): Secondary | ICD-10-CM | POA: Diagnosis not present
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
## 2016-09-29 ENCOUNTER — Ambulatory Visit: Payer: Medicare Other | Admitting: Physical Therapy

## 2016-09-29 ENCOUNTER — Encounter: Payer: Self-pay | Admitting: Physical Therapy

## 2016-09-29 DIAGNOSIS — R2681 Unsteadiness on feet: Secondary | ICD-10-CM

## 2016-09-29 DIAGNOSIS — M6281 Muscle weakness (generalized): Secondary | ICD-10-CM

## 2016-09-29 DIAGNOSIS — R262 Difficulty in walking, not elsewhere classified: Secondary | ICD-10-CM
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
## 2016-10-09 ENCOUNTER — Ambulatory Visit: Payer: Medicare Other | Admitting: Physical Therapy

## 2016-10-09 ENCOUNTER — Encounter: Payer: Self-pay | Admitting: Physical Therapy

## 2016-10-09 DIAGNOSIS — M6281 Muscle weakness (generalized): Secondary | ICD-10-CM | POA: Diagnosis not present

## 2016-10-09 DIAGNOSIS — R2681 Unsteadiness on feet: Secondary | ICD-10-CM

## 2016-10-09 DIAGNOSIS — R262 Difficulty in walking, not elsewhere classified: Secondary | ICD-10-CM
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
## 2016-10-11 IMAGING — RF DG MYELOGRAM LUMBAR
8 series · 8 of 8 positions shown · non-contrast
Comparison: Lumbar MRI 11/04/2015 and earlier. CT Abdomen and
Pelvis 06/09/2008

CLINICAL DATA: 79-year-old male with lumbar back pain radiating to
the posterior right hip and down the lateral aspect of the right
lower extremity stopping at the ankle. Patient unable to lie flat
for MRI recently. Subsequent encounter.

EXAM:
LUMBAR MYELOGRAM
FLUOROSCOPY TIME:  0 minutes 36 seconds.
PROCEDURE:
Lumbar puncture and intrathecal contrast administration were
performed by Dr. NIPUN COLMENARES who will separately report for the
portion of the procedure. I personally supervised acquisition of the
myelogram images.
TECHNIQUE: Contiguous axial images were obtained through the Lumbar spine after
the intrathecal infusion of infusion. Coronal and sagittal
reconstructions were obtained of the axial image sets.

[Series 1: cp_standard · 0.19mm/px · 1 of 1 slices shown]
[im 1/1]
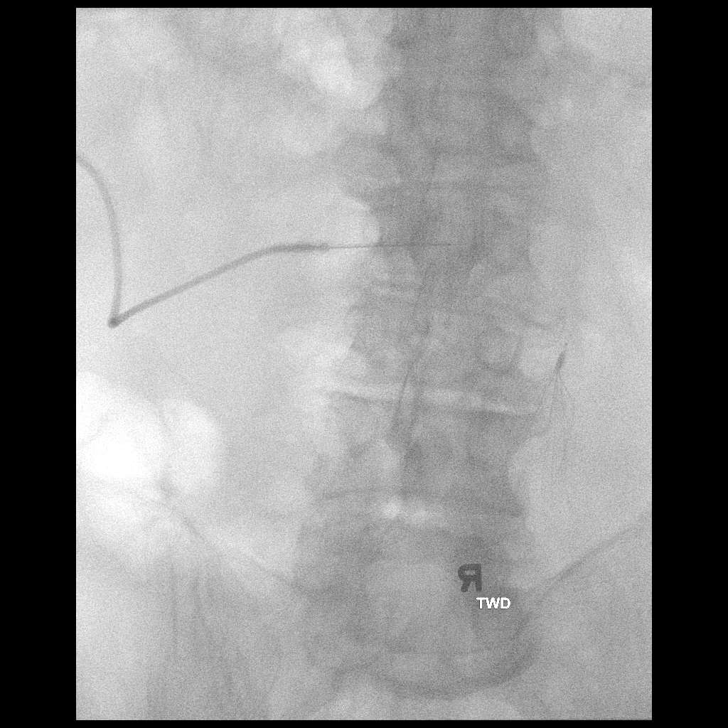

[Series 2: fluoro_myelogram_singleshot_bw · 0.19mm/px · 1 of 1 slices shown (1 of 7)]
[im 1/1]
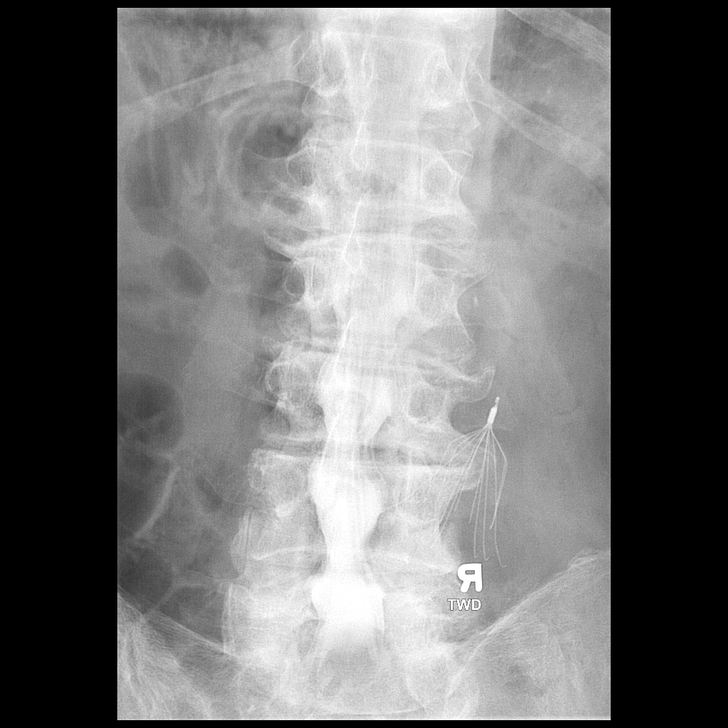

[Series 3: fluoro_myelogram_singleshot_bw · 0.19mm/px · 1 of 1 slices shown (2 of 7)]
[im 1/1]
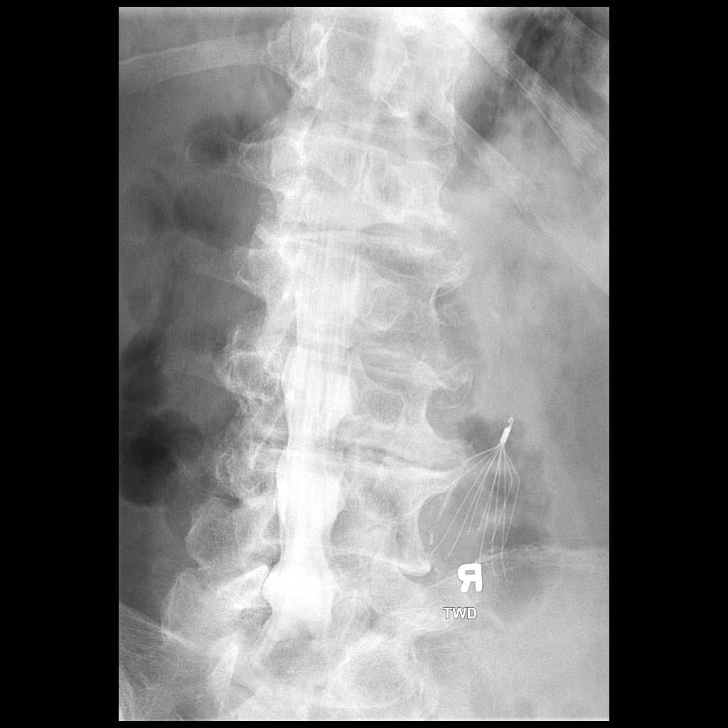

[Series 4: fluoro_myelogram_singleshot_bw · 0.19mm/px · 1 of 1 slices shown (3 of 7)]
[im 1/1]
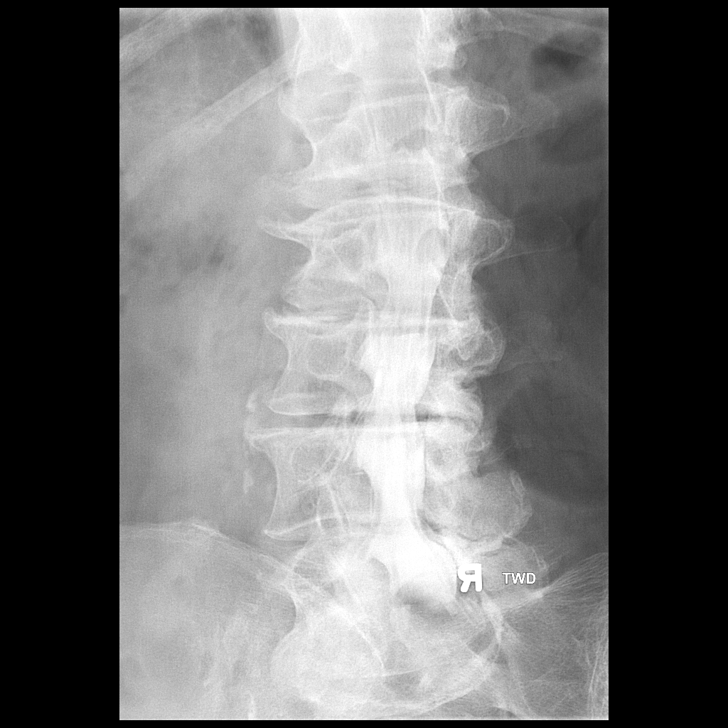

[Series 5: fluoro_myelogram_singleshot_bw · 0.18mm/px · 1 of 1 slices shown (4 of 7)]
[im 1/1]
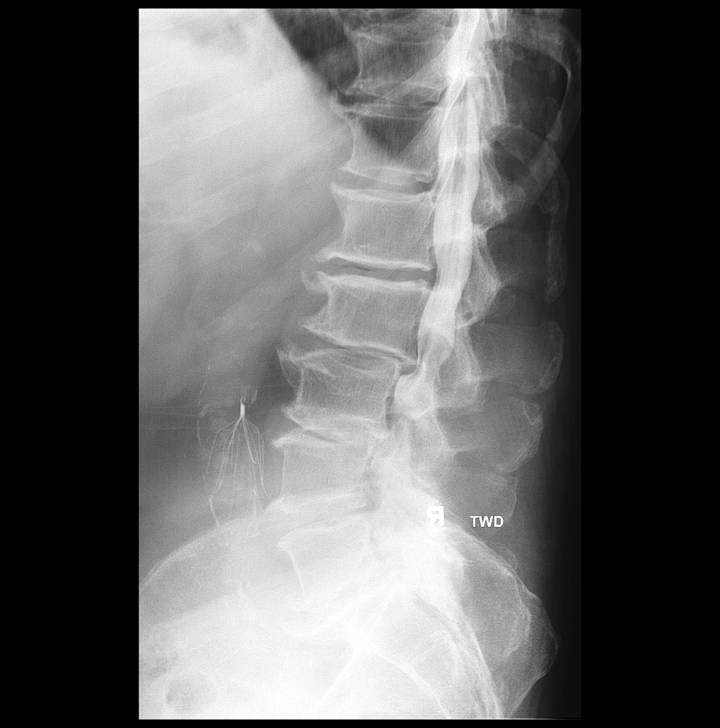

[Series 6: fluoro_myelogram_singleshot_bw · 0.18mm/px · 1 of 1 slices shown (5 of 7)]
[im 1/1]
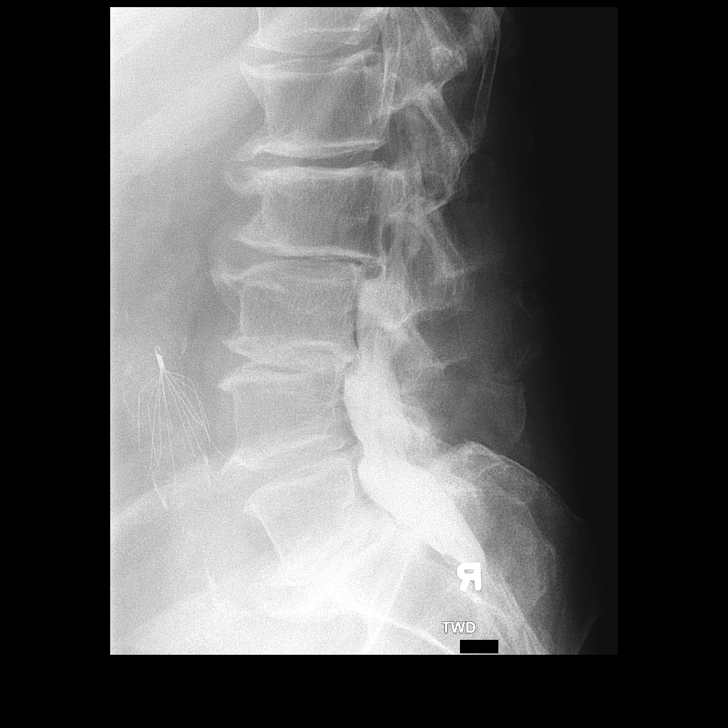

[Series 7: fluoro_myelogram_singleshot_bw · 0.17mm/px · 1 of 1 slices shown (6 of 7)]
[im 1/1]
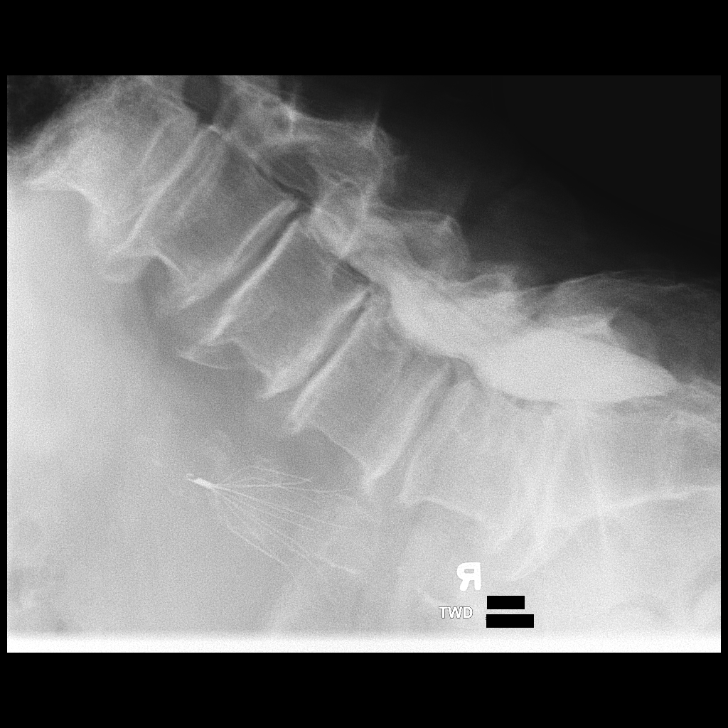

[Series 8: fluoro_myelogram_singleshot_bw · 0.17mm/px · 1 of 1 slices shown (7 of 7)]
[im 1/1]
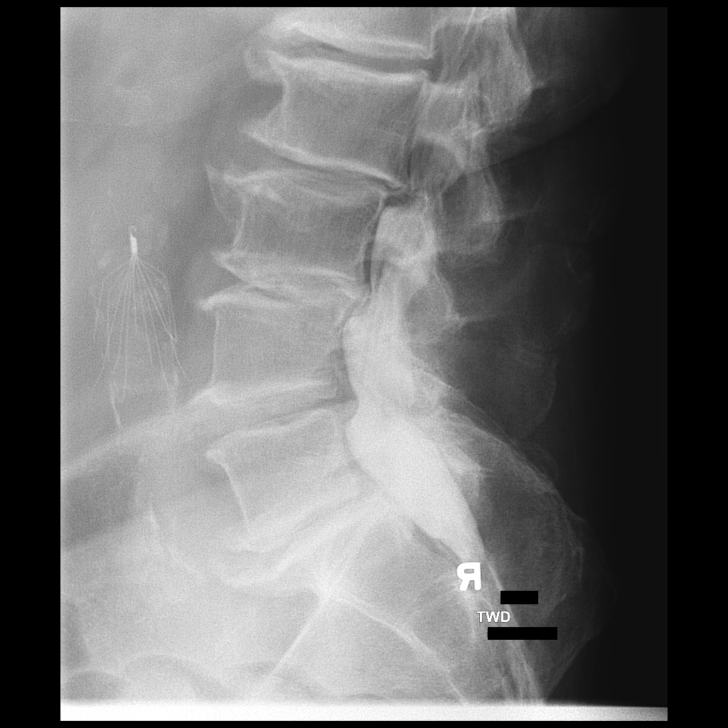

[8 of 8 positions shown; findings below may reference images not displayed]

FINDINGS: LUMBAR MYELOGRAM FINDINGS:

Normal lumbar segmentation corresponding to the numbering system on
the 8143 MRI. Good intrathecal contrast opacification. Incidental
right side lower abdominal IVC filter. Calcified aortic
atherosclerosis.

Diffuse chronic disc and endplate degeneration. Multilevel
circumferential mass effect on the thecal sac which appears most
pronounced at L2-L3 and L3-L4.

Mild retrolisthesis at both L2-L3 and L3-L4. Upright lateral views
in neutral flexion and extension. Mild anterolisthesis at L5-S1 is
more apparent when upright. Decreased range of motion in extension.
No definite abnormal motion in flexion extension.

CT LUMBAR MYELOGRAM FINDINGS:

Infrarenal IVC filter is new since 9112. One of the medial limbs
extends outside of the medial left IVC wall (series 4, image 82).
Calcified aortic and iliac artery atherosclerosis. Otherwise
negative visualized noncontrast abdominal viscera.

Vertebral height and alignment appears stable since 8143. The
multilevel mild spondylolisthesis has progressed since 9112. Visible
sacrum and SI joints intact. No acute osseous abnormality
identified.

Normal myelographic appearance of the lower thoracic spinal cord
with conus at L1.

T11-T12: Mostly anterior disc bulging. Moderate to severe facet
hypertrophy greater on the left with vacuum facet phenomena. No
stenosis.

T12-L1: Mostly anterior disc bulging. Moderate facet hypertrophy. No
stenosis.

L1-L2: Disc space loss with vacuum disc. Anterior eccentric
circumferential disc osteophyte complex. Moderate to severe facet
hypertrophy greater on the left were there is vacuum facet. Thecal
sac patency appears stable since [REDACTED], no definite stenosis.

L2-L3: Severe disc space loss with vacuum disc. Bulky right and
anterior eccentric circumferential disc osteophyte complex. Mild to
moderate facet hypertrophy. Mild right lateral recess stenosis
appears more conspicuous than on the recent MRI (series 5, image
61). No significant spinal stenosis. Mild left L2 foraminal stenosis
is stable.

L3-L4: Severe disc space loss with vacuum disc and bulky right
eccentric circumferential disc osteophyte complex. Stable mild facet
hypertrophy. Increased conspicuity of right lateral recess stenosis
compared to [DATE], image 78). No significant spinal
stenosis. Mild left and moderate to severe right L3 foraminal
stenosis appears stable.

L4-L5: Disc space loss with vacuum disc. Right eccentric
circumferential disc osteophyte complex. Moderate to severe facet
hypertrophy greater on the right. There is some lateral mass effect
on the thecal sac but no significant spinal stenosis. Mild right
lateral recess stenosis appears stable. There is moderate to severe
left and severe right L4 foraminal stenosis which appears stable.

L5-S1: Disc space loss with mostly anterior disc osteophyte complex.
Moderate facet hypertrophy greater on the left, where left facet
ankylosis is suggested (series 5, image 101). No spinal or lateral
recess stenosis. Severe left and moderate to severe right L5
foraminal stenosis appears stable.

S1 spinal canal and neural foramen are widely patent.
IMPRESSION: LUMBAR MYELOGRAM IMPRESSION:

1. Mild spondylolisthesis at L2-L3, L3-L4, and L5-S1. No abnormal
motion identified with flexion-extension.
2. Chronic disc and endplate degeneration with multilevel mass
effect on the thecal sac.

CT LUMBAR MYELOGRAM IMPRESSION:

1. Due to a fairly capacious spinal canal, and despite widespread
advanced disc and endplate degeneration in the lumbar spine, there
is no significant spinal stenosis.
2. Superimposed intermittent moderate and severe facet degeneration,
including in the lower thoracic spine. Note that at L5-S1 the left
facet is fused/fusing (series 5, image 101).
3. Multifactorial right lateral recess stenosis at L2-L3, L3-L4, and
L4-L5 appears somewhat more pronounced than on the October 2015
MRI.
4. In large part due to the facet disease and degenerative endplate
spurring there is moderate or severe neural foraminal stenosis at
the right L3, bilateral L4 (worse on the right) and bilateral L5
(worse on the left nerve levels. This appears not significantly
changed since [DATE]. Calcified aortic atherosclerosis. Infrarenal IVC filter which
appears to have been remodeled into the wall of the vessel since
placement.

## 2016-10-11 IMAGING — CT CT L SPINE W/ CM
3 of 10 series · 11 of 33 positions shown, 13 images · IV contrast (Omni 300)
Comparison: Lumbar MRI 11/04/2015 and earlier. CT Abdomen and
Pelvis 06/09/2008

CLINICAL DATA: 79-year-old male with lumbar back pain radiating to
the posterior right hip and down the lateral aspect of the right
lower extremity stopping at the ankle. Patient unable to lie flat
for MRI recently. Subsequent encounter.

EXAM:
LUMBAR MYELOGRAM
FLUOROSCOPY TIME:  0 minutes 36 seconds.
PROCEDURE:
Lumbar puncture and intrathecal contrast administration were
performed by Dr. NIPUN COLMENARES who will separately report for the
portion of the procedure. I personally supervised acquisition of the
myelogram images.
TECHNIQUE: Contiguous axial images were obtained through the Lumbar spine after
the intrathecal infusion of infusion. Coronal and sagittal
reconstructions were obtained of the axial image sets.

[Series 4: l-spine 2.0 st · axial · 0.27mm/px · z∈[+1059,+1307]mm · 3 of 123 slices shown, 4 images]
[im 1/123  soft-tissue]
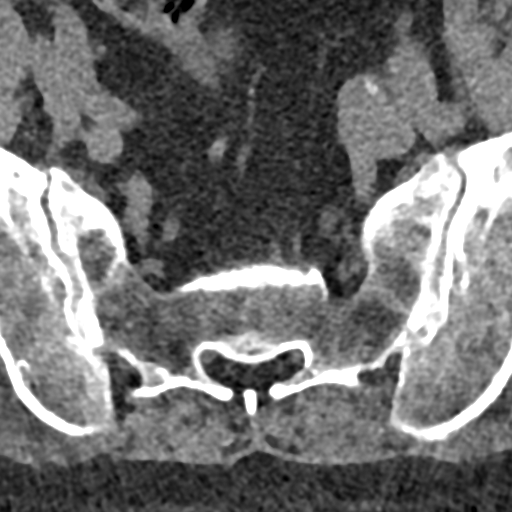
[im 1/123  bone]
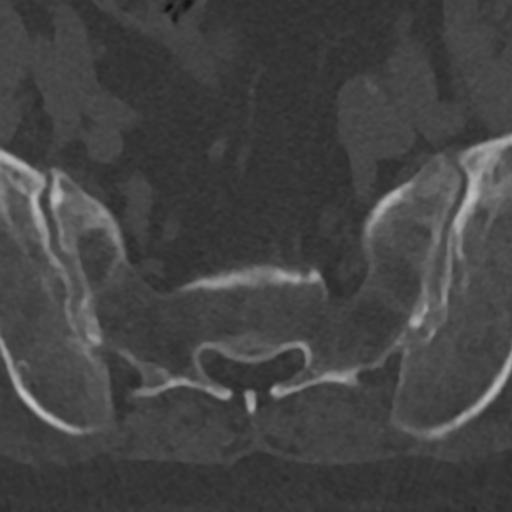
[im 62/123  bone]
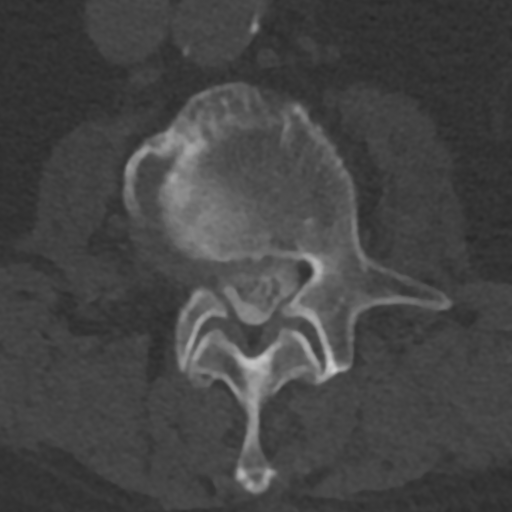
[im 123/123  bone]
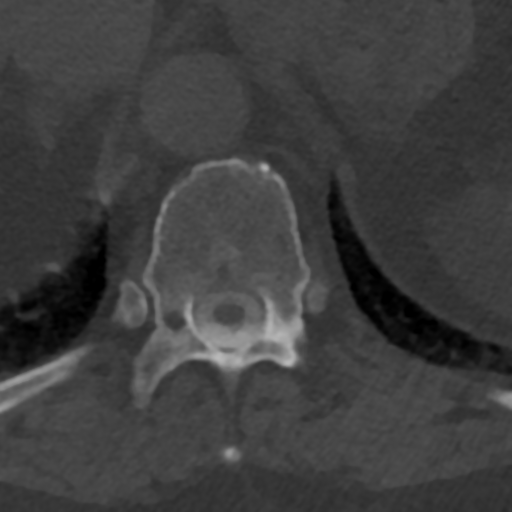

[Series 605: sagittal s.t. · sagittal · 0.49mm/px · 5 of 63 slices shown, 6 images]
[im 21/63  bone]
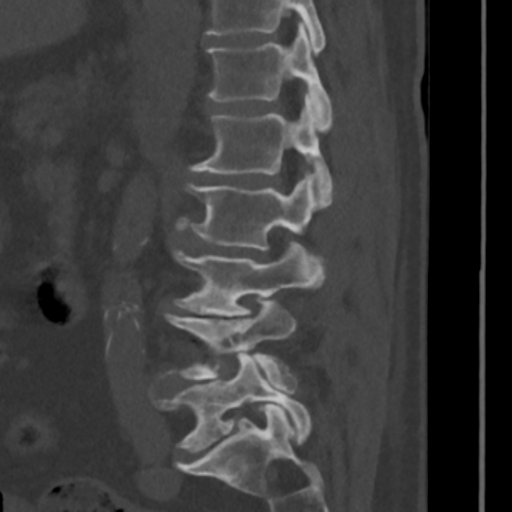
[im 26/63  bone]
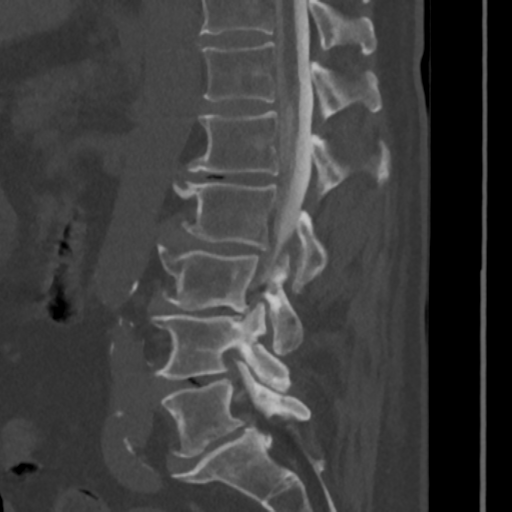
[im 32/63  soft-tissue]
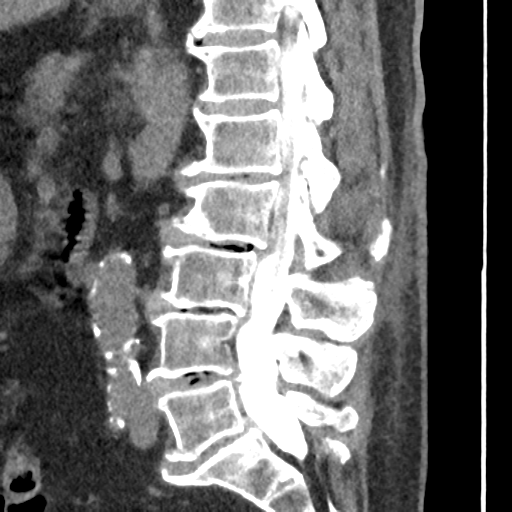
[im 32/63  bone]
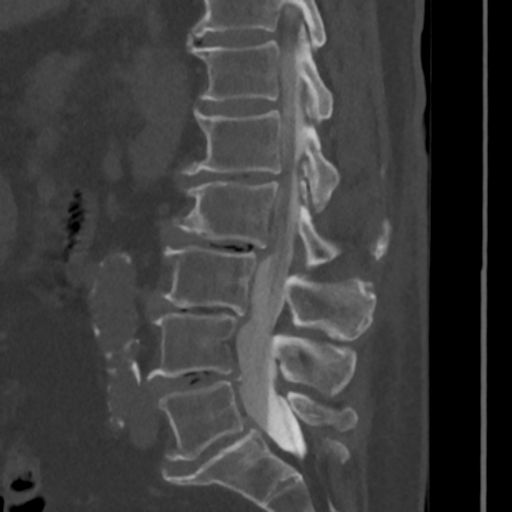
[im 37/63  bone]
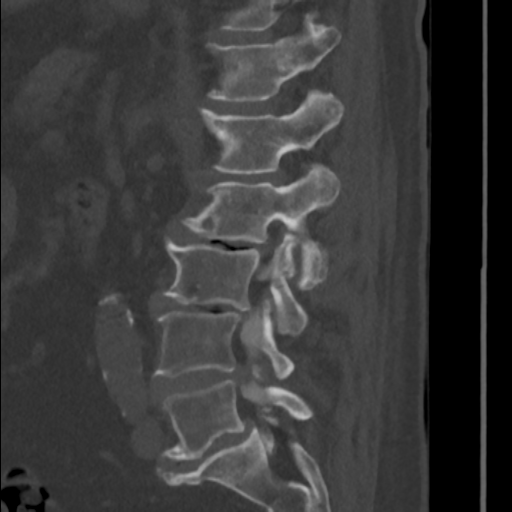
[im 42/63  bone]
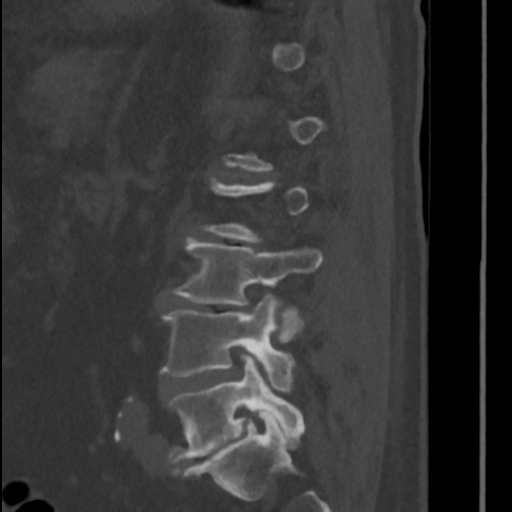

[Series 606: coronal s.t. · coronal · 0.49mm/px · 3 of 71 slices shown]
[im 15/71  bone]
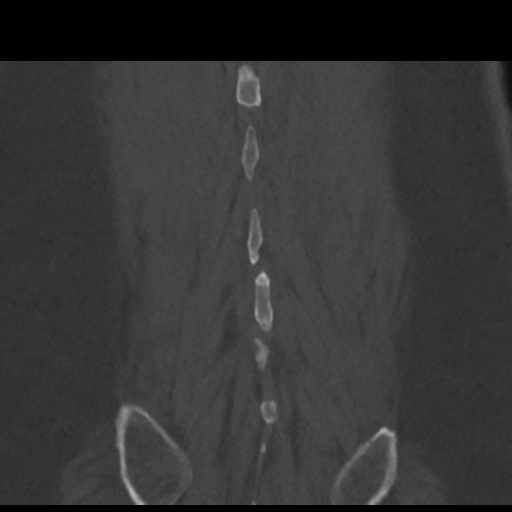
[im 29/71  bone]
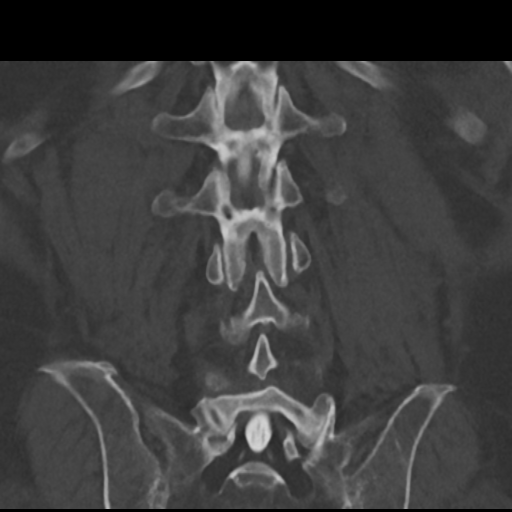
[im 43/71  bone]
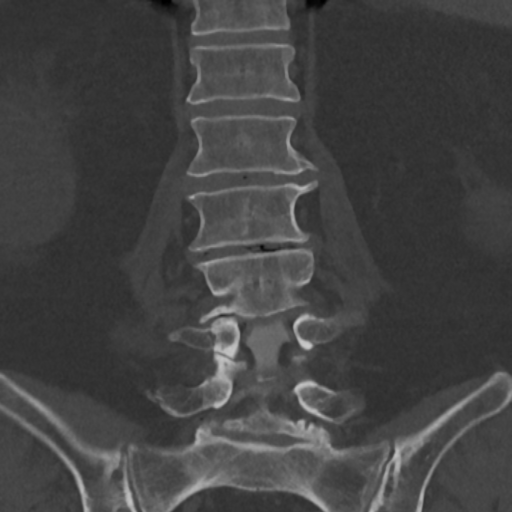

[11 of 33 positions shown; findings below may reference images not displayed]

FINDINGS: LUMBAR MYELOGRAM FINDINGS:

Normal lumbar segmentation corresponding to the numbering system on
the 8143 MRI. Good intrathecal contrast opacification. Incidental
right side lower abdominal IVC filter. Calcified aortic
atherosclerosis.

Diffuse chronic disc and endplate degeneration. Multilevel
circumferential mass effect on the thecal sac which appears most
pronounced at L2-L3 and L3-L4.

Mild retrolisthesis at both L2-L3 and L3-L4. Upright lateral views
in neutral flexion and extension. Mild anterolisthesis at L5-S1 is
more apparent when upright. Decreased range of motion in extension.
No definite abnormal motion in flexion extension.

CT LUMBAR MYELOGRAM FINDINGS:

Infrarenal IVC filter is new since 9112. One of the medial limbs
extends outside of the medial left IVC wall (series 4, image 82).
Calcified aortic and iliac artery atherosclerosis. Otherwise
negative visualized noncontrast abdominal viscera.

Vertebral height and alignment appears stable since 8143. The
multilevel mild spondylolisthesis has progressed since 9112. Visible
sacrum and SI joints intact. No acute osseous abnormality
identified.

Normal myelographic appearance of the lower thoracic spinal cord
with conus at L1.

T11-T12: Mostly anterior disc bulging. Moderate to severe facet
hypertrophy greater on the left with vacuum facet phenomena. No
stenosis.

T12-L1: Mostly anterior disc bulging. Moderate facet hypertrophy. No
stenosis.

L1-L2: Disc space loss with vacuum disc. Anterior eccentric
circumferential disc osteophyte complex. Moderate to severe facet
hypertrophy greater on the left were there is vacuum facet. Thecal
sac patency appears stable since [REDACTED], no definite stenosis.

L2-L3: Severe disc space loss with vacuum disc. Bulky right and
anterior eccentric circumferential disc osteophyte complex. Mild to
moderate facet hypertrophy. Mild right lateral recess stenosis
appears more conspicuous than on the recent MRI (series 5, image
61). No significant spinal stenosis. Mild left L2 foraminal stenosis
is stable.

L3-L4: Severe disc space loss with vacuum disc and bulky right
eccentric circumferential disc osteophyte complex. Stable mild facet
hypertrophy. Increased conspicuity of right lateral recess stenosis
compared to [DATE], image 78). No significant spinal
stenosis. Mild left and moderate to severe right L3 foraminal
stenosis appears stable.

L4-L5: Disc space loss with vacuum disc. Right eccentric
circumferential disc osteophyte complex. Moderate to severe facet
hypertrophy greater on the right. There is some lateral mass effect
on the thecal sac but no significant spinal stenosis. Mild right
lateral recess stenosis appears stable. There is moderate to severe
left and severe right L4 foraminal stenosis which appears stable.

L5-S1: Disc space loss with mostly anterior disc osteophyte complex.
Moderate facet hypertrophy greater on the left, where left facet
ankylosis is suggested (series 5, image 101). No spinal or lateral
recess stenosis. Severe left and moderate to severe right L5
foraminal stenosis appears stable.

S1 spinal canal and neural foramen are widely patent.
IMPRESSION: LUMBAR MYELOGRAM IMPRESSION:

1. Mild spondylolisthesis at L2-L3, L3-L4, and L5-S1. No abnormal
motion identified with flexion-extension.
2. Chronic disc and endplate degeneration with multilevel mass
effect on the thecal sac.

CT LUMBAR MYELOGRAM IMPRESSION:

1. Due to a fairly capacious spinal canal, and despite widespread
advanced disc and endplate degeneration in the lumbar spine, there
is no significant spinal stenosis.
2. Superimposed intermittent moderate and severe facet degeneration,
including in the lower thoracic spine. Note that at L5-S1 the left
facet is fused/fusing (series 5, image 101).
3. Multifactorial right lateral recess stenosis at L2-L3, L3-L4, and
L4-L5 appears somewhat more pronounced than on the October 2015
MRI.
4. In large part due to the facet disease and degenerative endplate
spurring there is moderate or severe neural foraminal stenosis at
the right L3, bilateral L4 (worse on the right) and bilateral L5
(worse on the left nerve levels. This appears not significantly
changed since [DATE]. Calcified aortic atherosclerosis. Infrarenal IVC filter which
appears to have been remodeled into the wall of the vessel since
placement.

## 2016-10-13 ENCOUNTER — Ambulatory Visit: Payer: Medicare Other | Admitting: Physical Therapy

## 2016-10-13 ENCOUNTER — Encounter: Payer: Self-pay | Admitting: Physical Therapy

## 2016-10-13 DIAGNOSIS — R2681 Unsteadiness on feet: Secondary | ICD-10-CM

## 2016-10-13 DIAGNOSIS — M6281 Muscle weakness (generalized): Secondary | ICD-10-CM

## 2016-10-13 DIAGNOSIS — R262 Difficulty in walking, not elsewhere classified: Secondary | ICD-10-CM
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
## 2016-10-16 ENCOUNTER — Ambulatory Visit: Payer: Medicare Other | Admitting: Physical Therapy

## 2016-10-16 ENCOUNTER — Encounter: Payer: Self-pay | Admitting: Physical Therapy

## 2016-10-16 DIAGNOSIS — M6281 Muscle weakness (generalized): Secondary | ICD-10-CM | POA: Diagnosis not present

## 2016-10-16 DIAGNOSIS — R29898 Other symptoms and signs involving the musculoskeletal system: Secondary | ICD-10-CM

## 2016-10-16 DIAGNOSIS — R2681 Unsteadiness on feet: Secondary | ICD-10-CM

## 2016-10-16 DIAGNOSIS — R262 Difficulty in walking, not elsewhere classified: Secondary | ICD-10-CM
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
## 2016-10-18 ENCOUNTER — Ambulatory Visit: Payer: Medicare Other | Attending: Neurological Surgery | Admitting: Physical Therapy

## 2016-10-18 ENCOUNTER — Encounter: Payer: Self-pay | Admitting: Physical Therapy

## 2016-10-18 DIAGNOSIS — R2681 Unsteadiness on feet: Secondary | ICD-10-CM | POA: Diagnosis not present

## 2016-10-18 DIAGNOSIS — M6281 Muscle weakness (generalized): Secondary | ICD-10-CM | POA: Insufficient documentation

## 2016-10-18 DIAGNOSIS — R262 Difficulty in walking, not elsewhere classified: Secondary | ICD-10-CM | POA: Diagnosis present
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
## 2016-10-23 ENCOUNTER — Ambulatory Visit: Payer: Medicare Other | Admitting: Physical Therapy

## 2016-10-23 ENCOUNTER — Encounter: Payer: Self-pay | Admitting: Physical Therapy

## 2016-10-23 DIAGNOSIS — R2681 Unsteadiness on feet: Secondary | ICD-10-CM | POA: Diagnosis not present

## 2016-10-23 DIAGNOSIS — R262 Difficulty in walking, not elsewhere classified: Secondary | ICD-10-CM

## 2016-10-23 DIAGNOSIS — M6281 Muscle weakness (generalized): Secondary | ICD-10-CM

## 2016-10-23 IMAGING — RF DG C-ARM 61-120 MIN
1 series · 2 of 2 positions shown · non-contrast
Comparison: None.

CLINICAL DATA: Posterior laminectomy and internal fusion of L2
through L5.

EXAM:
DG C-ARM 61-120 MIN

[Series 1: run · 2 of 2 slices shown]
[im 1/2]
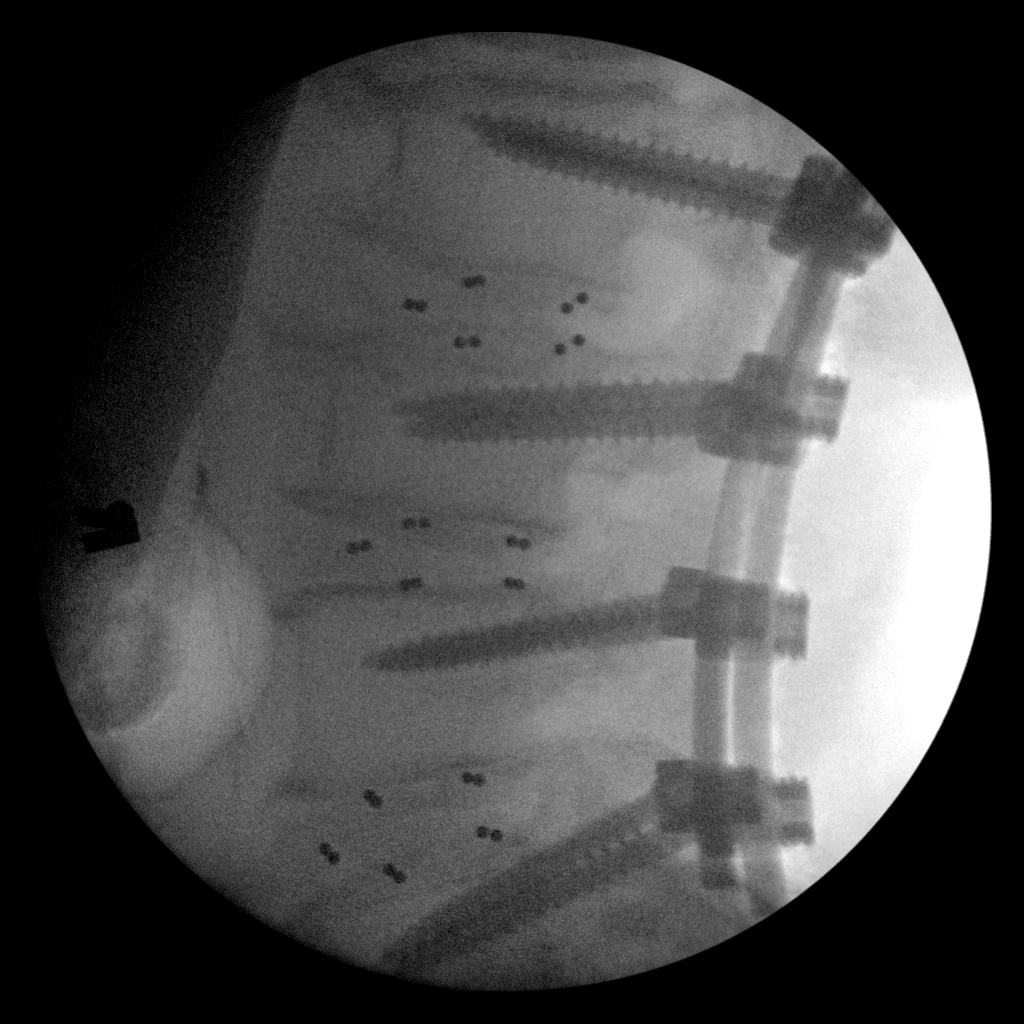
[im 2/2]
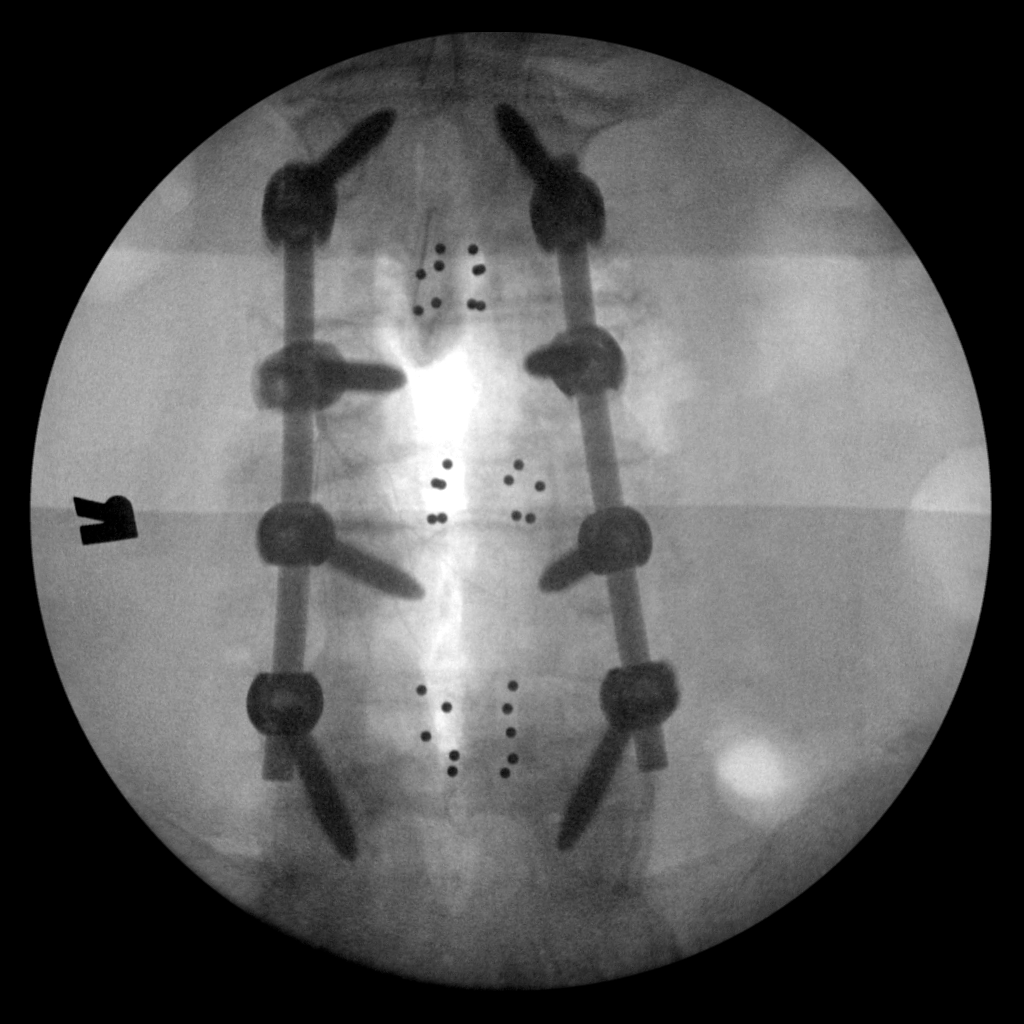

[2 of 2 positions shown; findings below may reference images not displayed]

FINDINGS: Frontal and lateral fluoroscopic spot images the lumbar spine
demonstrate posterolateral rod and pedicle screw fixation at
L2-L3-L4-L5, with interbody spacers. No complicating feature
identified. The IVC filter is a useful marker, anterior to the L3-4
and L4 levels, for the purpose of vertebral numbering in consistency
with the intraoperative radiograph.
IMPRESSION: 1. PLIF at L2-L3-L4-L5, no complicating feature identified.
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
## 2016-10-25 ENCOUNTER — Encounter: Payer: Self-pay | Admitting: Physical Therapy

## 2016-10-25 ENCOUNTER — Ambulatory Visit: Payer: Medicare Other | Admitting: Physical Therapy

## 2016-10-25 DIAGNOSIS — R2681 Unsteadiness on feet: Secondary | ICD-10-CM

## 2016-10-25 DIAGNOSIS — M6281 Muscle weakness (generalized): Secondary | ICD-10-CM

## 2016-10-25 DIAGNOSIS — R262 Difficulty in walking, not elsewhere classified: Secondary | ICD-10-CM
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
## 2016-10-30 ENCOUNTER — Ambulatory Visit: Payer: Medicare Other | Admitting: Physical Therapy

## 2016-10-30 ENCOUNTER — Encounter: Payer: Self-pay | Admitting: Physical Therapy

## 2016-10-30 DIAGNOSIS — R2681 Unsteadiness on feet: Secondary | ICD-10-CM | POA: Diagnosis not present

## 2016-10-30 DIAGNOSIS — R262 Difficulty in walking, not elsewhere classified: Secondary | ICD-10-CM

## 2016-10-30 DIAGNOSIS — M6281 Muscle weakness (generalized): Secondary | ICD-10-CM
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
## 2016-11-01 ENCOUNTER — Encounter: Payer: Self-pay | Admitting: Physical Therapy

## 2016-11-01 ENCOUNTER — Ambulatory Visit: Payer: Medicare Other | Admitting: Physical Therapy

## 2016-11-01 DIAGNOSIS — M6281 Muscle weakness (generalized): Secondary | ICD-10-CM

## 2016-11-01 DIAGNOSIS — R2681 Unsteadiness on feet: Secondary | ICD-10-CM

## 2016-11-01 DIAGNOSIS — R262 Difficulty in walking, not elsewhere classified: Secondary | ICD-10-CM
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
## 2016-11-06 ENCOUNTER — Encounter: Payer: Self-pay | Admitting: Physical Therapy

## 2016-11-06 ENCOUNTER — Ambulatory Visit: Payer: Medicare Other | Admitting: Physical Therapy

## 2016-11-06 DIAGNOSIS — R262 Difficulty in walking, not elsewhere classified: Secondary | ICD-10-CM

## 2016-11-06 DIAGNOSIS — M6281 Muscle weakness (generalized): Secondary | ICD-10-CM

## 2016-11-06 DIAGNOSIS — R2681 Unsteadiness on feet: Secondary | ICD-10-CM | POA: Diagnosis not present
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
## 2016-11-06 NOTE — Patient Instructions (Signed)
Bilateral Front Arm Raise    Standing in neutral posture, on floor, raise both arms to shoulder height in front. Hold _30___ seconds. , __3__ sets. .  Copyright  VHI. All rights reserved.  Bilateral Side Arm Raise    Standing in neutral posture, on floor, sweep arms out from sides and up to shoulder height. Hold _30___ seconds. , __1__ sets.   Copyright  VHI. All rights reserved.  Roller: Two-Leg Stand (Frontal) - Arms at [employer], arms at sides, feet perpendicular to length  Hold each of the checked options __60__ seconds:  Eyes closed Head turned to one side, then other Do __3__ repetitions, __1__ sets.  Heel Raise: Bilateral (Standing)    Rise on balls of feet. Repeat __15__ times per set. Do __1__ sets per session. Do __1__ sessions per day.  http://orth.exer.us/38   Copyright  VHI. All rights reserved.  Band Walk: Side Stepping     Step _8__ feet to one side, then step back to start. Repeat 2 times 1 time per day.    http://plyo.exer.us/76   Copyright  VHI. All rights reserved.  [facility] [address], [place], [place] [address] Phone # [phone] Fax [phone]

## 2016-11-14 ENCOUNTER — Ambulatory Visit
Admission: RE | Admit: 2016-11-14 | Discharge: 2016-11-14 | Disposition: A | Discharge status: Home / Self Care | Payer: Medicare Other | Source: Ambulatory Visit | Attending: Neurological Surgery | Admitting: Neurological Surgery

## 2016-11-14 DIAGNOSIS — M21371 Foot drop, right foot: Secondary | ICD-10-CM

## 2016-11-28 ENCOUNTER — Other Ambulatory Visit (HOSPITAL_COMMUNITY): Payer: Self-pay | Admitting: Neurological Surgery

## 2016-11-28 ENCOUNTER — Other Ambulatory Visit: Payer: Self-pay | Admitting: Neurological Surgery

## 2016-11-28 DIAGNOSIS — M47816 Spondylosis without myelopathy or radiculopathy, lumbar region: Secondary | ICD-10-CM

## 2016-12-06 ENCOUNTER — Ambulatory Visit (HOSPITAL_COMMUNITY)
Admission: RE | Admit: 2016-12-06 | Discharge: 2016-12-06 | Disposition: A | Discharge status: Home / Self Care | Payer: Medicare Other | Source: Ambulatory Visit | Attending: Neurological Surgery | Admitting: Neurological Surgery

## 2016-12-06 DIAGNOSIS — M47816 Spondylosis without myelopathy or radiculopathy, lumbar region: Secondary | ICD-10-CM | POA: Diagnosis present

## 2016-12-06 DIAGNOSIS — M5137 Other intervertebral disc degeneration, lumbosacral region: Secondary | ICD-10-CM | POA: Insufficient documentation

## 2016-12-06 DIAGNOSIS — Z981 Arthrodesis status: Secondary | ICD-10-CM | POA: Insufficient documentation

## 2016-12-06 DIAGNOSIS — M4726 Other spondylosis with radiculopathy, lumbar region: Secondary | ICD-10-CM | POA: Insufficient documentation

## 2016-12-06 DIAGNOSIS — M48061 Spinal stenosis, lumbar region without neurogenic claudication: Secondary | ICD-10-CM | POA: Diagnosis not present

## 2016-12-06 DIAGNOSIS — R938 Abnormal findings on diagnostic imaging of other specified body structures: Secondary | ICD-10-CM | POA: Diagnosis not present

## 2016-12-06 MED ORDER — DIAZEPAM 5 MG PO TABS
ORAL_TABLET | ORAL | Status: AC
  Administered 2016-12-06: 10 mg via ORAL
  Filled 2016-12-06: qty 2

## 2016-12-06 MED ORDER — LIDOCAINE HCL (PF) 1 % IJ SOLN
5.0000 mL | Freq: Once | INTRAMUSCULAR | Status: AC
  Administered 2016-12-06: 5 mL via INTRADERMAL

## 2016-12-06 MED ORDER — DIAZEPAM 5 MG PO TABS
10.0000 mg | ORAL_TABLET | Freq: Once | ORAL | Status: AC
  Administered 2016-12-06: 10 mg via ORAL
  Filled 2016-12-06: qty 2

## 2016-12-06 MED ORDER — IOPAMIDOL (ISOVUE-M 200) INJECTION 41%
20.0000 mL | Freq: Once | INTRAMUSCULAR | Status: AC
  Administered 2016-12-06: 16 mL via INTRATHECAL

## 2016-12-06 MED ORDER — OXYCODONE-ACETAMINOPHEN 5-325 MG PO TABS: 1.0000 | ORAL_TABLET | ORAL | Status: DC | PRN

## 2016-12-06 MED ORDER — LIDOCAINE HCL 1 % IJ SOLN
INTRAMUSCULAR | Status: AC
  Filled 2016-12-06: qty 10

## 2016-12-06 MED ORDER — IOPAMIDOL (ISOVUE-M 200) INJECTION 41%
INTRAMUSCULAR | Status: AC
  Administered 2016-12-06: 16 mL via INTRATHECAL
  Filled 2016-12-06: qty 10

## 2016-12-06 NOTE — Procedures (Signed)
[name] is a 80 year old individual is had significant back and left lower extremity pain he has had surgical decompression and fusion from L2-L5 little over a year ago and this did seem to improve the situation however he has had persistence of left lumbar radicular pain radiating down to his toes because of the presence of all of metal in his back have advised a myelogram and post myelogram CAT scan is now admitted for that procedure. His motor function has been intact save for some weakness proximally in iliopsoas and quadriceps.  Pre op Dx: Lumbar radiculopathy Post op Dx: Lumbar radiculopathy Procedure: Lumbar myelogram Surgeon: Mikeisha Lemonds Puncture level: L3-4 Fluid color: Clear, colorless Injection: Isovue 200, 16 mL Findings: Slight lateral recess stenosis L5-S1, further evaluation with CAT scan in

## 2016-12-06 NOTE — Discharge Instructions (Signed)
Myelogram and Lumbar Puncture Discharge Instructions ° °1. Go home and rest quietly for the next 24 hours.  It is important to lie flat for the next 24 hours.  Get up only to go to the restroom.  You may lie in the bed or on a couch on your back, your stomach, your left side or your right side.  You may have one pillow under your head.  You may have pillows between your knees while you are on your side or under your knees while you are on your back. ° °2. DO NOT drive today.  Recline the seat as far back as it will go, while still wearing your seat belt, on the way home. ° °3. You may get up to go to the bathroom as needed.  You may sit up for 10 minutes to eat.  You may resume your normal diet and medications unless otherwise indicated. ° °4. The incidence of headache, nausea, or vomiting is about 5% (one in 20 patients).  If you develop a headache, lie flat and drink plenty of fluids until the headache goes away.  Caffeinated beverages may be helpful.  If you develop severe nausea and vomiting or a headache that does not go away with flat bed rest, call [phone]. ° °5. You may resume normal activities after your 24 hours of bed rest is over; however, do not exert yourself strongly or do any heavy lifting tomorrow. ° °6. Call your physician for a follow-up appointment.  The results of your myelogram will be sent directly to your physician by the following day. ° °7. If you have any questions or if complications develop after you arrive home, please call [phone]. ° °Discharge instructions have been explained to the patient.  The patient, or the person responsible for the patient, fully understands these instructions. ° ° °

## 2016-12-15 DIAGNOSIS — Z87891 Personal history of nicotine dependence: Secondary | ICD-10-CM | POA: Insufficient documentation

## 2016-12-15 DIAGNOSIS — Z89011 Acquired absence of right thumb: Secondary | ICD-10-CM | POA: Insufficient documentation

## 2016-12-15 DIAGNOSIS — Z8639 Personal history of other endocrine, nutritional and metabolic disease: Secondary | ICD-10-CM | POA: Insufficient documentation

## 2016-12-15 DIAGNOSIS — M2242 Chondromalacia patellae, left knee: Secondary | ICD-10-CM | POA: Insufficient documentation

## 2016-12-15 DIAGNOSIS — Z8551 Personal history of malignant neoplasm of bladder: Secondary | ICD-10-CM | POA: Insufficient documentation

## 2016-12-15 DIAGNOSIS — M170 Bilateral primary osteoarthritis of knee: Secondary | ICD-10-CM | POA: Insufficient documentation

## 2016-12-15 DIAGNOSIS — Z8546 Personal history of malignant neoplasm of prostate: Secondary | ICD-10-CM | POA: Insufficient documentation

## 2016-12-15 DIAGNOSIS — Z8719 Personal history of other diseases of the digestive system: Secondary | ICD-10-CM | POA: Insufficient documentation

## 2016-12-15 DIAGNOSIS — M2241 Chondromalacia patellae, right knee: Secondary | ICD-10-CM | POA: Insufficient documentation
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
## 2016-12-19 DIAGNOSIS — Z96612 Presence of left artificial shoulder joint: Secondary | ICD-10-CM | POA: Insufficient documentation

## 2016-12-20 ENCOUNTER — Encounter: Payer: Self-pay | Admitting: Rheumatology

## 2016-12-20 ENCOUNTER — Ambulatory Visit (INDEPENDENT_AMBULATORY_CARE_PROVIDER_SITE_OTHER): Payer: Medicare Other | Admitting: Rheumatology

## 2016-12-20 VITALS — BP 126/68 | HR 79 | Resp 13 | Ht 74.0 in | Wt 266.0 lb

## 2016-12-20 DIAGNOSIS — M2241 Chondromalacia patellae, right knee: Secondary | ICD-10-CM | POA: Diagnosis not present

## 2016-12-20 DIAGNOSIS — Z8639 Personal history of other endocrine, nutritional and metabolic disease: Secondary | ICD-10-CM

## 2016-12-20 DIAGNOSIS — Z8551 Personal history of malignant neoplasm of bladder: Secondary | ICD-10-CM

## 2016-12-20 DIAGNOSIS — Z8719 Personal history of other diseases of the digestive system: Secondary | ICD-10-CM | POA: Diagnosis not present

## 2016-12-20 DIAGNOSIS — Z96612 Presence of left artificial shoulder joint: Secondary | ICD-10-CM

## 2016-12-20 DIAGNOSIS — M47816 Spondylosis without myelopathy or radiculopathy, lumbar region: Secondary | ICD-10-CM

## 2016-12-20 DIAGNOSIS — Z87891 Personal history of nicotine dependence: Secondary | ICD-10-CM

## 2016-12-20 DIAGNOSIS — Z89011 Acquired absence of right thumb: Secondary | ICD-10-CM

## 2016-12-20 DIAGNOSIS — M2242 Chondromalacia patellae, left knee: Secondary | ICD-10-CM

## 2016-12-20 DIAGNOSIS — Z8546 Personal history of malignant neoplasm of prostate: Secondary | ICD-10-CM

## 2016-12-20 DIAGNOSIS — M170 Bilateral primary osteoarthritis of knee: Secondary | ICD-10-CM | POA: Diagnosis not present
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
## 2017-01-31 NOTE — Progress Notes (Signed)
[facility] [employer] PREP Weekly Session   Patient Details  Name: [name] MRN: [id number] Date of Birth: March 11, 1936 Age: 81 y.o. PCP: [name], MD  Vitals:   01/31/17 1125  Weight: 260 lb (117.9 kg)        [facility] Weekly seesion - 01/31/17 1100      Weekly Session   Topic Discussed Other  guest speaker   Classes attended to date 1       [name] 01/31/2017, 11:26 AM

## 2017-02-09 NOTE — Progress Notes (Signed)
[facility] PREP Weekly Session   Patient Details  Name: [name] MRN: [id number] Date of Birth: [birth year] Age: 81 y.o. PCP: [name], MD  Vitals:   02/09/17 1016  Weight: 260 lb (117.9 kg)        [facility] [employer] Weekly seesion - 02/09/17 1000      Weekly Session   Topic Discussed Other ways to be active   Classes attended to date 2       [name] 02/09/2017, 10:16 AM

## 2017-02-16 NOTE — Progress Notes (Signed)
[facility] PREP Weekly Session   Patient Details  Name: [name] MRN: [id number] Date of Birth: 1936/09/22 Age: 81 y.o. PCP: [name], MD  Vitals:   02/16/17 [id number]  Weight: 260 lb (117.9 kg)        [facility] [employer] Weekly seesion - 02/16/17 0900      Weekly Session   Topic Discussed Healthy eating tips   Minutes exercised this week 60 minutes  30 cardio/30 strength   Classes attended to date 3     Fun things you did since last meeting:"no rain"  [name] 02/16/2017, 9:59 AM

## 2017-02-23 NOTE — Progress Notes (Signed)
[facility] [employer] PREP Weekly Session   Patient Details  Name: [name] MRN: [id number] Date of Birth: [birth year] Age: 81 y.o. PCP: [name], MD  Vitals:   02/23/17 1105  Weight: 260 lb (117.9 kg)        [facility] Weekly seesion - 02/23/17 1100      Weekly Session   Topic Discussed Restaurant Eating   Minutes exercised this week 90 minutes  cardio   Classes attended to date 4     Things you are grateful for:"being above ground"  [name] 02/23/2017, 11:06 AM

## 2017-02-28 NOTE — Progress Notes (Signed)
[facility] PREP Weekly Session   Patient Details  Name: [name] MRN: [id number] Date of Birth: 06-Oct-1936 Age: 81 y.o. PCP: [name], MD  Vitals:   02/28/17 1334  Weight: 255 lb (115.7 kg)        [facility] Weekly seesion - 02/28/17 1300      Weekly Session   Topic Discussed Stress management and problem solving   Minutes exercised this week 60 minutes  30cardio/30strength   Classes attended to date [place] 02/28/2017, 1:34 PM

## 2017-03-09 NOTE — Progress Notes (Signed)
[facility] PREP Weekly Session   Patient Details  Name: [name] MRN: [id number] Date of Birth: 1936-12-02 Age: 81 y.o. PCP: [name], MD  Vitals:   03/09/17 1131  Weight: 255 lb (115.7 kg)        [facility] Weekly seesion - 03/09/17 1100      Weekly Session   Topic Discussed Importance of resistance training   Minutes exercised this week 70 minutes  40cardio/30 strength   Classes attended to date 6       [name] 03/09/2017, 11:32 AM

## 2017-03-16 NOTE — Progress Notes (Signed)
[facility] PREP Weekly Session   Patient Details  Name: [name] MRN: [id number] Date of Birth: 28-Nov-1936 Age: 81 y.o. PCP: [name], MD  Vitals:   03/16/17 1414  Weight: 250 lb (113.4 kg)        [facility] Weekly seesion - 03/16/17 1400      Weekly Session   Topic Discussed Expectations and non-scale victories   Minutes exercised this week 180 minutes  120cardio/60strength   Classes attended to date [place] 03/16/2017, 2:15 PM

## 2017-03-21 NOTE — Progress Notes (Signed)
[facility] PREP Weekly Session   Patient Details  Name: [name] MRN: [id number] Date of Birth: May 16, 1936 Age: 81 y.o. PCP: [name], MD  Vitals:   03/21/17 1400  Weight: 250 lb (113.4 kg)        [facility] [employer] Weekly seesion - 03/21/17 1400      Weekly Session   Topic Discussed Other  portion control   Minutes exercised this week 90 minutes  60cardio/30strength   Classes attended to date [place] 03/21/2017, 2:09 PM

## 2017-03-29 ENCOUNTER — Encounter: Payer: Self-pay | Admitting: Physical Therapy

## 2017-03-29 ENCOUNTER — Ambulatory Visit: Payer: Medicare Other | Attending: Neurological Surgery | Admitting: Physical Therapy

## 2017-03-29 DIAGNOSIS — R2681 Unsteadiness on feet: Secondary | ICD-10-CM | POA: Diagnosis present

## 2017-03-29 DIAGNOSIS — R29898 Other symptoms and signs involving the musculoskeletal system: Secondary | ICD-10-CM | POA: Diagnosis present

## 2017-03-29 DIAGNOSIS — M6281 Muscle weakness (generalized): Secondary | ICD-10-CM | POA: Insufficient documentation

## 2017-03-29 DIAGNOSIS — R262 Difficulty in walking, not elsewhere classified: Secondary | ICD-10-CM | POA: Diagnosis not present
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
## 2017-03-30 NOTE — Progress Notes (Addendum)
[facility] PREP Weekly Session   Patient Details  Name: [name] MRN: [id number] Date of Birth: August 01, 1936 Age: 81 y.o. PCP: [name], MD  Vitals:   03/30/17 1200  Weight: 255 lb (115.7 kg)        [facility] Weekly seesion - 03/30/17 0900      Weekly Session   Topic Discussed Finding support   Minutes exercised this week 90 minutes  60cardio/30strength   Classes attended to date 9   Comments --       [name] 03/30/2017, 12:11 PM

## 2017-04-02 ENCOUNTER — Encounter: Payer: Self-pay | Admitting: Internal Medicine

## 2017-04-02 ENCOUNTER — Ambulatory Visit: Payer: Medicare Other | Admitting: Physical Therapy

## 2017-04-02 ENCOUNTER — Encounter: Payer: Self-pay | Admitting: Physical Therapy

## 2017-04-02 DIAGNOSIS — M6281 Muscle weakness (generalized): Secondary | ICD-10-CM

## 2017-04-02 DIAGNOSIS — R262 Difficulty in walking, not elsewhere classified: Secondary | ICD-10-CM | POA: Diagnosis not present

## 2017-04-02 DIAGNOSIS — R29898 Other symptoms and signs involving the musculoskeletal system: Secondary | ICD-10-CM
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
## 2017-04-02 NOTE — Patient Instructions (Addendum)
Chair Sitting    Sit at edge of seat, spine straight, one leg extended. Put a hand on each thigh and bend forward from the hip, keeping spine straight. Allow hand on extended leg to reach toward toes. Support upper body with other arm. Hold _30__ seconds. Repeat __2_ times per session. Do _1__ sessions per day.  Copyright  VHI. All rights reserved.   Gastroc / Heel Cord Stretch - On Step    Stand with heels over edge of stair. Holding rail, lower heels until stretch is felt in calf of legs. Hold 30 sec. Repeat _2__ times. Do _1__ times per day.  Copyright  VHI. All rights reserved.    [facility] [address], [place], [place] [address] Phone # [phone] Fax [phone]

## 2017-04-04 ENCOUNTER — Encounter: Payer: Medicare Other | Admitting: Physical Therapy

## 2017-04-05 ENCOUNTER — Encounter: Payer: Self-pay | Admitting: Physical Therapy

## 2017-04-05 ENCOUNTER — Ambulatory Visit: Payer: Medicare Other | Admitting: Physical Therapy

## 2017-04-05 DIAGNOSIS — R262 Difficulty in walking, not elsewhere classified: Secondary | ICD-10-CM | POA: Diagnosis not present

## 2017-04-05 DIAGNOSIS — M6281 Muscle weakness (generalized): Secondary | ICD-10-CM

## 2017-04-05 DIAGNOSIS — R29898 Other symptoms and signs involving the musculoskeletal system: Secondary | ICD-10-CM

## 2017-04-05 DIAGNOSIS — R2681 Unsteadiness on feet: Secondary | ICD-10-CM
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
## 2017-04-06 NOTE — Progress Notes (Signed)
[facility] [employer] PREP Weekly Session   Patient Details  Name: [name] MRN: [id number] Date of Birth: 1936-10-14 Age: 81 y.o. PCP: [name], MD  Vitals:   04/06/17 1235  Weight: 255 lb (115.7 kg)        [facility] [employer] Weekly seesion - 04/06/17 1200      Weekly Session   Topic Discussed Calorie breakdown   Minutes exercised this week 90 minutes  cardio   Classes attended to date [place] 04/06/2017, 12:36 PM

## 2017-04-09 ENCOUNTER — Ambulatory Visit: Payer: Medicare Other | Admitting: Physical Therapy

## 2017-04-09 ENCOUNTER — Encounter: Payer: Self-pay | Admitting: Physical Therapy

## 2017-04-09 DIAGNOSIS — R262 Difficulty in walking, not elsewhere classified: Secondary | ICD-10-CM | POA: Diagnosis not present

## 2017-04-09 DIAGNOSIS — R2681 Unsteadiness on feet: Secondary | ICD-10-CM

## 2017-04-09 DIAGNOSIS — R29898 Other symptoms and signs involving the musculoskeletal system: Secondary | ICD-10-CM

## 2017-04-09 DIAGNOSIS — M6281 Muscle weakness (generalized): Secondary | ICD-10-CM

## 2017-04-11 ENCOUNTER — Encounter: Payer: Self-pay | Admitting: Physical Therapy

## 2017-04-11 ENCOUNTER — Encounter: Payer: Medicare Other | Admitting: Physical Therapy
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
## 2017-04-11 NOTE — Progress Notes (Signed)
[facility] [employer] PREP Weekly Session   Patient Details  Name: [name] MRN: [id number] Date of Birth: April 04, 1936 Age: 81 y.o. PCP: [name], MD  Vitals:   04/11/17 1336  Weight: 255 lb (115.7 kg)        [facility] [employer] Weekly seesion - 04/11/17 1300      Weekly Session   Topic Discussed (P)  Hitting roadblocks   Minutes exercised this week (P)  90 minutes  cardio   Classes attended to date (P)  11       [name] 04/11/2017, 1:37 PM

## 2017-04-12 ENCOUNTER — Ambulatory Visit: Payer: Medicare Other | Admitting: Physical Therapy

## 2017-04-12 ENCOUNTER — Encounter: Payer: Self-pay | Admitting: Physical Therapy

## 2017-04-12 DIAGNOSIS — R262 Difficulty in walking, not elsewhere classified: Secondary | ICD-10-CM | POA: Diagnosis not present

## 2017-04-12 DIAGNOSIS — R2681 Unsteadiness on feet: Secondary | ICD-10-CM

## 2017-04-12 DIAGNOSIS — R29898 Other symptoms and signs involving the musculoskeletal system: Secondary | ICD-10-CM

## 2017-04-12 DIAGNOSIS — M6281 Muscle weakness (generalized): Secondary | ICD-10-CM
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
## 2017-04-16 ENCOUNTER — Ambulatory Visit: Payer: Medicare Other | Admitting: Physical Therapy

## 2017-04-16 ENCOUNTER — Encounter: Payer: Self-pay | Admitting: Physical Therapy

## 2017-04-16 DIAGNOSIS — R262 Difficulty in walking, not elsewhere classified: Secondary | ICD-10-CM | POA: Diagnosis not present

## 2017-04-16 DIAGNOSIS — R2681 Unsteadiness on feet: Secondary | ICD-10-CM

## 2017-04-16 DIAGNOSIS — R29898 Other symptoms and signs involving the musculoskeletal system: Secondary | ICD-10-CM

## 2017-04-16 DIAGNOSIS — M6281 Muscle weakness (generalized): Secondary | ICD-10-CM
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
## 2017-04-18 ENCOUNTER — Encounter: Payer: Medicare Other | Admitting: Physical Therapy

## 2017-04-19 ENCOUNTER — Ambulatory Visit: Payer: Medicare Other | Attending: Neurological Surgery | Admitting: Physical Therapy

## 2017-04-19 ENCOUNTER — Encounter: Payer: Self-pay | Admitting: Physical Therapy

## 2017-04-19 DIAGNOSIS — R2681 Unsteadiness on feet: Secondary | ICD-10-CM | POA: Diagnosis present

## 2017-04-19 DIAGNOSIS — M6281 Muscle weakness (generalized): Secondary | ICD-10-CM | POA: Diagnosis present

## 2017-04-19 DIAGNOSIS — R262 Difficulty in walking, not elsewhere classified: Secondary | ICD-10-CM | POA: Diagnosis present

## 2017-04-19 DIAGNOSIS — R29898 Other symptoms and signs involving the musculoskeletal system: Secondary | ICD-10-CM | POA: Diagnosis present

## 2017-04-19 NOTE — Patient Instructions (Signed)
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
## 2017-04-27 NOTE — Progress Notes (Signed)
[facility] [employer] PRE[facility] Weekly Session   Patient Details  Name: [name] MRN: [id number] Date of Birth: 1936-05-01 Age: 81 y.o. PCP: Shon Baton, MD  Vitals:   04/27/17 0909  Weight: 255 lb (115.7 kg)        [facility] [employer] Weekly seesion - 04/27/17 0900      Weekly Session   Topic Discussed Other  fat calories   Minutes exercised this week 90 minutes  cardio   Classes attended to date 12       Santiana Glidden 04/27/2017, 9:10 AM

## 2017-05-02 ENCOUNTER — Encounter: Payer: Medicare Other | Admitting: Physical Therapy

## 2017-05-03 ENCOUNTER — Ambulatory Visit: Payer: Medicare Other | Admitting: Physical Therapy

## 2017-05-03 ENCOUNTER — Encounter: Payer: Self-pay | Admitting: Physical Therapy

## 2017-05-03 DIAGNOSIS — R29898 Other symptoms and signs involving the musculoskeletal system: Secondary | ICD-10-CM

## 2017-05-03 DIAGNOSIS — R262 Difficulty in walking, not elsewhere classified: Secondary | ICD-10-CM | POA: Diagnosis not present

## 2017-05-03 DIAGNOSIS — M6281 Muscle weakness (generalized): Secondary | ICD-10-CM
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
## 2017-05-04 ENCOUNTER — Ambulatory Visit: Payer: Medicare Other | Admitting: Physical Therapy

## 2017-05-04 ENCOUNTER — Encounter: Payer: Self-pay | Admitting: Physical Therapy

## 2017-05-04 DIAGNOSIS — R29898 Other symptoms and signs involving the musculoskeletal system: Secondary | ICD-10-CM

## 2017-05-04 DIAGNOSIS — R262 Difficulty in walking, not elsewhere classified: Secondary | ICD-10-CM | POA: Diagnosis not present

## 2017-05-04 DIAGNOSIS — R2681 Unsteadiness on feet: Secondary | ICD-10-CM

## 2017-05-04 DIAGNOSIS — M6281 Muscle weakness (generalized): Secondary | ICD-10-CM
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
## 2017-05-07 ENCOUNTER — Encounter: Payer: Self-pay | Admitting: Physical Therapy

## 2017-05-07 ENCOUNTER — Ambulatory Visit: Payer: Medicare Other | Admitting: Physical Therapy

## 2017-05-07 DIAGNOSIS — R262 Difficulty in walking, not elsewhere classified: Secondary | ICD-10-CM | POA: Diagnosis not present

## 2017-05-07 DIAGNOSIS — M6281 Muscle weakness (generalized): Secondary | ICD-10-CM
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
## 2017-05-09 ENCOUNTER — Encounter: Payer: Medicare Other | Admitting: Physical Therapy

## 2017-05-10 ENCOUNTER — Ambulatory Visit: Payer: Medicare Other | Admitting: Physical Therapy

## 2017-05-10 ENCOUNTER — Telehealth: Payer: Self-pay | Admitting: Physical Therapy

## 2017-05-10 NOTE — Telephone Encounter (Signed)
Left message due to patient no show to his appointment on 05/10/2017 at [place], PT @5 /24/2018@ 11:41 AM

## 2017-05-11 ENCOUNTER — Ambulatory Visit: Payer: Medicare Other | Admitting: Physical Therapy

## 2017-05-11 ENCOUNTER — Encounter: Payer: Self-pay | Admitting: Physical Therapy

## 2017-05-11 DIAGNOSIS — R262 Difficulty in walking, not elsewhere classified: Secondary | ICD-10-CM | POA: Diagnosis not present

## 2017-05-11 DIAGNOSIS — R29898 Other symptoms and signs involving the musculoskeletal system: Secondary | ICD-10-CM

## 2017-05-11 DIAGNOSIS — M6281 Muscle weakness (generalized): Secondary | ICD-10-CM

## 2017-05-11 DIAGNOSIS — R2681 Unsteadiness on feet: Secondary | ICD-10-CM
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
## 2017-05-16 ENCOUNTER — Ambulatory Visit: Payer: Medicare Other | Admitting: Physical Therapy

## 2017-05-16 ENCOUNTER — Encounter: Payer: Self-pay | Admitting: Physical Therapy

## 2017-05-16 DIAGNOSIS — R262 Difficulty in walking, not elsewhere classified: Secondary | ICD-10-CM

## 2017-05-16 DIAGNOSIS — M6281 Muscle weakness (generalized): Secondary | ICD-10-CM

## 2017-05-16 DIAGNOSIS — R29898 Other symptoms and signs involving the musculoskeletal system: Secondary | ICD-10-CM

## 2017-05-16 DIAGNOSIS — R2681 Unsteadiness on feet: Secondary | ICD-10-CM

## 2017-05-16 NOTE — Progress Notes (Signed)
[facility] PRE[facility] Weekly Session   Patient Details  Name: [name] MRN: [id number] Date of Birth: 08-08-1936 Age: 81 y.o. PCP: Shon Baton, MD  Vitals:   05/16/17 1136  Weight: 260 lb (117.9 kg)        [facility] [employer] Weekly seesion - 05/16/17 1100      Weekly Session   Topic Discussed Health habits   Classes attended to date 34     Fun things you did since last meeting:"went on a cruise"   [name] 05/16/2017, 11:36 AM
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
## 2017-05-18 ENCOUNTER — Ambulatory Visit: Payer: Medicare Other | Attending: Neurological Surgery | Admitting: Physical Therapy

## 2017-05-18 DIAGNOSIS — R262 Difficulty in walking, not elsewhere classified: Secondary | ICD-10-CM | POA: Diagnosis not present

## 2017-05-18 DIAGNOSIS — M6281 Muscle weakness (generalized): Secondary | ICD-10-CM

## 2017-05-18 DIAGNOSIS — R2681 Unsteadiness on feet: Secondary | ICD-10-CM

## 2017-05-18 DIAGNOSIS — R29898 Other symptoms and signs involving the musculoskeletal system: Secondary | ICD-10-CM | POA: Diagnosis present
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
## 2017-05-21 ENCOUNTER — Encounter: Payer: Self-pay | Admitting: Physical Therapy

## 2017-05-21 ENCOUNTER — Ambulatory Visit: Payer: Medicare Other | Admitting: Physical Therapy

## 2017-05-21 DIAGNOSIS — R2681 Unsteadiness on feet: Secondary | ICD-10-CM

## 2017-05-21 DIAGNOSIS — M6281 Muscle weakness (generalized): Secondary | ICD-10-CM

## 2017-05-21 DIAGNOSIS — R262 Difficulty in walking, not elsewhere classified: Secondary | ICD-10-CM | POA: Diagnosis not present

## 2017-05-21 DIAGNOSIS — R29898 Other symptoms and signs involving the musculoskeletal system: Secondary | ICD-10-CM
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
## 2017-05-23 ENCOUNTER — Ambulatory Visit: Payer: Medicare Other

## 2017-05-23 ENCOUNTER — Encounter: Payer: Self-pay | Admitting: Internal Medicine

## 2017-05-23 DIAGNOSIS — R262 Difficulty in walking, not elsewhere classified: Secondary | ICD-10-CM | POA: Diagnosis not present

## 2017-05-23 DIAGNOSIS — R2681 Unsteadiness on feet: Secondary | ICD-10-CM

## 2017-05-23 DIAGNOSIS — R29898 Other symptoms and signs involving the musculoskeletal system: Secondary | ICD-10-CM

## 2017-05-23 DIAGNOSIS — M6281 Muscle weakness (generalized): Secondary | ICD-10-CM
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
## 2017-05-28 ENCOUNTER — Telehealth: Payer: Self-pay

## 2017-05-28 NOTE — Telephone Encounter (Signed)
Spoke w/Mr. Wank about coming in to complete the post PREP assessment.  He plans to be here on Wed 05/30/17 to complete that.

## 2017-06-05 IMAGING — RF DG MYELOGRAM LUMBAR
11 series · 11 of 11 positions shown · non-contrast
Comparison: Lumbar spine CT myelogram 04/13/2016

CLINICAL DATA: Left buttock/hip pain which radiates down the
lateral aspect of the left leg. Prior lumbar fusion.
TECHNIQUE: Contiguous axial images were obtained through the Lumbar spine after
the intrathecal infusion of infusion. Coronal and sagittal
reconstructions were obtained of the axial image sets.

[Series 1: cp_standard · 0.27mm/px · 1 of 1 slices shown (1 of 2)]
[im 1/1]
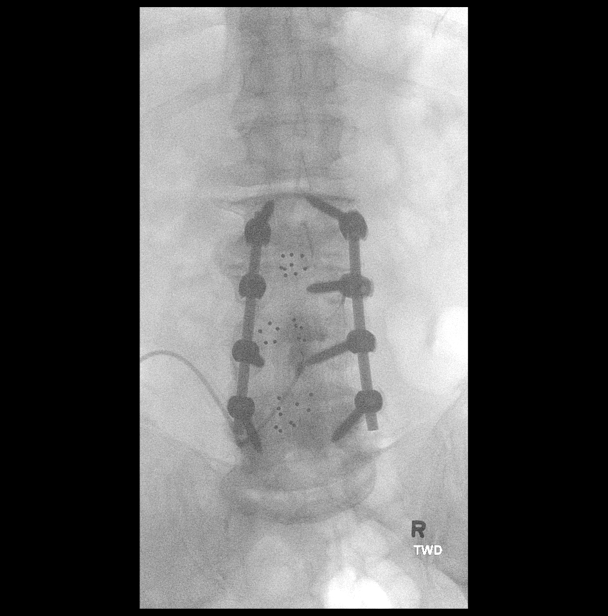

[Series 2: cp_standard · 0.27mm/px · 1 of 1 slices shown (2 of 2)]
[im 1/1]
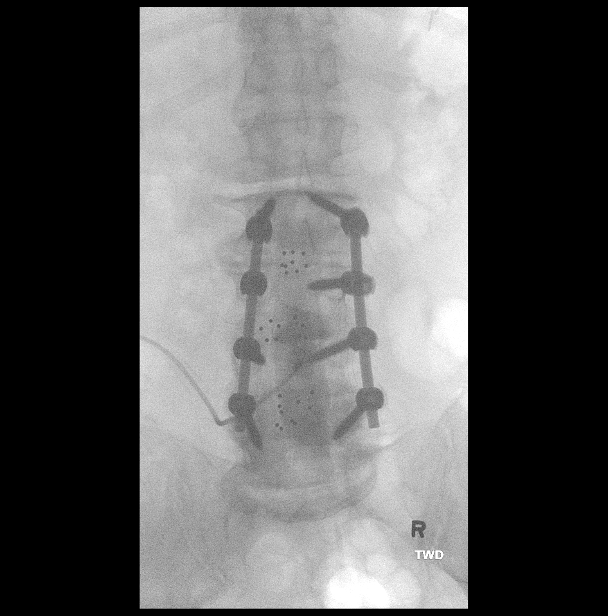

[Series 3: fluoro_myelogram_singleshot_bw · 0.18mm/px · 1 of 1 slices shown (1 of 9)]
[im 1/1]
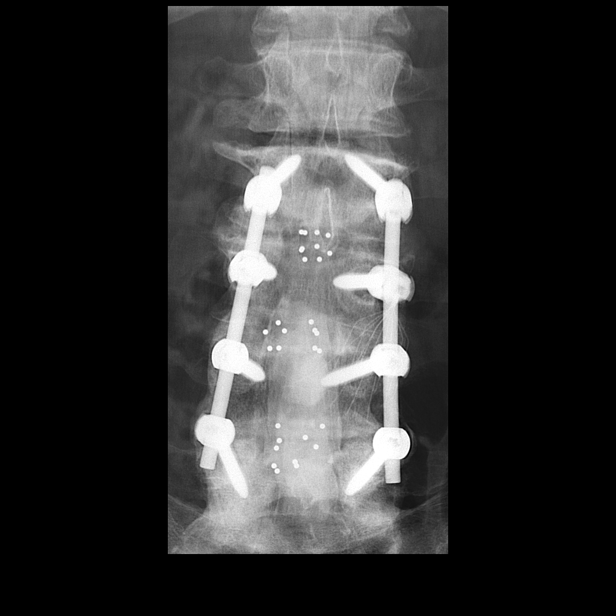

[Series 4: fluoro_myelogram_singleshot_bw · 0.18mm/px · 1 of 1 slices shown (2 of 9)]
[im 1/1]
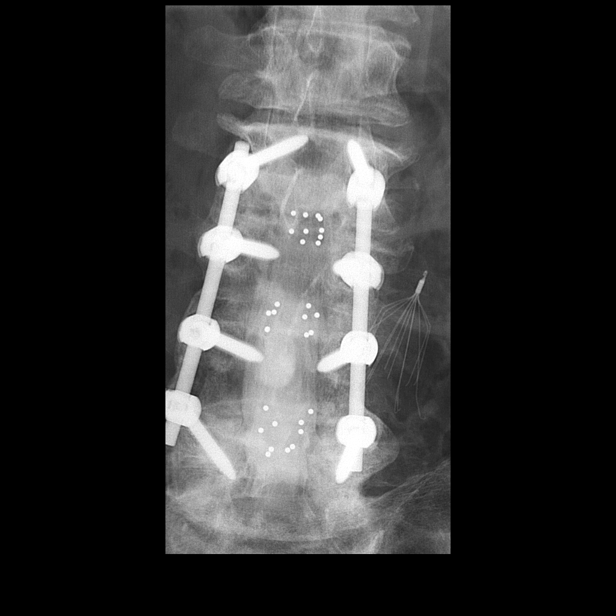

[Series 5: fluoro_myelogram_singleshot_bw · 0.18mm/px · 1 of 1 slices shown (3 of 9)]
[im 1/1]
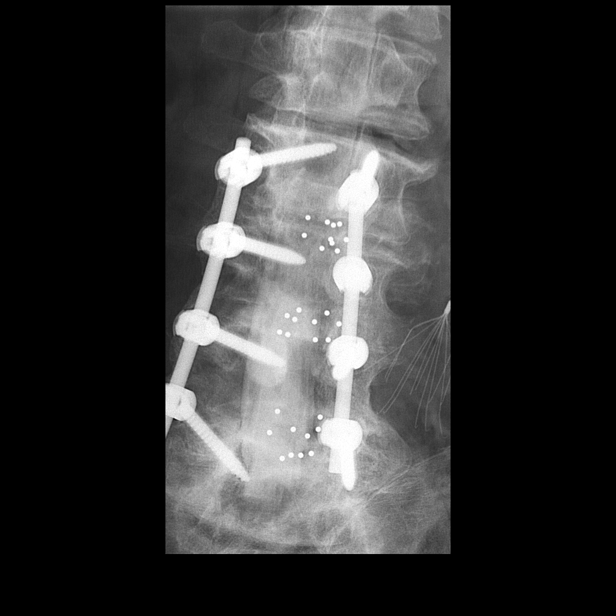

[Series 6: fluoro_myelogram_singleshot_bw · 0.18mm/px · 1 of 1 slices shown (4 of 9)]
[im 1/1]
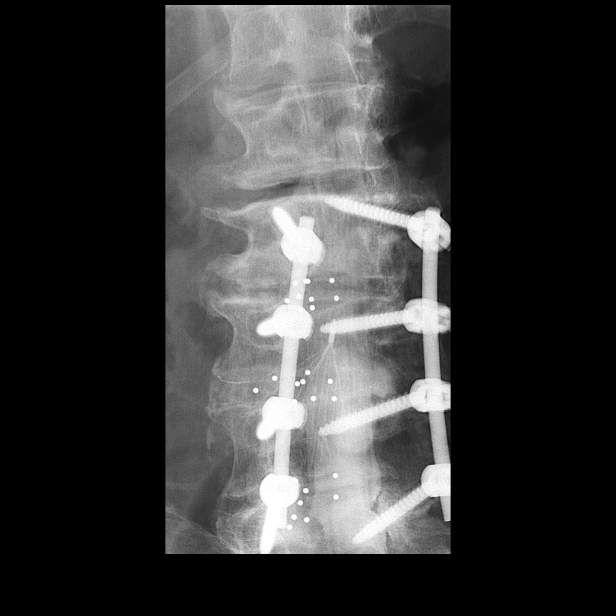

[Series 7: fluoro_myelogram_singleshot_bw · 0.18mm/px · 1 of 1 slices shown (5 of 9)]
[im 1/1]
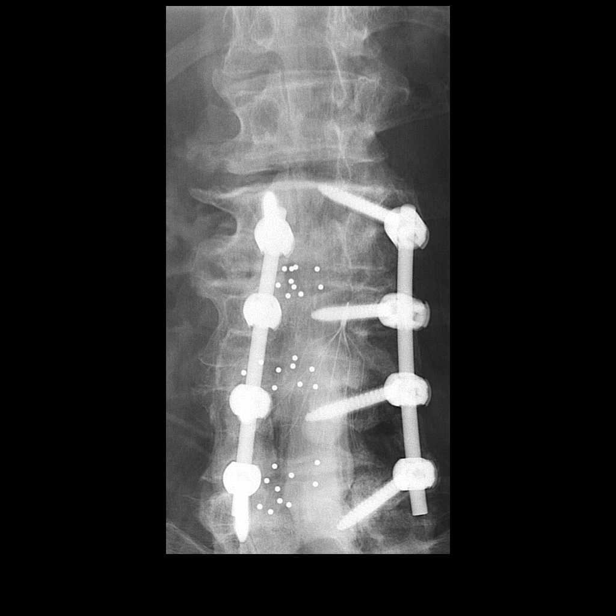

[Series 8: fluoro_myelogram_singleshot_bw · 0.18mm/px · 1 of 1 slices shown (6 of 9)]
[im 1/1]
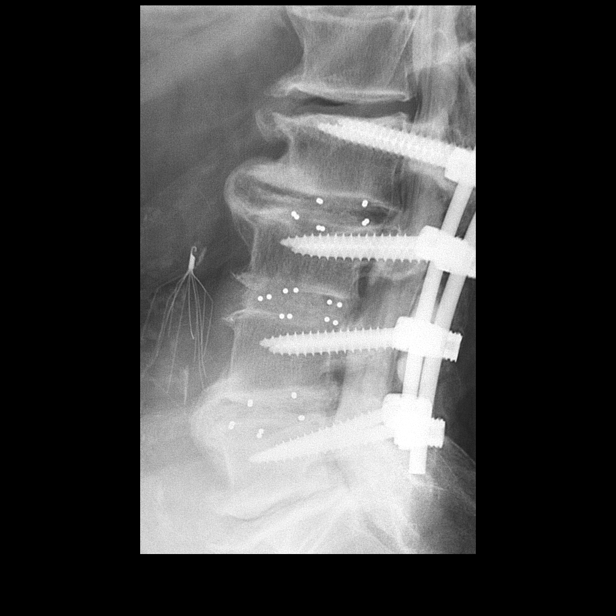

[Series 9: fluoro_myelogram_singleshot_bw · 0.18mm/px · 1 of 1 slices shown (7 of 9)]
[im 1/1]
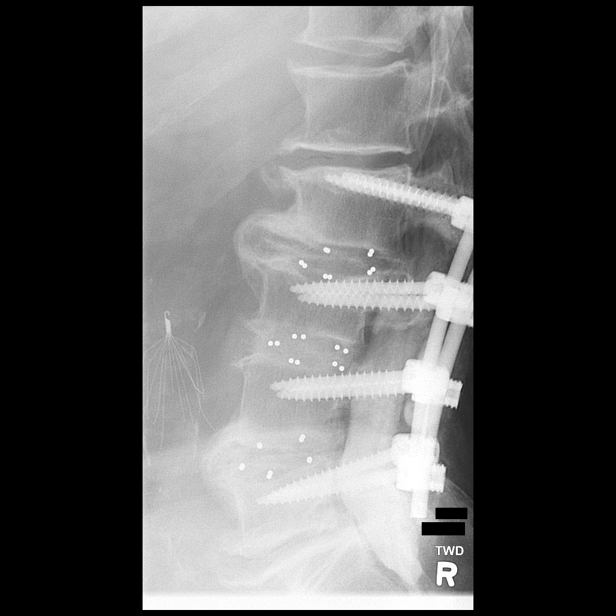

[Series 10: fluoro_myelogram_singleshot_bw · 0.18mm/px · 1 of 1 slices shown (8 of 9)]
[im 1/1]
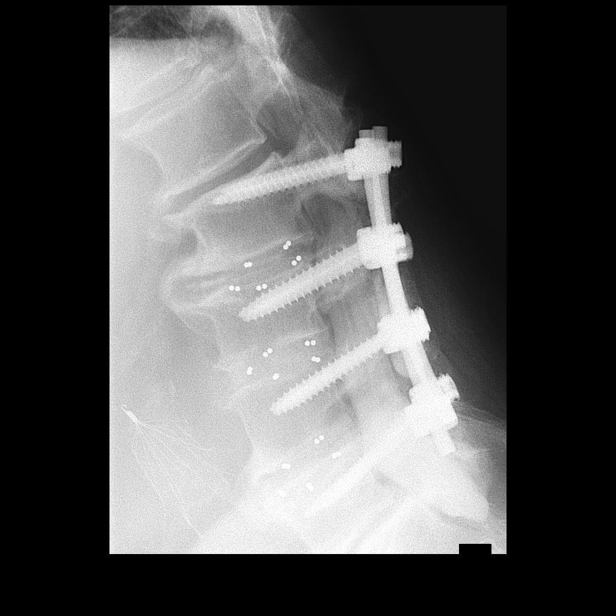

[Series 11: fluoro_myelogram_singleshot_bw · 0.18mm/px · 1 of 1 slices shown (9 of 9)]
[im 1/1]
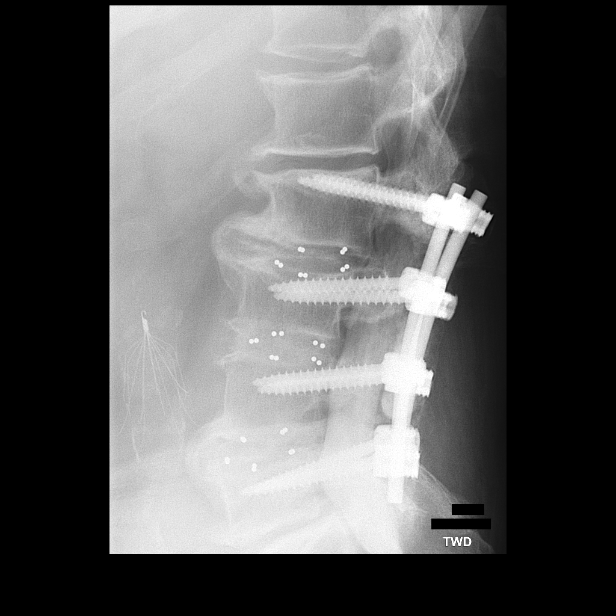

[11 of 11 positions shown; findings below may reference images not displayed]

EXAM:
LUMBAR MYELOGRAM

FLUOROSCOPY TIME:  Radiation Exposure Index (as provided by the
fluoroscopic device): 1534.76 microGray*m^2

Fluoroscopy Time (in minutes and seconds):  24 seconds

PROCEDURE:
Lumbar puncture and intrathecal contrast administration were
performed by Dr. Ava who will separately report for the portion
of the procedure. I personally supervised acquisition of the
myelogram images.
FINDINGS: LUMBAR MYELOGRAM FINDINGS:

Interval L2-L5 PLIF. There is grade 1 retrolisthesis of L2 on L3
which appears to partially reduce with flexion. Minimal
anterolisthesis of L5 on S1 does not significantly change with
flexion or extension. Decreased range of motion in extension. The
thecal sac appears widely patent throughout the lumbar spine.

CT LUMBAR MYELOGRAM FINDINGS:

Grade 1 retrolisthesis of L2 on L3 5 mm. L3-4 retrolisthesis has
been reduced following surgery. There is trace anterolisthesis of L5
on S1. Sequelae of interval L2-L5 PLIF are identified. The right L2
pedicle screw slightly breaches the L2 superior endplate, however
there is no significant osseous lucency surrounding the screw
threads. There is slight lucency about the left L5 screw in the
pedicle. There is solid interbody osseous fusion at L3-4. Osseous
fusion is also present at L4-5 but is less robust, although there is
a large solid bridging anterior osteophyte at this level as well.
Osseous fusion is not identified across the L2-3 disc space, however
there is progressive bridging anterior osteophytosis at this level
compared to the prior CT which appears solid in some portions. There
is also maturing posterolateral bone graft on the right with
evidence of early solid fusion. There is an oblique lucency through
the posterolateral fusion mass on the right at the level of the L2
screw which may reflect a partially healed fracture. Vacuum disc
phenomenon is present at L1-2 and L5-S1.

The conus medullaris terminates at L1. Multiple low-density renal
lesions are partially visualized bilaterally, compatible with
previously demonstrated cysts but incompletely evaluated on this
examination. An IVC filter is present below the renal veins. There
is aortic atherosclerosis.

T9-10 and T10-11: No disc herniation or stenosis.

T11-12: Left greater than right facet arthrosis without disc
herniation or stenosis, unchanged.

T12-L1: Minimal disc bulging, possible tiny left central disc
extrusion with minimal cephalad migration, and facet arthrosis
without stenosis, unchanged.

L1-2: Mild disc space narrowing. Disc bulging greater to the left
and moderate facet arthrosis without stenosis, unchanged.

L2-3: Interval wide posterior decompression and fusion. No stenosis.

L3-4: Interval wide posterior decompression and fusion. Mild
residual osseous neural foraminal narrowing on the right, improved
from prior. No spinal or left neural foraminal stenosis. Opacified
dorsal epidural collection in the laminectomy bed measures 3.6 x
x 3.9 cm.

L4-5: Interval wide posterior decompression and fusion. Thecal sac
is widely patent. Minimal residual left neural foraminal narrowing.
Osseous spurring results in moderate residual right neural foraminal
stenosis.

L5-S1: Disc bulging, prominent foraminal endplate spurring, and
moderate right and severe left facet arthrosis result in moderate to
severe right and severe left neural foraminal stenosis, unchanged an
with likely left L5 nerve root impingement. No spinal or lateral
recess stenosis.
IMPRESSION: 1. Interval L2-L5 posterior decompression and fusion. Thecal sac is
widely patent. Solid osseous fusion at L3-4 and L4-5.
2. Grade 1 retrolisthesis of L2 on L3 which appears to partially
reduce with flexion, however this may be projectional as there is
maturing solid posterolateral osseous fusion on the right as well as
solid bridging anterior endplate osteophytes. No solid fusion across
the disc space. Possible nondisplaced healing fracture through the
posterolateral fusion mass at the level of the right L2 screw.
3. Unchanged disc and facet degeneration at L5-S1 with moderate to
severe right and severe left neural foraminal stenosis likely
resulting in L5 nerve impingement.
4. 4 cm fluid collection at the L3-4 laminectomy site. Contrast
within the collection may be related to the injection at this level
or reflect a pseudomeningocele.

## 2017-06-05 IMAGING — CT CT L SPINE W/ CM
3 of 19 series · 7 of 34 positions shown, 8 images · non-contrast
Comparison: Lumbar spine CT myelogram 04/13/2016

CLINICAL DATA: Left buttock/hip pain which radiates down the
lateral aspect of the left leg. Prior lumbar fusion.
TECHNIQUE: Contiguous axial images were obtained through the Lumbar spine after
the intrathecal infusion of infusion. Coronal and sagittal
reconstructions were obtained of the axial image sets.

[Series 209: o-mar, coronal st, idose (2) · coronal · 0.44mm/px · 1 of 111 slices shown]
[im 56/111  bone]
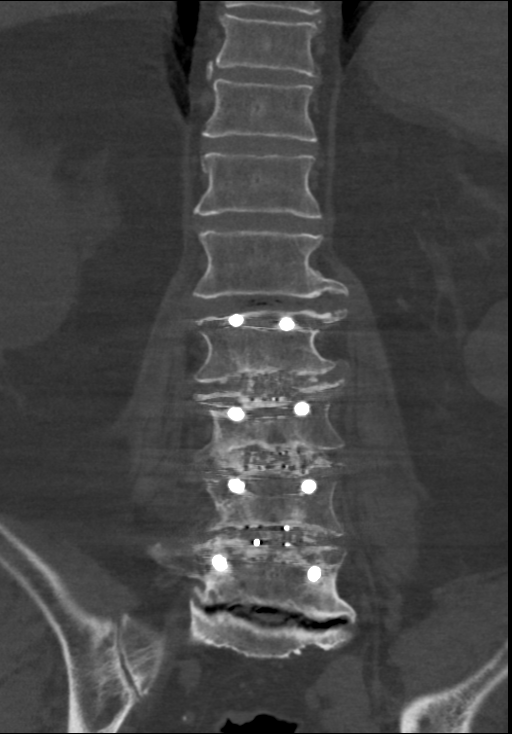

[Series 211: o-mar, sagittal st, idose (2) · sagittal · 0.44mm/px · 5 of 111 slices shown]
[im 19/111  bone]
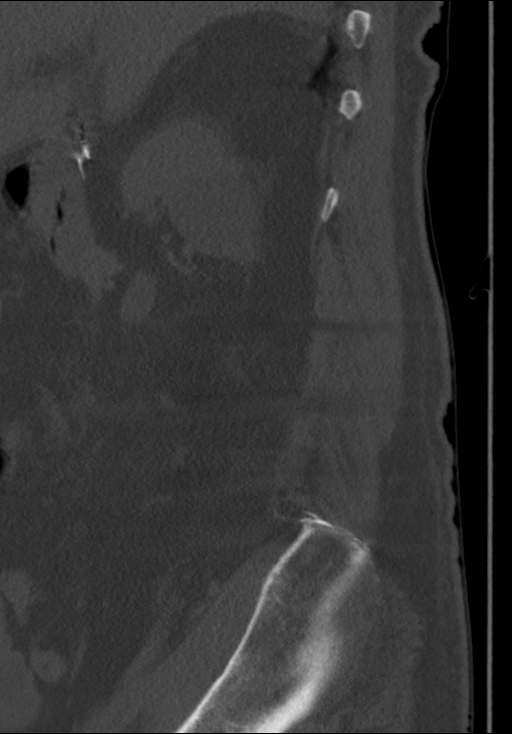
[im 37/111  bone]
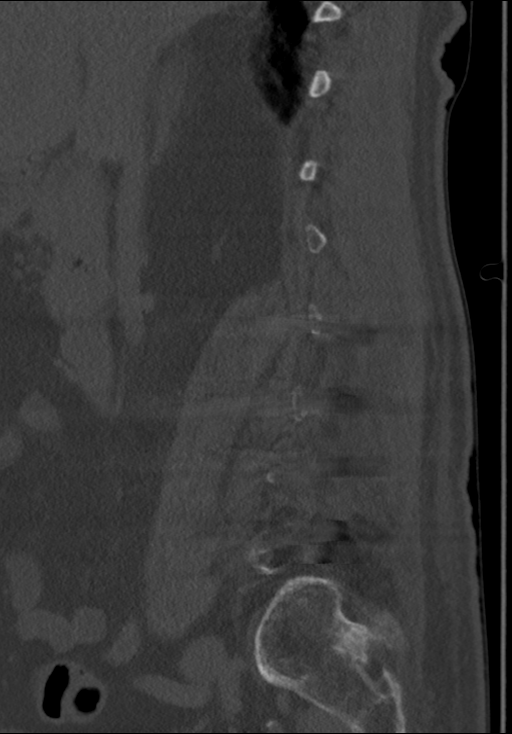
[im 56/111  bone]
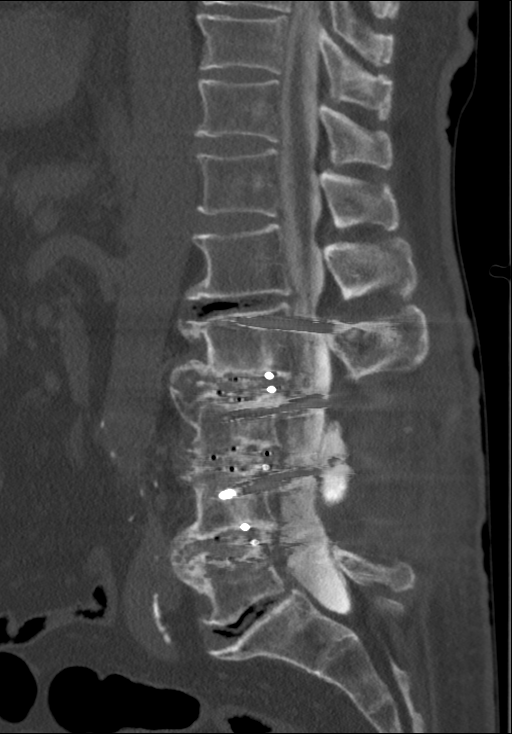
[im 74/111  bone]
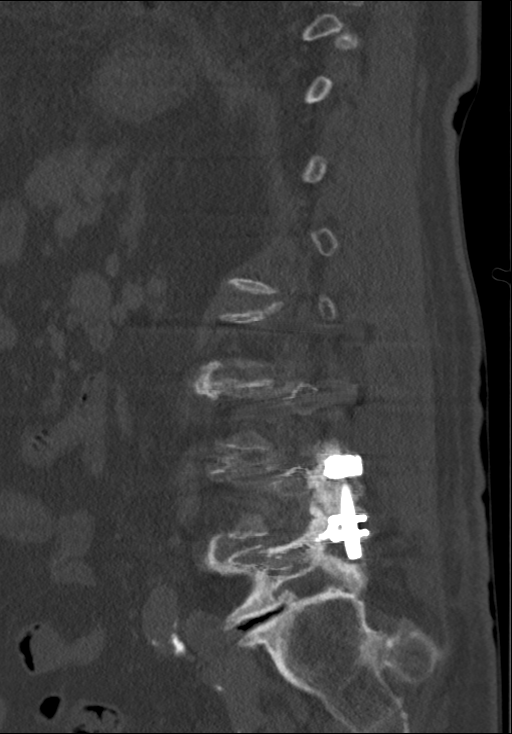
[im 92/111  bone]
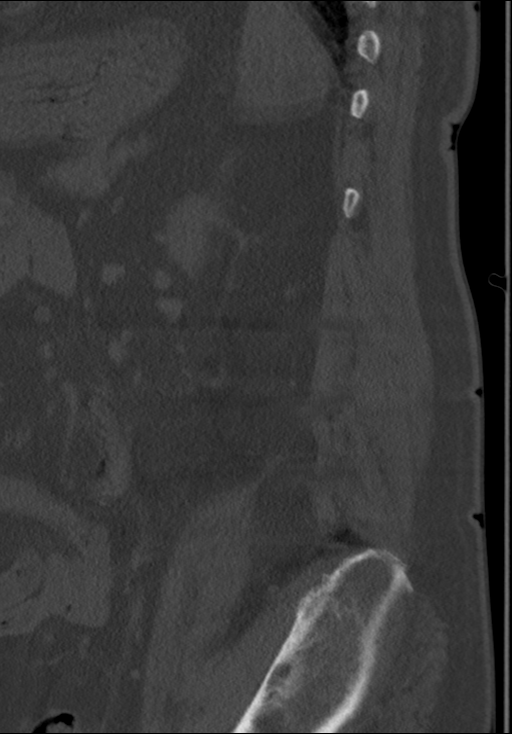

[Series 213: angled disc , idose (2) · axial · 0.44mm/px · z∈[+430,+430]mm · 1 of 27 slices shown, 2 images]
[im 1/27  soft-tissue]
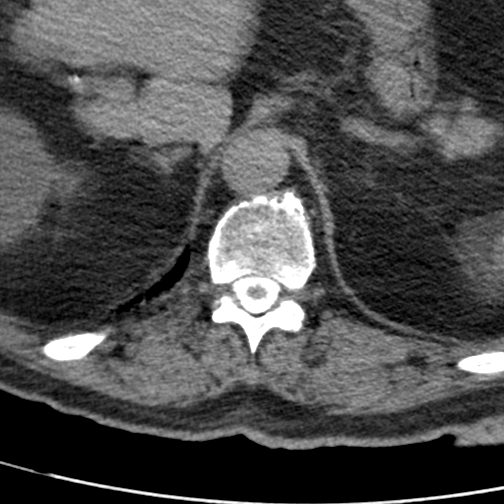
[im 1/27  bone]
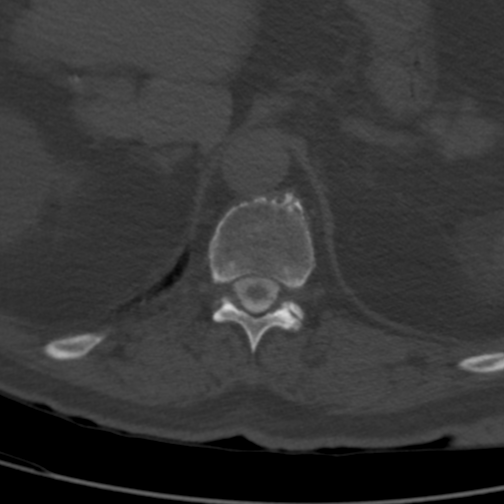

[7 of 34 positions shown; findings below may reference images not displayed]

EXAM:
LUMBAR MYELOGRAM

FLUOROSCOPY TIME:  Radiation Exposure Index (as provided by the
fluoroscopic device): 1534.76 microGray*m^2

Fluoroscopy Time (in minutes and seconds):  24 seconds

PROCEDURE:
Lumbar puncture and intrathecal contrast administration were
performed by Dr. Ava who will separately report for the portion
of the procedure. I personally supervised acquisition of the
myelogram images.
FINDINGS: LUMBAR MYELOGRAM FINDINGS:

Interval L2-L5 PLIF. There is grade 1 retrolisthesis of L2 on L3
which appears to partially reduce with flexion. Minimal
anterolisthesis of L5 on S1 does not significantly change with
flexion or extension. Decreased range of motion in extension. The
thecal sac appears widely patent throughout the lumbar spine.

CT LUMBAR MYELOGRAM FINDINGS:

Grade 1 retrolisthesis of L2 on L3 5 mm. L3-4 retrolisthesis has
been reduced following surgery. There is trace anterolisthesis of L5
on S1. Sequelae of interval L2-L5 PLIF are identified. The right L2
pedicle screw slightly breaches the L2 superior endplate, however
there is no significant osseous lucency surrounding the screw
threads. There is slight lucency about the left L5 screw in the
pedicle. There is solid interbody osseous fusion at L3-4. Osseous
fusion is also present at L4-5 but is less robust, although there is
a large solid bridging anterior osteophyte at this level as well.
Osseous fusion is not identified across the L2-3 disc space, however
there is progressive bridging anterior osteophytosis at this level
compared to the prior CT which appears solid in some portions. There
is also maturing posterolateral bone graft on the right with
evidence of early solid fusion. There is an oblique lucency through
the posterolateral fusion mass on the right at the level of the L2
screw which may reflect a partially healed fracture. Vacuum disc
phenomenon is present at L1-2 and L5-S1.

The conus medullaris terminates at L1. Multiple low-density renal
lesions are partially visualized bilaterally, compatible with
previously demonstrated cysts but incompletely evaluated on this
examination. An IVC filter is present below the renal veins. There
is aortic atherosclerosis.

T9-10 and T10-11: No disc herniation or stenosis.

T11-12: Left greater than right facet arthrosis without disc
herniation or stenosis, unchanged.

T12-L1: Minimal disc bulging, possible tiny left central disc
extrusion with minimal cephalad migration, and facet arthrosis
without stenosis, unchanged.

L1-2: Mild disc space narrowing. Disc bulging greater to the left
and moderate facet arthrosis without stenosis, unchanged.

L2-3: Interval wide posterior decompression and fusion. No stenosis.

L3-4: Interval wide posterior decompression and fusion. Mild
residual osseous neural foraminal narrowing on the right, improved
from prior. No spinal or left neural foraminal stenosis. Opacified
dorsal epidural collection in the laminectomy bed measures 3.6 x
x 3.9 cm.

L4-5: Interval wide posterior decompression and fusion. Thecal sac
is widely patent. Minimal residual left neural foraminal narrowing.
Osseous spurring results in moderate residual right neural foraminal
stenosis.

L5-S1: Disc bulging, prominent foraminal endplate spurring, and
moderate right and severe left facet arthrosis result in moderate to
severe right and severe left neural foraminal stenosis, unchanged an
with likely left L5 nerve root impingement. No spinal or lateral
recess stenosis.
IMPRESSION: 1. Interval L2-L5 posterior decompression and fusion. Thecal sac is
widely patent. Solid osseous fusion at L3-4 and L4-5.
2. Grade 1 retrolisthesis of L2 on L3 which appears to partially
reduce with flexion, however this may be projectional as there is
maturing solid posterolateral osseous fusion on the right as well as
solid bridging anterior endplate osteophytes. No solid fusion across
the disc space. Possible nondisplaced healing fracture through the
posterolateral fusion mass at the level of the right L2 screw.
3. Unchanged disc and facet degeneration at L5-S1 with moderate to
severe right and severe left neural foraminal stenosis likely
resulting in L5 nerve impingement.
4. 4 cm fluid collection at the L3-4 laminectomy site. Contrast
within the collection may be related to the injection at this level
or reflect a pseudomeningocele.

## 2017-07-24 ENCOUNTER — Ambulatory Visit (AMBULATORY_SURGERY_CENTER): Payer: Self-pay | Admitting: *Deleted

## 2017-07-24 VITALS — Ht 74.0 in | Wt 264.2 lb

## 2017-07-24 DIAGNOSIS — Z86010 Personal history of colonic polyps: Secondary | ICD-10-CM

## 2017-07-24 MED ORDER — NA SULFATE-K SULFATE-MG SULF 17.5-3.13-1.6 GM/177ML PO SOLN: 1.0000 [IU] | Freq: Once | ORAL | 0 refills | Status: AC

## 2017-07-24 NOTE — Progress Notes (Signed)
No egg or soy allergy known to patient  No issues with past sedation with any surgeries  or procedures, no intubation problems  No diet pills per patient No home 02 use per patient  No blood thinners per patient  Pt denies issues with constipation on stool softener daily no problems while taking No A fib or A flutter  EMMI video sent to pt's e mail  Pt. declines

## 2017-07-25 ENCOUNTER — Encounter: Payer: Self-pay | Admitting: Internal Medicine

## 2017-08-07 ENCOUNTER — Ambulatory Visit (AMBULATORY_SURGERY_CENTER): Payer: Medicare Other | Admitting: Internal Medicine

## 2017-08-07 ENCOUNTER — Encounter: Payer: Self-pay | Admitting: Internal Medicine

## 2017-08-07 VITALS — BP 133/67 | HR 74 | Temp 98.4°F | Resp 14 | Ht 74.0 in | Wt 264.0 lb

## 2017-08-07 DIAGNOSIS — Z86010 Personal history of colonic polyps: Secondary | ICD-10-CM | POA: Diagnosis not present

## 2017-08-07 MED ORDER — FLEET ENEMA 7-19 GM/118ML RE ENEM
1.0000 | ENEMA | Freq: Once | RECTAL | Status: AC
  Administered 2017-08-07: 1 via RECTAL

## 2017-08-07 MED ORDER — SODIUM CHLORIDE 0.9 % IV SOLN: 500.0000 mL | INTRAVENOUS | Status: DC

## 2017-08-07 NOTE — Patient Instructions (Signed)
YOU HAD AN ENDOSCOPIC PROCEDURE TODAY AT THE [facility]:   Refer to the procedure report that was given to you for any specific questions about what was found during the examination.  If the procedure report does not answer your questions, please call your gastroenterologist to clarify.  If you requested that your care partner not be given the details of your procedure findings, then the procedure report has been included in a sealed envelope for you to review at your convenience later.  YOU SHOULD EXPECT: Some feelings of bloating in the abdomen. Passage of more gas than usual.  Walking can help get rid of the air that was put into your GI tract during the procedure and reduce the bloating. If you had a lower endoscopy (such as a colonoscopy or flexible sigmoidoscopy) you may notice spotting of blood in your stool or on the toilet paper. If you underwent a bowel prep for your procedure, you may not have a normal bowel movement for a few days.  Please Note:  You might notice some irritation and congestion in your nose or some drainage.  This is from the oxygen used during your procedure.  There is no need for concern and it should clear up in a day or so.  SYMPTOMS TO REPORT IMMEDIATELY:   Following lower endoscopy (colonoscopy or flexible sigmoidoscopy):  Excessive amounts of blood in the stool  Significant tenderness or worsening of abdominal pains  Swelling of the abdomen that is new, acute  Fever of 100F or higher  For urgent or emergent issues, a gastroenterologist can be reached at any hour by calling [phone].   DIET:  We do recommend a small meal at first, but then you may proceed to your regular diet.  Drink plenty of fluids but you should avoid alcoholic beverages for 24 hours.  ACTIVITY:  You should plan to take it easy for the rest of today and you should NOT DRIVE or use heavy machinery until tomorrow (because of the sedation medicines used during the test).     FOLLOW UP: Our staff will call the number listed on your records the next business day following your procedure to check on you and address any questions or concerns that you may have regarding the information given to you following your procedure. If we do not reach you, we will leave a message.  However, if you are feeling well and you are not experiencing any problems, there is no need to return our call.  We will assume that you have returned to your regular daily activities without incident.  If any biopsies were taken you will be contacted by phone or by letter within the next 1-3 weeks.  Please call us at [phone] if you have not heard about the biopsies in 3 weeks.    SIGNATURES/CONFIDENTIALITY: You and/or your care partner have signed paperwork which will be entered into your electronic medical record.  These signatures attest to the fact that that the information above on your After Visit Summary has been reviewed and is understood.  Full responsibility of the confidentiality of this discharge information lies with you and/or your care-partner. 

## 2017-08-07 NOTE — Progress Notes (Signed)
To PACU VSS. Report to [email or web address] 

## 2017-08-07 NOTE — Progress Notes (Signed)
On admission,pt states he only took 1st part of prep then vomited yesterday evening between 5:00 - 6:00. Pt did not do 2nd part of prep as instructed since 1st part made him sick at stomach yesterday. Pt says last BM was around 6am today and it was tan . Informed Dr Henrene Pastor . Fleets enema given as ordered by Dr Henrene Pastor. Results of enema gold/tan and clear . Reported to Dr Henrene Pastor.

## 2017-08-07 NOTE — Op Note (Signed)
[place] Patient Name: [name] Procedure Date: 08/07/2017 11:25 AM MRN: [id number] Endoscopist: Docia Chuck. Henrene Pastor , MD Age: 81 Referring MD:  Date of Birth: Feb 04, 1936 Gender: Male Account #: [id number] Procedure:                Colonoscopy Indications:              High risk colon cancer surveillance: Personal                            history of adenoma (10 mm or greater in size)[name]                            previous examinations 2006 and 2013 Medicines:                Monitored Anesthesia Care Procedure:                Pre-Anesthesia Assessment:                           - Prior to the procedure, a History and Physical                            was performed, and patient medications and                            allergies were reviewed. The patient's tolerance of                            previous anesthesia was also reviewed. The risks                            and benefits of the procedure and the sedation                            options and risks were discussed with the patient.                            All questions were answered, and informed consent                            was obtained. Prior Anticoagulants: The patient has                            taken no previous anticoagulant or antiplatelet                            agents. ASA Grade Assessment: II - A patient with                            mild systemic disease. After reviewing the risks                            and benefits, the patient was deemed in  satisfactory condition to undergo the procedure.                           After obtaining informed consent, the colonoscope                            was passed under direct vision. Throughout the                            procedure, the patient's blood pressure, pulse, and                            oxygen saturations were monitored continuously. The                            Colonoscope was introduced  through the anus and                            advanced to the the cecum, identified by                            appendiceal orifice and ileocecal valve. The                            ileocecal valve, appendiceal orifice, and rectum                            were photographed. The quality of the bowel                            preparation was good. The colonoscopy was performed                            without difficulty. The patient tolerated the                            procedure well. The bowel preparation used was                            SUPREP. Scope In: 11:39:41 AM Scope Out: 11:54:35 AM Scope Withdrawal Time: 0 hours 11 minutes 44 seconds  Total Procedure Duration: 0 hours 14 minutes 54 seconds  Findings:                 The entire examined colon appeared normal on direct                            and retroflexion views. Complications:            No immediate complications. Estimated blood loss:                            None. Estimated Blood Loss:     Estimated blood loss: none. Impression:               - The entire examined colon is normal on  direct and                            retroflexion views.                           - No specimens collected. Recommendation:           - Repeat colonoscopy is not recommended for                            surveillance.                           - Patient has a contact number available for                            emergencies. The signs and symptoms of potential                            delayed complications were discussed with the                            patient. Return to normal activities tomorrow.                            Written discharge instructions were provided to the                            patient.                           - Resume previous diet.                           - Continue present medications. Docia Chuck. Henrene Pastor, MD 08/07/2017 11:57:56 AM This report has been signed electronically.

## 2017-08-08 ENCOUNTER — Telehealth: Payer: Self-pay | Admitting: *Deleted

## 2017-08-08 NOTE — Telephone Encounter (Signed)
  Follow up Call-  Call back number 08/07/2017  Post procedure Call Back phone  # [phone]  Permission to leave phone message Yes  Some recent data might be hidden     Patient questions:  Do you have a fever, pain , or abdominal swelling? No. Pain Score  0 *  Have you tolerated food without any problems? Yes.    Have you been able to return to your normal activities? Yes.    Do you have any questions about your discharge instructions: Diet   No. Medications  No. Follow up visit  No.  Do you have questions or concerns about your Care? No.  Actions: * If pain score is 4 or above: No action needed, pain <4.

## 2017-10-31 ENCOUNTER — Ambulatory Visit: Payer: Medicare Other | Admitting: Physical Therapy

## 2017-11-06 ENCOUNTER — Ambulatory Visit: Payer: Medicare Other | Attending: Internal Medicine | Admitting: Physical Therapy

## 2017-11-06 ENCOUNTER — Encounter: Payer: Self-pay | Admitting: Physical Therapy

## 2017-11-06 DIAGNOSIS — R262 Difficulty in walking, not elsewhere classified: Secondary | ICD-10-CM | POA: Insufficient documentation

## 2017-11-06 DIAGNOSIS — M6281 Muscle weakness (generalized): Secondary | ICD-10-CM | POA: Diagnosis present

## 2017-11-06 DIAGNOSIS — R29898 Other symptoms and signs involving the musculoskeletal system: Secondary | ICD-10-CM | POA: Insufficient documentation

## 2017-11-06 DIAGNOSIS — R2681 Unsteadiness on feet: Secondary | ICD-10-CM | POA: Diagnosis present
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
## 2017-11-06 NOTE — Patient Instructions (Signed)
Heel Raise (Calf Strength / Balance)    Stand with support, __0_ lb weights on ankles. Breathe in. Rise up on tiptoes, breathing out through pursed lips. Hold position to count of __2_. Return slowly, breathing in. Repeat __10_ times per session. Do__3_ sessions per day. Variation: Do without weights.  Copyright  VHI. All rights reserved.   Sit to Stand: Easy    Get ON TARGET. Long roller in front or use arms of the chair for support, erect posture, feet on floor. Leaning at hips, shift weight forward. Push on roll or arms of chair to stand. Hold _3__ seconds. Repeat _5__ times. Do _3_ sessions per day.  Copyright  VHI. All rights reserved.   [facility] [address] [place], [place] [address] Phone # [phone] Fax [phone]

## 2017-11-12 ENCOUNTER — Ambulatory Visit: Payer: Medicare Other | Admitting: Physical Therapy

## 2017-11-12 DIAGNOSIS — M6281 Muscle weakness (generalized): Secondary | ICD-10-CM

## 2017-11-12 DIAGNOSIS — R262 Difficulty in walking, not elsewhere classified: Secondary | ICD-10-CM | POA: Diagnosis not present

## 2017-11-12 DIAGNOSIS — R29898 Other symptoms and signs involving the musculoskeletal system: Secondary | ICD-10-CM

## 2017-11-12 NOTE — Patient Instructions (Signed)
   ELASTIC BAND - HAMSTRING CURL  While seated and an elastic band attched to your ankle, bend your knee and draw back your foot.    2x10-15 reps each side. Use green band   [facility] [address] [place], [place] [address] Phone # [phone] Fax [phone]
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
## 2017-11-14 ENCOUNTER — Ambulatory Visit: Payer: Medicare Other | Admitting: Physical Therapy

## 2017-11-14 DIAGNOSIS — R2681 Unsteadiness on feet: Secondary | ICD-10-CM

## 2017-11-14 DIAGNOSIS — R29898 Other symptoms and signs involving the musculoskeletal system: Secondary | ICD-10-CM

## 2017-11-14 DIAGNOSIS — M6281 Muscle weakness (generalized): Secondary | ICD-10-CM

## 2017-11-14 DIAGNOSIS — R262 Difficulty in walking, not elsewhere classified: Secondary | ICD-10-CM
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
## 2017-11-19 ENCOUNTER — Ambulatory Visit: Payer: Medicare Other | Admitting: Physical Therapy

## 2017-11-21 ENCOUNTER — Ambulatory Visit: Payer: Medicare Other | Attending: Internal Medicine | Admitting: Physical Therapy

## 2017-11-21 ENCOUNTER — Encounter: Payer: Self-pay | Admitting: Physical Therapy

## 2017-11-21 DIAGNOSIS — M6281 Muscle weakness (generalized): Secondary | ICD-10-CM

## 2017-11-21 DIAGNOSIS — R29898 Other symptoms and signs involving the musculoskeletal system: Secondary | ICD-10-CM | POA: Insufficient documentation

## 2017-11-21 DIAGNOSIS — R2681 Unsteadiness on feet: Secondary | ICD-10-CM | POA: Insufficient documentation

## 2017-11-21 DIAGNOSIS — R262 Difficulty in walking, not elsewhere classified: Secondary | ICD-10-CM
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
## 2017-11-22 ENCOUNTER — Ambulatory Visit: Payer: Medicare Other | Admitting: Physical Therapy

## 2017-11-22 ENCOUNTER — Encounter: Payer: Self-pay | Admitting: Physical Therapy

## 2017-11-22 DIAGNOSIS — R262 Difficulty in walking, not elsewhere classified: Secondary | ICD-10-CM

## 2017-11-22 DIAGNOSIS — R29898 Other symptoms and signs involving the musculoskeletal system: Secondary | ICD-10-CM

## 2017-11-22 DIAGNOSIS — M6281 Muscle weakness (generalized): Secondary | ICD-10-CM

## 2017-11-22 DIAGNOSIS — R2681 Unsteadiness on feet: Secondary | ICD-10-CM

## 2017-11-22 NOTE — Patient Instructions (Addendum)
Walk 3 ten minute sessions per day around your house   [facility] [address], [place], [place] [address] Phone # [phone] Fax [phone]
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
## 2017-11-26 ENCOUNTER — Encounter: Payer: Medicare Other | Admitting: Physical Therapy

## 2017-11-28 ENCOUNTER — Encounter: Payer: Self-pay | Admitting: Physical Therapy

## 2017-11-28 ENCOUNTER — Ambulatory Visit: Payer: Medicare Other | Admitting: Physical Therapy

## 2017-11-28 DIAGNOSIS — R262 Difficulty in walking, not elsewhere classified: Secondary | ICD-10-CM | POA: Diagnosis not present

## 2017-11-28 DIAGNOSIS — R29898 Other symptoms and signs involving the musculoskeletal system: Secondary | ICD-10-CM

## 2017-11-28 DIAGNOSIS — R2681 Unsteadiness on feet: Secondary | ICD-10-CM

## 2017-11-28 DIAGNOSIS — M6281 Muscle weakness (generalized): Secondary | ICD-10-CM
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
## 2017-12-03 ENCOUNTER — Encounter: Payer: Self-pay | Admitting: Physical Therapy

## 2017-12-03 ENCOUNTER — Ambulatory Visit: Payer: Medicare Other | Admitting: Physical Therapy

## 2017-12-03 DIAGNOSIS — R2681 Unsteadiness on feet: Secondary | ICD-10-CM

## 2017-12-03 DIAGNOSIS — M6281 Muscle weakness (generalized): Secondary | ICD-10-CM

## 2017-12-03 DIAGNOSIS — R29898 Other symptoms and signs involving the musculoskeletal system: Secondary | ICD-10-CM

## 2017-12-03 DIAGNOSIS — R262 Difficulty in walking, not elsewhere classified: Secondary | ICD-10-CM
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
## 2017-12-05 ENCOUNTER — Ambulatory Visit: Payer: Medicare Other | Admitting: Physical Therapy

## 2017-12-05 ENCOUNTER — Encounter: Payer: Self-pay | Admitting: Physical Therapy

## 2017-12-05 DIAGNOSIS — R29898 Other symptoms and signs involving the musculoskeletal system: Secondary | ICD-10-CM

## 2017-12-05 DIAGNOSIS — R262 Difficulty in walking, not elsewhere classified: Secondary | ICD-10-CM

## 2017-12-05 DIAGNOSIS — M6281 Muscle weakness (generalized): Secondary | ICD-10-CM

## 2017-12-05 DIAGNOSIS — R2681 Unsteadiness on feet: Secondary | ICD-10-CM
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
## 2017-12-10 ENCOUNTER — Ambulatory Visit: Payer: Medicare Other | Admitting: Physical Therapy

## 2017-12-10 DIAGNOSIS — R29898 Other symptoms and signs involving the musculoskeletal system: Secondary | ICD-10-CM

## 2017-12-10 DIAGNOSIS — R2681 Unsteadiness on feet: Secondary | ICD-10-CM

## 2017-12-10 DIAGNOSIS — M6281 Muscle weakness (generalized): Secondary | ICD-10-CM

## 2017-12-10 DIAGNOSIS — R262 Difficulty in walking, not elsewhere classified: Secondary | ICD-10-CM
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
## 2017-12-12 ENCOUNTER — Encounter: Payer: Self-pay | Admitting: Physical Therapy

## 2017-12-12 ENCOUNTER — Ambulatory Visit: Payer: Medicare Other | Admitting: Physical Therapy

## 2017-12-12 DIAGNOSIS — R2681 Unsteadiness on feet: Secondary | ICD-10-CM

## 2017-12-12 DIAGNOSIS — R262 Difficulty in walking, not elsewhere classified: Secondary | ICD-10-CM

## 2017-12-12 DIAGNOSIS — R29898 Other symptoms and signs involving the musculoskeletal system: Secondary | ICD-10-CM

## 2017-12-12 DIAGNOSIS — M6281 Muscle weakness (generalized): Secondary | ICD-10-CM
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
## 2017-12-20 ENCOUNTER — Encounter: Payer: Self-pay | Admitting: Rheumatology

## 2017-12-20 ENCOUNTER — Ambulatory Visit: Payer: Medicare Other | Attending: Internal Medicine | Admitting: Physical Therapy

## 2017-12-20 ENCOUNTER — Encounter: Payer: Self-pay | Admitting: Physical Therapy

## 2017-12-20 ENCOUNTER — Ambulatory Visit (INDEPENDENT_AMBULATORY_CARE_PROVIDER_SITE_OTHER): Payer: Medicare Other | Admitting: Rheumatology

## 2017-12-20 VITALS — BP 122/64 | HR 70 | Resp 20 | Ht 74.0 in | Wt 257.0 lb

## 2017-12-20 DIAGNOSIS — M170 Bilateral primary osteoarthritis of knee: Secondary | ICD-10-CM

## 2017-12-20 DIAGNOSIS — Z8781 Personal history of (healed) traumatic fracture: Secondary | ICD-10-CM

## 2017-12-20 DIAGNOSIS — Z8719 Personal history of other diseases of the digestive system: Secondary | ICD-10-CM | POA: Diagnosis not present

## 2017-12-20 DIAGNOSIS — M2242 Chondromalacia patellae, left knee: Secondary | ICD-10-CM | POA: Diagnosis not present

## 2017-12-20 DIAGNOSIS — Z8551 Personal history of malignant neoplasm of bladder: Secondary | ICD-10-CM | POA: Diagnosis not present

## 2017-12-20 DIAGNOSIS — R29898 Other symptoms and signs involving the musculoskeletal system: Secondary | ICD-10-CM | POA: Diagnosis present

## 2017-12-20 DIAGNOSIS — Z89011 Acquired absence of right thumb: Secondary | ICD-10-CM

## 2017-12-20 DIAGNOSIS — R2681 Unsteadiness on feet: Secondary | ICD-10-CM | POA: Diagnosis present

## 2017-12-20 DIAGNOSIS — Z96612 Presence of left artificial shoulder joint: Secondary | ICD-10-CM

## 2017-12-20 DIAGNOSIS — Z8546 Personal history of malignant neoplasm of prostate: Secondary | ICD-10-CM | POA: Diagnosis not present

## 2017-12-20 DIAGNOSIS — M5136 Other intervertebral disc degeneration, lumbar region: Secondary | ICD-10-CM

## 2017-12-20 DIAGNOSIS — R262 Difficulty in walking, not elsewhere classified: Secondary | ICD-10-CM | POA: Insufficient documentation

## 2017-12-20 DIAGNOSIS — Z87891 Personal history of nicotine dependence: Secondary | ICD-10-CM

## 2017-12-20 DIAGNOSIS — M2241 Chondromalacia patellae, right knee: Secondary | ICD-10-CM | POA: Diagnosis not present

## 2017-12-20 DIAGNOSIS — Z8639 Personal history of other endocrine, nutritional and metabolic disease: Secondary | ICD-10-CM | POA: Diagnosis not present

## 2017-12-20 DIAGNOSIS — M6281 Muscle weakness (generalized): Secondary | ICD-10-CM | POA: Diagnosis present

## 2017-12-20 DIAGNOSIS — Z9889 Other specified postprocedural states: Secondary | ICD-10-CM | POA: Diagnosis not present
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
## 2017-12-24 ENCOUNTER — Encounter: Payer: Self-pay | Admitting: Physical Therapy

## 2017-12-24 ENCOUNTER — Ambulatory Visit: Payer: Medicare Other | Admitting: Physical Therapy

## 2017-12-24 DIAGNOSIS — R2681 Unsteadiness on feet: Secondary | ICD-10-CM

## 2017-12-24 DIAGNOSIS — R29898 Other symptoms and signs involving the musculoskeletal system: Secondary | ICD-10-CM

## 2017-12-24 DIAGNOSIS — M6281 Muscle weakness (generalized): Secondary | ICD-10-CM

## 2017-12-24 DIAGNOSIS — R262 Difficulty in walking, not elsewhere classified: Secondary | ICD-10-CM
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
## 2017-12-26 ENCOUNTER — Ambulatory Visit: Payer: Medicare Other | Admitting: Physical Therapy

## 2017-12-26 ENCOUNTER — Encounter: Payer: Self-pay | Admitting: Physical Therapy

## 2017-12-26 DIAGNOSIS — M6281 Muscle weakness (generalized): Secondary | ICD-10-CM

## 2017-12-26 DIAGNOSIS — R2681 Unsteadiness on feet: Secondary | ICD-10-CM

## 2017-12-26 DIAGNOSIS — R262 Difficulty in walking, not elsewhere classified: Secondary | ICD-10-CM

## 2017-12-26 DIAGNOSIS — R29898 Other symptoms and signs involving the musculoskeletal system: Secondary | ICD-10-CM
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
## 2017-12-31 ENCOUNTER — Ambulatory Visit: Payer: Medicare Other | Admitting: Physical Therapy

## 2018-01-01 ENCOUNTER — Encounter: Payer: Self-pay | Admitting: Physical Therapy

## 2018-01-01 ENCOUNTER — Ambulatory Visit: Payer: Medicare Other | Admitting: Physical Therapy

## 2018-01-01 DIAGNOSIS — R29898 Other symptoms and signs involving the musculoskeletal system: Secondary | ICD-10-CM

## 2018-01-01 DIAGNOSIS — R2681 Unsteadiness on feet: Secondary | ICD-10-CM

## 2018-01-01 DIAGNOSIS — R262 Difficulty in walking, not elsewhere classified: Secondary | ICD-10-CM

## 2018-01-01 DIAGNOSIS — M6281 Muscle weakness (generalized): Secondary | ICD-10-CM
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
## 2018-01-04 ENCOUNTER — Ambulatory Visit: Payer: Medicare Other | Admitting: Physical Therapy

## 2018-01-04 ENCOUNTER — Encounter: Payer: Self-pay | Admitting: Physical Therapy

## 2018-01-04 DIAGNOSIS — R262 Difficulty in walking, not elsewhere classified: Secondary | ICD-10-CM

## 2018-01-04 DIAGNOSIS — M6281 Muscle weakness (generalized): Secondary | ICD-10-CM

## 2018-01-04 DIAGNOSIS — R2681 Unsteadiness on feet: Secondary | ICD-10-CM

## 2018-01-04 DIAGNOSIS — R29898 Other symptoms and signs involving the musculoskeletal system: Secondary | ICD-10-CM
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
## 2018-01-07 ENCOUNTER — Ambulatory Visit: Payer: Medicare Other | Admitting: Physical Therapy

## 2018-01-07 ENCOUNTER — Encounter: Payer: Self-pay | Admitting: Physical Therapy

## 2018-01-07 ENCOUNTER — Encounter: Payer: Medicare Other | Admitting: Physical Therapy

## 2018-01-07 DIAGNOSIS — R2681 Unsteadiness on feet: Secondary | ICD-10-CM

## 2018-01-07 DIAGNOSIS — M6281 Muscle weakness (generalized): Secondary | ICD-10-CM

## 2018-01-07 DIAGNOSIS — R262 Difficulty in walking, not elsewhere classified: Secondary | ICD-10-CM

## 2018-01-07 DIAGNOSIS — R29898 Other symptoms and signs involving the musculoskeletal system: Secondary | ICD-10-CM
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
## 2018-01-09 ENCOUNTER — Ambulatory Visit: Payer: Medicare Other | Admitting: Physical Therapy

## 2018-01-09 ENCOUNTER — Encounter: Payer: Self-pay | Admitting: Physical Therapy

## 2018-01-09 DIAGNOSIS — R262 Difficulty in walking, not elsewhere classified: Secondary | ICD-10-CM | POA: Diagnosis not present

## 2018-01-09 DIAGNOSIS — R2681 Unsteadiness on feet: Secondary | ICD-10-CM

## 2018-01-09 DIAGNOSIS — M6281 Muscle weakness (generalized): Secondary | ICD-10-CM

## 2018-01-09 DIAGNOSIS — R29898 Other symptoms and signs involving the musculoskeletal system: Secondary | ICD-10-CM
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
## 2018-01-14 ENCOUNTER — Ambulatory Visit: Payer: Medicare Other | Admitting: Physical Therapy

## 2018-01-14 ENCOUNTER — Encounter: Payer: Self-pay | Admitting: Physical Therapy

## 2018-01-14 DIAGNOSIS — R262 Difficulty in walking, not elsewhere classified: Secondary | ICD-10-CM

## 2018-01-14 DIAGNOSIS — R29898 Other symptoms and signs involving the musculoskeletal system: Secondary | ICD-10-CM

## 2018-01-14 DIAGNOSIS — M6281 Muscle weakness (generalized): Secondary | ICD-10-CM

## 2018-01-14 DIAGNOSIS — R2681 Unsteadiness on feet: Secondary | ICD-10-CM
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
## 2018-01-16 ENCOUNTER — Ambulatory Visit: Payer: Medicare Other | Admitting: Physical Therapy

## 2018-01-16 ENCOUNTER — Encounter: Payer: Self-pay | Admitting: Physical Therapy

## 2018-01-16 DIAGNOSIS — R262 Difficulty in walking, not elsewhere classified: Secondary | ICD-10-CM | POA: Diagnosis not present

## 2018-01-16 DIAGNOSIS — R2681 Unsteadiness on feet: Secondary | ICD-10-CM

## 2018-01-16 DIAGNOSIS — M6281 Muscle weakness (generalized): Secondary | ICD-10-CM

## 2018-01-16 DIAGNOSIS — R29898 Other symptoms and signs involving the musculoskeletal system: Secondary | ICD-10-CM
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
## 2018-01-21 ENCOUNTER — Ambulatory Visit: Payer: Medicare Other | Attending: Internal Medicine | Admitting: Physical Therapy

## 2018-01-21 ENCOUNTER — Encounter: Payer: Self-pay | Admitting: Physical Therapy

## 2018-01-21 DIAGNOSIS — R262 Difficulty in walking, not elsewhere classified: Secondary | ICD-10-CM | POA: Diagnosis not present

## 2018-01-21 DIAGNOSIS — R2681 Unsteadiness on feet: Secondary | ICD-10-CM

## 2018-01-21 DIAGNOSIS — R29898 Other symptoms and signs involving the musculoskeletal system: Secondary | ICD-10-CM | POA: Diagnosis present

## 2018-01-21 DIAGNOSIS — M6281 Muscle weakness (generalized): Secondary | ICD-10-CM | POA: Insufficient documentation
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
## 2018-01-23 ENCOUNTER — Encounter: Payer: Self-pay | Admitting: Physical Therapy

## 2018-01-23 ENCOUNTER — Ambulatory Visit: Payer: Medicare Other | Admitting: Physical Therapy

## 2018-01-23 DIAGNOSIS — R29898 Other symptoms and signs involving the musculoskeletal system: Secondary | ICD-10-CM

## 2018-01-23 DIAGNOSIS — R262 Difficulty in walking, not elsewhere classified: Secondary | ICD-10-CM | POA: Diagnosis not present

## 2018-01-23 DIAGNOSIS — M6281 Muscle weakness (generalized): Secondary | ICD-10-CM

## 2018-01-23 DIAGNOSIS — R2681 Unsteadiness on feet: Secondary | ICD-10-CM
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
## 2018-01-28 ENCOUNTER — Ambulatory Visit: Payer: Medicare Other | Admitting: Physical Therapy

## 2018-01-28 ENCOUNTER — Encounter: Payer: Self-pay | Admitting: Physical Therapy

## 2018-01-28 DIAGNOSIS — R2681 Unsteadiness on feet: Secondary | ICD-10-CM

## 2018-01-28 DIAGNOSIS — R262 Difficulty in walking, not elsewhere classified: Secondary | ICD-10-CM | POA: Diagnosis not present

## 2018-01-28 DIAGNOSIS — R29898 Other symptoms and signs involving the musculoskeletal system: Secondary | ICD-10-CM

## 2018-01-28 DIAGNOSIS — M6281 Muscle weakness (generalized): Secondary | ICD-10-CM
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
## 2018-01-30 ENCOUNTER — Ambulatory Visit: Payer: Medicare Other | Admitting: Physical Therapy

## 2018-01-30 ENCOUNTER — Encounter: Payer: Self-pay | Admitting: Physical Therapy

## 2018-01-30 DIAGNOSIS — R262 Difficulty in walking, not elsewhere classified: Secondary | ICD-10-CM | POA: Diagnosis not present

## 2018-01-30 DIAGNOSIS — M6281 Muscle weakness (generalized): Secondary | ICD-10-CM

## 2018-01-30 DIAGNOSIS — R2681 Unsteadiness on feet: Secondary | ICD-10-CM

## 2018-01-30 DIAGNOSIS — R29898 Other symptoms and signs involving the musculoskeletal system: Secondary | ICD-10-CM
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
## 2018-05-16 ENCOUNTER — Ambulatory Visit: Payer: Medicare Other | Admitting: Physical Therapy

## 2018-05-17 ENCOUNTER — Encounter: Payer: Self-pay | Admitting: Rehabilitative and Restorative Service Providers"

## 2018-05-17 ENCOUNTER — Ambulatory Visit: Payer: Medicare Other | Attending: Internal Medicine | Admitting: Rehabilitative and Restorative Service Providers"

## 2018-05-17 DIAGNOSIS — M545 Low back pain, unspecified: Secondary | ICD-10-CM

## 2018-05-17 DIAGNOSIS — R2689 Other abnormalities of gait and mobility: Secondary | ICD-10-CM | POA: Insufficient documentation

## 2018-05-17 DIAGNOSIS — M6281 Muscle weakness (generalized): Secondary | ICD-10-CM | POA: Diagnosis not present

## 2018-05-17 DIAGNOSIS — R2681 Unsteadiness on feet: Secondary | ICD-10-CM | POA: Insufficient documentation
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
## 2018-05-17 NOTE — Patient Instructions (Signed)
Weight Shift: Anterior / Posterior (Righting / Equilibrium)    Slowly shift weight forward, arms back and hips forward over toes, until heels rise off floor. Return to starting position. Shift weight backward, arms forward and hips back over heels, until toes rise off floor. Hold each position __3__ seconds. Repeat __10__ times per session. Do __2__ sessions per day.  Copyright  VHI. All rights reserved.    Functional Quadriceps: Sit to Stand    Sit on edge of chair, feet flat on floor. Stand upright, extending knees fully.  *Make sure to use a sturdy chair, have a table or walker in front of you for safety.  USE YOUR HANDS AT FIRST. Repeat __5__ times per set. Do _2___ sets per session. Do __2__ sessions per day.  http://orth.exer.us/734   Copyright  VHI. All rights reserved.

## 2018-05-23 ENCOUNTER — Encounter: Payer: Self-pay | Admitting: Rehabilitative and Restorative Service Providers"

## 2018-05-23 ENCOUNTER — Ambulatory Visit: Payer: Medicare Other | Attending: Internal Medicine | Admitting: Rehabilitative and Restorative Service Providers"

## 2018-05-23 DIAGNOSIS — M545 Low back pain, unspecified: Secondary | ICD-10-CM

## 2018-05-23 DIAGNOSIS — R2681 Unsteadiness on feet: Secondary | ICD-10-CM | POA: Diagnosis present

## 2018-05-23 DIAGNOSIS — R2689 Other abnormalities of gait and mobility: Secondary | ICD-10-CM | POA: Insufficient documentation

## 2018-05-23 DIAGNOSIS — M6281 Muscle weakness (generalized): Secondary | ICD-10-CM | POA: Insufficient documentation

## 2018-05-23 NOTE — Patient Instructions (Signed)
Weight Shift: Anterior / Posterior (Righting / Equilibrium)    Slowly shift weight forward, arms back and hips forward over toes, until heels rise off floor. Return to starting position. Shift weight backward, arms forward and hips back over heels, until toes rise off floor. Hold each position __3__ seconds. Repeat __10__ times per session. Do __2__ sessions per day.  Copyright  VHI. All rights reserved.    Functional Quadriceps: Sit to Stand    Sit on edge of chair, feet flat on floor. Stand upright, extending knees fully.  *Make sure to use a sturdy chair, have a table or walker in front of you for safety.  USE YOUR HANDS AT FIRST. Repeat __5__ times per set. Do _2___ sets per session. Do __2__ sessions per day.  http://orth.exer.us/734   Hip Flexion - Supine    Lying on back, knees bent, feet on floor, bend hips, bringing knees toward trunk. Repeat _10__ times. Do _1-2__ times per day.  Copyright  VHI. All rights reserved.   Pelvic Tilt: Posterior - Legs Bent (Supine)    Tighten stomach and flatten back by rolling pelvis down. Hold _5___ seconds. Relax. Repeat _10___ times per set. Do __1-2__ sessions per day.  http://orth.exer.us/202   Copyright  VHI. All rights reserved.   Chair Sitting    Sit at edge of seat, spine straight, one leg extended. Lean forward, keeping spine straight.  Hold _30__ seconds. Repeat __2_ times per session. Do _1-2__ sessions per day.  Copyright  VHI. All rights reserved.
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
## 2018-06-03 ENCOUNTER — Ambulatory Visit: Payer: Medicare Other | Admitting: Rehabilitative and Restorative Service Providers"

## 2018-06-07 ENCOUNTER — Ambulatory Visit: Payer: Medicare Other | Admitting: Rehabilitative and Restorative Service Providers"

## 2018-06-07 ENCOUNTER — Encounter: Payer: Self-pay | Admitting: Rehabilitative and Restorative Service Providers"

## 2018-06-07 DIAGNOSIS — R2681 Unsteadiness on feet: Secondary | ICD-10-CM

## 2018-06-07 DIAGNOSIS — R2689 Other abnormalities of gait and mobility: Secondary | ICD-10-CM

## 2018-06-07 DIAGNOSIS — M6281 Muscle weakness (generalized): Secondary | ICD-10-CM
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
## 2018-06-17 ENCOUNTER — Encounter: Payer: Self-pay | Admitting: Physical Therapy

## 2018-06-17 ENCOUNTER — Ambulatory Visit: Payer: Medicare Other | Attending: Internal Medicine | Admitting: Physical Therapy

## 2018-06-17 DIAGNOSIS — R2681 Unsteadiness on feet: Secondary | ICD-10-CM

## 2018-06-17 DIAGNOSIS — R2689 Other abnormalities of gait and mobility: Secondary | ICD-10-CM | POA: Diagnosis present

## 2018-06-17 DIAGNOSIS — M6281 Muscle weakness (generalized): Secondary | ICD-10-CM | POA: Insufficient documentation

## 2018-06-17 DIAGNOSIS — M545 Low back pain: Secondary | ICD-10-CM | POA: Diagnosis present
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
## 2018-06-21 ENCOUNTER — Encounter: Payer: Self-pay | Admitting: Rehabilitative and Restorative Service Providers"

## 2018-06-21 ENCOUNTER — Ambulatory Visit: Payer: Medicare Other | Admitting: Rehabilitative and Restorative Service Providers"

## 2018-06-21 DIAGNOSIS — R2689 Other abnormalities of gait and mobility: Secondary | ICD-10-CM

## 2018-06-21 DIAGNOSIS — M6281 Muscle weakness (generalized): Secondary | ICD-10-CM | POA: Diagnosis not present

## 2018-06-21 DIAGNOSIS — R2681 Unsteadiness on feet: Secondary | ICD-10-CM
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
## 2018-06-24 ENCOUNTER — Ambulatory Visit: Payer: Medicare Other | Admitting: Physical Therapy

## 2018-06-24 DIAGNOSIS — M6281 Muscle weakness (generalized): Secondary | ICD-10-CM | POA: Diagnosis not present

## 2018-06-24 DIAGNOSIS — R2689 Other abnormalities of gait and mobility: Secondary | ICD-10-CM

## 2018-06-24 DIAGNOSIS — R2681 Unsteadiness on feet: Secondary | ICD-10-CM

## 2018-06-25 ENCOUNTER — Encounter: Payer: Self-pay | Admitting: Physical Therapy
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
## 2018-06-28 ENCOUNTER — Ambulatory Visit: Payer: Medicare Other | Admitting: Rehabilitative and Restorative Service Providers"

## 2018-06-28 ENCOUNTER — Encounter: Payer: Self-pay | Admitting: Rehabilitative and Restorative Service Providers"

## 2018-06-28 DIAGNOSIS — M6281 Muscle weakness (generalized): Secondary | ICD-10-CM

## 2018-06-28 DIAGNOSIS — R2681 Unsteadiness on feet: Secondary | ICD-10-CM

## 2018-06-28 DIAGNOSIS — R2689 Other abnormalities of gait and mobility: Secondary | ICD-10-CM
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
## 2018-07-01 ENCOUNTER — Ambulatory Visit: Payer: Medicare Other | Admitting: Rehabilitative and Restorative Service Providers"

## 2018-07-01 ENCOUNTER — Encounter: Payer: Self-pay | Admitting: Rehabilitative and Restorative Service Providers"

## 2018-07-01 DIAGNOSIS — R2681 Unsteadiness on feet: Secondary | ICD-10-CM

## 2018-07-01 DIAGNOSIS — M6281 Muscle weakness (generalized): Secondary | ICD-10-CM | POA: Diagnosis not present

## 2018-07-01 DIAGNOSIS — M545 Low back pain, unspecified: Secondary | ICD-10-CM

## 2018-07-01 DIAGNOSIS — R2689 Other abnormalities of gait and mobility: Secondary | ICD-10-CM

## 2018-07-01 NOTE — Patient Instructions (Signed)
Some areas of focus for personal trainer:  1) hip abductors 2) hip extensors 3) postural stabilization (core and upper back) 4) knee extension 5) ankle strengthening for plantar and dorsiflexion
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
## 2018-07-02 ENCOUNTER — Encounter: Payer: Self-pay | Admitting: Physical Therapy

## 2018-07-02 ENCOUNTER — Ambulatory Visit: Payer: Medicare Other | Admitting: Physical Therapy

## 2018-07-02 DIAGNOSIS — R2689 Other abnormalities of gait and mobility: Secondary | ICD-10-CM

## 2018-07-02 DIAGNOSIS — M6281 Muscle weakness (generalized): Secondary | ICD-10-CM

## 2018-07-02 DIAGNOSIS — M545 Low back pain, unspecified: Secondary | ICD-10-CM

## 2018-07-02 DIAGNOSIS — R2681 Unsteadiness on feet: Secondary | ICD-10-CM
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
## 2018-07-04 ENCOUNTER — Ambulatory Visit: Payer: Medicare Other | Admitting: Physical Therapy

## 2018-07-08 ENCOUNTER — Ambulatory Visit: Payer: Medicare Other | Admitting: Rehabilitative and Restorative Service Providers"

## 2018-07-08 ENCOUNTER — Encounter: Payer: Self-pay | Admitting: Rehabilitative and Restorative Service Providers"

## 2018-07-08 DIAGNOSIS — R2681 Unsteadiness on feet: Secondary | ICD-10-CM

## 2018-07-08 DIAGNOSIS — M545 Low back pain, unspecified: Secondary | ICD-10-CM

## 2018-07-08 DIAGNOSIS — M6281 Muscle weakness (generalized): Secondary | ICD-10-CM

## 2018-07-08 DIAGNOSIS — R2689 Other abnormalities of gait and mobility: Secondary | ICD-10-CM
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
## 2018-07-12 ENCOUNTER — Encounter: Payer: Self-pay | Admitting: Rehabilitative and Restorative Service Providers"

## 2018-07-12 ENCOUNTER — Ambulatory Visit: Payer: Medicare Other | Admitting: Rehabilitative and Restorative Service Providers"

## 2018-07-12 DIAGNOSIS — M6281 Muscle weakness (generalized): Secondary | ICD-10-CM | POA: Diagnosis not present

## 2018-07-12 DIAGNOSIS — R2681 Unsteadiness on feet: Secondary | ICD-10-CM

## 2018-07-12 DIAGNOSIS — R2689 Other abnormalities of gait and mobility: Secondary | ICD-10-CM

## 2018-07-12 DIAGNOSIS — M545 Low back pain, unspecified: Secondary | ICD-10-CM
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
## 2018-07-15 ENCOUNTER — Encounter: Payer: Self-pay | Admitting: Rehabilitative and Restorative Service Providers"

## 2018-07-15 ENCOUNTER — Ambulatory Visit: Payer: Medicare Other | Admitting: Rehabilitative and Restorative Service Providers"

## 2018-07-15 VITALS — BP 121/73 | HR 82

## 2018-07-15 DIAGNOSIS — M545 Low back pain, unspecified: Secondary | ICD-10-CM

## 2018-07-15 DIAGNOSIS — R2689 Other abnormalities of gait and mobility: Secondary | ICD-10-CM

## 2018-07-15 DIAGNOSIS — R2681 Unsteadiness on feet: Secondary | ICD-10-CM

## 2018-07-15 DIAGNOSIS — M6281 Muscle weakness (generalized): Secondary | ICD-10-CM
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
## 2018-07-19 ENCOUNTER — Ambulatory Visit: Payer: Medicare Other | Attending: Internal Medicine | Admitting: Rehabilitative and Restorative Service Providers"

## 2018-07-19 ENCOUNTER — Encounter: Payer: Self-pay | Admitting: Rehabilitative and Restorative Service Providers"

## 2018-07-19 DIAGNOSIS — R2689 Other abnormalities of gait and mobility: Secondary | ICD-10-CM | POA: Diagnosis present

## 2018-07-19 DIAGNOSIS — M545 Low back pain: Secondary | ICD-10-CM | POA: Diagnosis present

## 2018-07-19 DIAGNOSIS — R2681 Unsteadiness on feet: Secondary | ICD-10-CM | POA: Diagnosis present

## 2018-07-19 DIAGNOSIS — M6281 Muscle weakness (generalized): Secondary | ICD-10-CM | POA: Insufficient documentation
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
## 2018-07-22 ENCOUNTER — Ambulatory Visit: Payer: Medicare Other | Admitting: Rehabilitative and Restorative Service Providers"

## 2018-07-22 DIAGNOSIS — R2689 Other abnormalities of gait and mobility: Secondary | ICD-10-CM

## 2018-07-22 DIAGNOSIS — R2681 Unsteadiness on feet: Secondary | ICD-10-CM

## 2018-07-22 DIAGNOSIS — M545 Low back pain, unspecified: Secondary | ICD-10-CM

## 2018-07-22 DIAGNOSIS — M6281 Muscle weakness (generalized): Secondary | ICD-10-CM | POA: Diagnosis not present

## 2018-07-22 NOTE — Patient Instructions (Signed)
Weight Shift: Anterior / Posterior (Righting / Equilibrium)    Slowly shift weight forward, arms back and hips forward over toes, until heels rise off floor. Return to starting position. Shift weight backward, arms forward and hips back over heels, until toes rise off floor. Hold each position __3__ seconds. Repeat __10__ times per session. Do __2__ sessions per day.  Copyright  VHI. All rights reserved.   Functional Quadriceps: Sit to Stand    Sit on edge of chair, feet flat on floor. Stand upright, extending knees fully.*Make sure to use a sturdy chair, have a table or walker in front of you for safety. USE YOUR HANDS AT FIRST. Repeat __5__ times per set. Do _2___ sets per session. Do __2__ sessions per day.  http://orth.exer.us/734  Hip Flexion - Supine    Lying on back, knees bent, feet on floor, bend hips, bringing knees toward trunk. Repeat _10__ times. Do _1-2__ times per day.  Copyright  VHI. All rights reserved.   Pelvic Tilt: Posterior - Legs Bent (Supine)    Tighten stomach and flatten back by rolling pelvis down. Hold _5___ seconds. Relax. Repeat _10___ times per set. Do __1-2__ sessions per day.  http://orth.exer.us/202   Copyright  VHI. All rights reserved.   Chair Sitting    Sit at edge of seat, spine straight, one leg extended. Lean forward, keeping spine straight.  Hold _30__ seconds. Repeat __2_ times per session. Do _1-2__ sessions per day.  Copyright  VHI. All rights reserved.   Feet Apart, Varied Arm Positions - Eyes Closed    Stand with feet shoulder width apart and arms at your sides. Close eyes and visualize upright position. Hold _15-20___ seconds. Repeat _3___ times per session. Do __1__ sessions per day.  Copyright  VHI. All rights reserved.
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
## 2018-07-26 ENCOUNTER — Encounter: Payer: Self-pay | Admitting: Rehabilitative and Restorative Service Providers"

## 2018-07-26 ENCOUNTER — Ambulatory Visit: Payer: Medicare Other | Admitting: Rehabilitative and Restorative Service Providers"

## 2018-07-26 DIAGNOSIS — M6281 Muscle weakness (generalized): Secondary | ICD-10-CM

## 2018-07-26 DIAGNOSIS — R2689 Other abnormalities of gait and mobility: Secondary | ICD-10-CM

## 2018-07-26 DIAGNOSIS — R2681 Unsteadiness on feet: Secondary | ICD-10-CM
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
## 2018-07-31 ENCOUNTER — Ambulatory Visit: Payer: Medicare Other | Admitting: Rehabilitative and Restorative Service Providers"

## 2018-07-31 ENCOUNTER — Encounter: Payer: Self-pay | Admitting: Rehabilitative and Restorative Service Providers"

## 2018-07-31 DIAGNOSIS — M6281 Muscle weakness (generalized): Secondary | ICD-10-CM

## 2018-07-31 DIAGNOSIS — R2689 Other abnormalities of gait and mobility: Secondary | ICD-10-CM

## 2018-07-31 DIAGNOSIS — M545 Low back pain, unspecified: Secondary | ICD-10-CM

## 2018-07-31 DIAGNOSIS — R2681 Unsteadiness on feet: Secondary | ICD-10-CM
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
## 2018-08-09 ENCOUNTER — Encounter

## 2018-08-14 ENCOUNTER — Encounter: Payer: Self-pay | Admitting: Rehabilitative and Restorative Service Providers"

## 2018-08-14 ENCOUNTER — Ambulatory Visit: Payer: Medicare Other | Admitting: Rehabilitative and Restorative Service Providers"

## 2018-08-14 DIAGNOSIS — M6281 Muscle weakness (generalized): Secondary | ICD-10-CM | POA: Diagnosis not present

## 2018-08-14 DIAGNOSIS — R2689 Other abnormalities of gait and mobility: Secondary | ICD-10-CM

## 2018-08-14 DIAGNOSIS — M545 Low back pain, unspecified: Secondary | ICD-10-CM

## 2018-08-14 DIAGNOSIS — R2681 Unsteadiness on feet: Secondary | ICD-10-CM
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
## 2018-08-21 ENCOUNTER — Encounter: Payer: Self-pay | Admitting: Rehabilitative and Restorative Service Providers"

## 2018-08-21 ENCOUNTER — Ambulatory Visit: Payer: Medicare Other | Attending: Internal Medicine | Admitting: Rehabilitative and Restorative Service Providers"

## 2018-08-21 DIAGNOSIS — M545 Low back pain, unspecified: Secondary | ICD-10-CM

## 2018-08-21 DIAGNOSIS — R2689 Other abnormalities of gait and mobility: Secondary | ICD-10-CM | POA: Insufficient documentation

## 2018-08-21 DIAGNOSIS — R2681 Unsteadiness on feet: Secondary | ICD-10-CM | POA: Diagnosis present

## 2018-08-21 DIAGNOSIS — M6281 Muscle weakness (generalized): Secondary | ICD-10-CM
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
## 2018-08-28 ENCOUNTER — Encounter: Payer: Self-pay | Admitting: Physical Therapy

## 2018-08-28 ENCOUNTER — Ambulatory Visit: Payer: Medicare Other | Admitting: Physical Therapy

## 2018-08-28 DIAGNOSIS — M6281 Muscle weakness (generalized): Secondary | ICD-10-CM | POA: Diagnosis not present

## 2018-08-28 DIAGNOSIS — M545 Low back pain, unspecified: Secondary | ICD-10-CM

## 2018-08-28 DIAGNOSIS — R2689 Other abnormalities of gait and mobility: Secondary | ICD-10-CM

## 2018-08-28 DIAGNOSIS — R2681 Unsteadiness on feet: Secondary | ICD-10-CM
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
## 2018-09-04 ENCOUNTER — Encounter: Payer: Medicare Other | Admitting: Rehabilitative and Restorative Service Providers"

## 2018-09-05 ENCOUNTER — Ambulatory Visit: Payer: Medicare Other | Admitting: Rehabilitative and Restorative Service Providers"

## 2018-09-05 ENCOUNTER — Encounter: Payer: Self-pay | Admitting: Rehabilitative and Restorative Service Providers"

## 2018-09-05 DIAGNOSIS — R2689 Other abnormalities of gait and mobility: Secondary | ICD-10-CM

## 2018-09-05 DIAGNOSIS — M6281 Muscle weakness (generalized): Secondary | ICD-10-CM

## 2018-09-05 DIAGNOSIS — R2681 Unsteadiness on feet: Secondary | ICD-10-CM
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
## 2018-09-11 ENCOUNTER — Encounter: Payer: Medicare Other | Admitting: Rehabilitative and Restorative Service Providers"

## 2018-09-18 ENCOUNTER — Encounter: Payer: Medicare Other | Admitting: Rehabilitative and Restorative Service Providers"

## 2018-12-02 ENCOUNTER — Encounter (HOSPITAL_BASED_OUTPATIENT_CLINIC_OR_DEPARTMENT_OTHER): Payer: Medicare Other | Attending: Internal Medicine

## 2018-12-02 DIAGNOSIS — Z8546 Personal history of malignant neoplasm of prostate: Secondary | ICD-10-CM | POA: Insufficient documentation

## 2018-12-02 DIAGNOSIS — Z8551 Personal history of malignant neoplasm of bladder: Secondary | ICD-10-CM | POA: Insufficient documentation

## 2018-12-02 DIAGNOSIS — Z85828 Personal history of other malignant neoplasm of skin: Secondary | ICD-10-CM | POA: Insufficient documentation

## 2018-12-02 DIAGNOSIS — Y838 Other surgical procedures as the cause of abnormal reaction of the patient, or of later complication, without mention of misadventure at the time of the procedure: Secondary | ICD-10-CM | POA: Diagnosis not present

## 2018-12-02 DIAGNOSIS — T8189XA Other complications of procedures, not elsewhere classified, initial encounter: Secondary | ICD-10-CM | POA: Insufficient documentation

## 2018-12-02 DIAGNOSIS — Z87891 Personal history of nicotine dependence: Secondary | ICD-10-CM | POA: Diagnosis not present

## 2018-12-16 DIAGNOSIS — T8189XA Other complications of procedures, not elsewhere classified, initial encounter: Secondary | ICD-10-CM | POA: Diagnosis not present

## 2018-12-24 ENCOUNTER — Ambulatory Visit: Payer: Medicare Other | Admitting: Rheumatology

## 2018-12-30 ENCOUNTER — Encounter (HOSPITAL_BASED_OUTPATIENT_CLINIC_OR_DEPARTMENT_OTHER): Payer: Medicare Other | Attending: Internal Medicine

## 2018-12-30 DIAGNOSIS — T8189XA Other complications of procedures, not elsewhere classified, initial encounter: Secondary | ICD-10-CM | POA: Insufficient documentation

## 2018-12-30 DIAGNOSIS — Z85828 Personal history of other malignant neoplasm of skin: Secondary | ICD-10-CM | POA: Diagnosis not present

## 2018-12-30 DIAGNOSIS — Y838 Other surgical procedures as the cause of abnormal reaction of the patient, or of later complication, without mention of misadventure at the time of the procedure: Secondary | ICD-10-CM | POA: Diagnosis not present

## 2019-01-13 DIAGNOSIS — T8189XA Other complications of procedures, not elsewhere classified, initial encounter: Secondary | ICD-10-CM | POA: Diagnosis not present

## 2019-01-27 ENCOUNTER — Encounter (HOSPITAL_BASED_OUTPATIENT_CLINIC_OR_DEPARTMENT_OTHER): Payer: Medicare Other | Attending: Internal Medicine

## 2019-01-27 DIAGNOSIS — Z85828 Personal history of other malignant neoplasm of skin: Secondary | ICD-10-CM | POA: Insufficient documentation

## 2019-01-27 DIAGNOSIS — Z8546 Personal history of malignant neoplasm of prostate: Secondary | ICD-10-CM | POA: Diagnosis not present

## 2019-01-27 DIAGNOSIS — T8189XA Other complications of procedures, not elsewhere classified, initial encounter: Secondary | ICD-10-CM | POA: Insufficient documentation

## 2019-01-27 DIAGNOSIS — Z8551 Personal history of malignant neoplasm of bladder: Secondary | ICD-10-CM | POA: Diagnosis not present

## 2019-01-27 DIAGNOSIS — Z9079 Acquired absence of other genital organ(s): Secondary | ICD-10-CM | POA: Insufficient documentation

## 2019-01-27 DIAGNOSIS — G629 Polyneuropathy, unspecified: Secondary | ICD-10-CM | POA: Diagnosis not present

## 2019-01-27 DIAGNOSIS — Y838 Other surgical procedures as the cause of abnormal reaction of the patient, or of later complication, without mention of misadventure at the time of the procedure: Secondary | ICD-10-CM | POA: Diagnosis not present

## 2019-02-03 DIAGNOSIS — T8189XA Other complications of procedures, not elsewhere classified, initial encounter: Secondary | ICD-10-CM | POA: Diagnosis not present

## 2019-02-10 DIAGNOSIS — T8189XA Other complications of procedures, not elsewhere classified, initial encounter: Secondary | ICD-10-CM | POA: Diagnosis not present

## 2019-02-24 ENCOUNTER — Encounter (HOSPITAL_BASED_OUTPATIENT_CLINIC_OR_DEPARTMENT_OTHER): Payer: Medicare Other | Attending: Internal Medicine

## 2019-02-24 DIAGNOSIS — Z872 Personal history of diseases of the skin and subcutaneous tissue: Secondary | ICD-10-CM | POA: Diagnosis not present

## 2019-02-24 DIAGNOSIS — Z09 Encounter for follow-up examination after completed treatment for conditions other than malignant neoplasm: Secondary | ICD-10-CM | POA: Diagnosis not present

## 2019-11-11 ENCOUNTER — Ambulatory Visit (INDEPENDENT_AMBULATORY_CARE_PROVIDER_SITE_OTHER): Payer: Medicare Other | Admitting: Rehabilitative and Restorative Service Providers"

## 2019-11-11 ENCOUNTER — Encounter: Payer: Self-pay | Admitting: Rehabilitative and Restorative Service Providers"

## 2019-11-11 ENCOUNTER — Other Ambulatory Visit: Payer: Self-pay

## 2019-11-11 DIAGNOSIS — R2689 Other abnormalities of gait and mobility: Secondary | ICD-10-CM | POA: Diagnosis not present

## 2019-11-11 DIAGNOSIS — M545 Low back pain, unspecified: Secondary | ICD-10-CM

## 2019-11-11 DIAGNOSIS — R2681 Unsteadiness on feet: Secondary | ICD-10-CM

## 2019-11-11 DIAGNOSIS — M6281 Muscle weakness (generalized): Secondary | ICD-10-CM | POA: Diagnosis not present
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
## 2019-11-11 NOTE — Patient Instructions (Signed)
Access Code: [email or web address]  URL: [email or web address]  Date: 11/11/2019  Prepared by: Rudell Cobb   Exercises Supine March - 10 reps - 1 sets - 2x daily - 7x weekly Sit to Stand with Armchair - 5 reps - 1 sets - 2x daily - 7x weekly Gastroc Stretch on Wall - 3 reps - 1 sets - 30 seconds hold - 2x daily - 7x weekly Bent Knee Fallouts with Alternating Legs - 10 reps - 1 sets - 2x daily - 7x weekly

## 2019-11-12 ENCOUNTER — Encounter: Payer: Self-pay | Admitting: Rehabilitative and Restorative Service Providers"

## 2019-11-12 ENCOUNTER — Ambulatory Visit (INDEPENDENT_AMBULATORY_CARE_PROVIDER_SITE_OTHER): Payer: Medicare Other | Admitting: Rehabilitative and Restorative Service Providers"

## 2019-11-12 DIAGNOSIS — R2681 Unsteadiness on feet: Secondary | ICD-10-CM

## 2019-11-12 DIAGNOSIS — M6281 Muscle weakness (generalized): Secondary | ICD-10-CM | POA: Diagnosis not present

## 2019-11-12 DIAGNOSIS — M545 Low back pain, unspecified: Secondary | ICD-10-CM

## 2019-11-12 DIAGNOSIS — R2689 Other abnormalities of gait and mobility: Secondary | ICD-10-CM
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
## 2019-11-18 ENCOUNTER — Encounter: Payer: Self-pay | Admitting: Physical Therapy

## 2019-11-18 ENCOUNTER — Ambulatory Visit (INDEPENDENT_AMBULATORY_CARE_PROVIDER_SITE_OTHER): Payer: Medicare Other | Admitting: Physical Therapy

## 2019-11-18 ENCOUNTER — Other Ambulatory Visit: Payer: Self-pay

## 2019-11-18 DIAGNOSIS — M6281 Muscle weakness (generalized): Secondary | ICD-10-CM | POA: Diagnosis not present

## 2019-11-18 DIAGNOSIS — R2681 Unsteadiness on feet: Secondary | ICD-10-CM

## 2019-11-18 DIAGNOSIS — R2689 Other abnormalities of gait and mobility: Secondary | ICD-10-CM

## 2019-11-18 DIAGNOSIS — M545 Low back pain, unspecified: Secondary | ICD-10-CM
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
## 2019-11-21 ENCOUNTER — Ambulatory Visit (INDEPENDENT_AMBULATORY_CARE_PROVIDER_SITE_OTHER): Payer: Medicare Other | Admitting: Physical Therapy

## 2019-11-21 ENCOUNTER — Encounter: Payer: Self-pay | Admitting: Physical Therapy

## 2019-11-21 ENCOUNTER — Other Ambulatory Visit: Payer: Self-pay

## 2019-11-21 DIAGNOSIS — M545 Low back pain, unspecified: Secondary | ICD-10-CM

## 2019-11-21 DIAGNOSIS — M6281 Muscle weakness (generalized): Secondary | ICD-10-CM | POA: Diagnosis not present

## 2019-11-21 DIAGNOSIS — R2681 Unsteadiness on feet: Secondary | ICD-10-CM

## 2019-11-21 DIAGNOSIS — R2689 Other abnormalities of gait and mobility: Secondary | ICD-10-CM | POA: Diagnosis not present
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
## 2019-11-26 ENCOUNTER — Other Ambulatory Visit: Payer: Self-pay

## 2019-11-26 ENCOUNTER — Encounter: Payer: Self-pay | Admitting: Rehabilitative and Restorative Service Providers"

## 2019-11-26 ENCOUNTER — Ambulatory Visit (INDEPENDENT_AMBULATORY_CARE_PROVIDER_SITE_OTHER): Payer: Medicare Other | Admitting: Rehabilitative and Restorative Service Providers"

## 2019-11-26 DIAGNOSIS — M6281 Muscle weakness (generalized): Secondary | ICD-10-CM

## 2019-11-26 DIAGNOSIS — R262 Difficulty in walking, not elsewhere classified: Secondary | ICD-10-CM

## 2019-11-26 DIAGNOSIS — R2689 Other abnormalities of gait and mobility: Secondary | ICD-10-CM | POA: Diagnosis not present

## 2019-11-26 DIAGNOSIS — M545 Low back pain, unspecified: Secondary | ICD-10-CM

## 2019-11-26 DIAGNOSIS — R2681 Unsteadiness on feet: Secondary | ICD-10-CM
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
## 2019-11-28 ENCOUNTER — Other Ambulatory Visit: Payer: Self-pay | Admitting: Anesthesiology

## 2019-11-28 ENCOUNTER — Encounter: Payer: Self-pay | Admitting: Rehabilitative and Restorative Service Providers"

## 2019-11-28 ENCOUNTER — Ambulatory Visit
Admission: RE | Admit: 2019-11-28 | Discharge: 2019-11-28 | Disposition: A | Discharge status: Home / Self Care | Payer: Medicare Other | Source: Ambulatory Visit | Attending: Anesthesiology | Admitting: Anesthesiology

## 2019-11-28 ENCOUNTER — Other Ambulatory Visit: Payer: Self-pay

## 2019-11-28 ENCOUNTER — Ambulatory Visit (INDEPENDENT_AMBULATORY_CARE_PROVIDER_SITE_OTHER): Payer: Medicare Other | Admitting: Rehabilitative and Restorative Service Providers"

## 2019-11-28 DIAGNOSIS — M545 Low back pain, unspecified: Secondary | ICD-10-CM

## 2019-11-28 DIAGNOSIS — M6281 Muscle weakness (generalized): Secondary | ICD-10-CM | POA: Diagnosis not present

## 2019-11-28 DIAGNOSIS — R2681 Unsteadiness on feet: Secondary | ICD-10-CM

## 2019-11-28 DIAGNOSIS — R2689 Other abnormalities of gait and mobility: Secondary | ICD-10-CM | POA: Diagnosis not present

## 2019-11-28 NOTE — Patient Instructions (Signed)
Access Code: [email or web address]  URL: [email or web address]  Date: 11/28/2019  Prepared by: Rudell Cobb   Exercises Supine March - 10 reps - 1 sets - 2x daily - 7x weekly Sit to Stand with Armchair - 5 reps - 1 sets - 2x daily - 7x weekly Gastroc Stretch on Wall - 3 reps - 1 sets - 30 seconds hold - 2x daily - 7x weekly Bent Knee Fallouts with Alternating Legs - 10 reps - 1 sets - 2x daily - 7x weekly Supine Straight Leg Raises - 5 reps - 1 sets - 2x daily - 7x weekly
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
## 2019-12-01 ENCOUNTER — Other Ambulatory Visit: Payer: Self-pay

## 2019-12-01 ENCOUNTER — Ambulatory Visit (INDEPENDENT_AMBULATORY_CARE_PROVIDER_SITE_OTHER): Payer: Medicare Other | Admitting: Physical Therapy

## 2019-12-01 DIAGNOSIS — R2681 Unsteadiness on feet: Secondary | ICD-10-CM

## 2019-12-01 DIAGNOSIS — M545 Low back pain, unspecified: Secondary | ICD-10-CM

## 2019-12-01 DIAGNOSIS — R2689 Other abnormalities of gait and mobility: Secondary | ICD-10-CM

## 2019-12-01 DIAGNOSIS — M6281 Muscle weakness (generalized): Secondary | ICD-10-CM
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
## 2019-12-04 ENCOUNTER — Other Ambulatory Visit: Payer: Self-pay

## 2019-12-04 ENCOUNTER — Ambulatory Visit (INDEPENDENT_AMBULATORY_CARE_PROVIDER_SITE_OTHER): Payer: Medicare Other | Admitting: Rehabilitative and Restorative Service Providers"

## 2019-12-04 DIAGNOSIS — R2689 Other abnormalities of gait and mobility: Secondary | ICD-10-CM

## 2019-12-04 DIAGNOSIS — R2681 Unsteadiness on feet: Secondary | ICD-10-CM

## 2019-12-04 DIAGNOSIS — R29898 Other symptoms and signs involving the musculoskeletal system: Secondary | ICD-10-CM

## 2019-12-04 DIAGNOSIS — R262 Difficulty in walking, not elsewhere classified: Secondary | ICD-10-CM

## 2019-12-04 DIAGNOSIS — M545 Low back pain, unspecified: Secondary | ICD-10-CM

## 2019-12-04 DIAGNOSIS — M6281 Muscle weakness (generalized): Secondary | ICD-10-CM
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
## 2019-12-08 ENCOUNTER — Other Ambulatory Visit: Payer: Self-pay

## 2019-12-08 ENCOUNTER — Ambulatory Visit (INDEPENDENT_AMBULATORY_CARE_PROVIDER_SITE_OTHER): Payer: Medicare Other | Admitting: Rehabilitative and Restorative Service Providers"

## 2019-12-08 DIAGNOSIS — M545 Low back pain, unspecified: Secondary | ICD-10-CM

## 2019-12-08 DIAGNOSIS — R2681 Unsteadiness on feet: Secondary | ICD-10-CM | POA: Diagnosis not present

## 2019-12-08 DIAGNOSIS — M6281 Muscle weakness (generalized): Secondary | ICD-10-CM | POA: Diagnosis not present

## 2019-12-08 DIAGNOSIS — R2689 Other abnormalities of gait and mobility: Secondary | ICD-10-CM
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
## 2019-12-10 ENCOUNTER — Other Ambulatory Visit: Payer: Self-pay

## 2019-12-10 ENCOUNTER — Ambulatory Visit (INDEPENDENT_AMBULATORY_CARE_PROVIDER_SITE_OTHER): Payer: Medicare Other | Admitting: Rehabilitative and Restorative Service Providers"

## 2019-12-10 ENCOUNTER — Encounter: Payer: Self-pay | Admitting: Rehabilitative and Restorative Service Providers"

## 2019-12-10 DIAGNOSIS — R2681 Unsteadiness on feet: Secondary | ICD-10-CM

## 2019-12-10 DIAGNOSIS — M6281 Muscle weakness (generalized): Secondary | ICD-10-CM

## 2019-12-10 DIAGNOSIS — R2689 Other abnormalities of gait and mobility: Secondary | ICD-10-CM

## 2019-12-10 DIAGNOSIS — R29898 Other symptoms and signs involving the musculoskeletal system: Secondary | ICD-10-CM

## 2019-12-10 DIAGNOSIS — M545 Low back pain, unspecified: Secondary | ICD-10-CM

## 2019-12-10 DIAGNOSIS — R262 Difficulty in walking, not elsewhere classified: Secondary | ICD-10-CM

## 2019-12-10 NOTE — Patient Instructions (Signed)
Access Code: [email or web address]  URL: [email or web address]  Date: 12/10/2019  Prepared by: Rudell Cobb   Exercises Supine March - 10 reps - 1 sets - 2x daily - 7x weekly Sit to Stand with Armchair - 5 reps - 1 sets - 2x daily - 7x weekly Seated Hamstring Stretch - 3 reps - 1 sets - 30 seconds hold - 2x daily - 7x weekly Gastroc Stretch on Wall - 3 reps - 1 sets - 30 seconds hold - 2x daily - 7x weekly Bent Knee Fallouts with Alternating Legs - 10 reps - 1 sets - 2x daily - 7x weekly Supine Straight Leg Raises - 5 reps - 1 sets - 2x daily - 7x weekly
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
## 2019-12-16 ENCOUNTER — Encounter: Payer: Self-pay | Admitting: Rehabilitative and Restorative Service Providers"

## 2019-12-16 ENCOUNTER — Other Ambulatory Visit: Payer: Self-pay

## 2019-12-16 ENCOUNTER — Ambulatory Visit (INDEPENDENT_AMBULATORY_CARE_PROVIDER_SITE_OTHER): Payer: Medicare Other | Admitting: Rehabilitative and Restorative Service Providers"

## 2019-12-16 DIAGNOSIS — M545 Low back pain, unspecified: Secondary | ICD-10-CM

## 2019-12-16 DIAGNOSIS — M6281 Muscle weakness (generalized): Secondary | ICD-10-CM

## 2019-12-16 DIAGNOSIS — R2689 Other abnormalities of gait and mobility: Secondary | ICD-10-CM

## 2019-12-16 DIAGNOSIS — R2681 Unsteadiness on feet: Secondary | ICD-10-CM | POA: Diagnosis not present
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
## 2019-12-18 ENCOUNTER — Ambulatory Visit (INDEPENDENT_AMBULATORY_CARE_PROVIDER_SITE_OTHER): Payer: Medicare Other | Admitting: Rehabilitative and Restorative Service Providers"

## 2019-12-18 ENCOUNTER — Other Ambulatory Visit: Payer: Self-pay

## 2019-12-18 VITALS — BP 131/67 | HR 83

## 2019-12-18 DIAGNOSIS — M6281 Muscle weakness (generalized): Secondary | ICD-10-CM | POA: Diagnosis not present

## 2019-12-18 DIAGNOSIS — M545 Low back pain, unspecified: Secondary | ICD-10-CM

## 2019-12-18 DIAGNOSIS — R2681 Unsteadiness on feet: Secondary | ICD-10-CM

## 2019-12-18 DIAGNOSIS — R2689 Other abnormalities of gait and mobility: Secondary | ICD-10-CM | POA: Diagnosis not present

## 2019-12-18 NOTE — Patient Instructions (Signed)
Access Code: [email or web address]  URL: [email or web address]  Date: 12/18/2019  Prepared by: Rudell Cobb   Exercises Supine March - 10 reps - 1 sets - 2x daily - 7x weekly Bent Knee Fallouts with Alternating Legs - 10 reps - 1 sets - 2x daily - 7x weekly Supine Straight Leg Raises - 5 reps - 1 sets - 2x daily - 7x weekly Seated Hamstring Stretch - 3 reps - 1 sets - 30 seconds hold - 2x daily - 7x weekly Sit to Stand with Armchair - 5 reps - 1 sets - 2x daily - 7x weekly Gastroc Stretch on Wall - 3 reps - 1 sets - 30 seconds hold - 2x daily - 7x weekly Standing Anterior Posterior Weight Shift - 10 reps - 1 sets - 2x daily - 7x weekly Standing Heel Raises - 15 reps - 1 sets - 2x daily - 7x weekly
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
## 2019-12-23 ENCOUNTER — Ambulatory Visit (INDEPENDENT_AMBULATORY_CARE_PROVIDER_SITE_OTHER): Payer: Medicare Other | Admitting: Rehabilitative and Restorative Service Providers"

## 2019-12-23 ENCOUNTER — Other Ambulatory Visit: Payer: Self-pay

## 2019-12-23 ENCOUNTER — Encounter: Payer: Self-pay | Admitting: Rehabilitative and Restorative Service Providers"

## 2019-12-23 DIAGNOSIS — R2689 Other abnormalities of gait and mobility: Secondary | ICD-10-CM | POA: Diagnosis not present

## 2019-12-23 DIAGNOSIS — M545 Low back pain, unspecified: Secondary | ICD-10-CM

## 2019-12-23 DIAGNOSIS — M6281 Muscle weakness (generalized): Secondary | ICD-10-CM

## 2019-12-23 DIAGNOSIS — R2681 Unsteadiness on feet: Secondary | ICD-10-CM | POA: Diagnosis not present
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
## 2019-12-26 ENCOUNTER — Other Ambulatory Visit: Payer: Self-pay

## 2019-12-26 ENCOUNTER — Encounter: Payer: Self-pay | Admitting: Rehabilitative and Restorative Service Providers"

## 2019-12-26 ENCOUNTER — Ambulatory Visit (INDEPENDENT_AMBULATORY_CARE_PROVIDER_SITE_OTHER): Payer: Medicare Other | Admitting: Rehabilitative and Restorative Service Providers"

## 2019-12-26 DIAGNOSIS — M545 Low back pain, unspecified: Secondary | ICD-10-CM

## 2019-12-26 DIAGNOSIS — M6281 Muscle weakness (generalized): Secondary | ICD-10-CM

## 2019-12-26 DIAGNOSIS — R2689 Other abnormalities of gait and mobility: Secondary | ICD-10-CM | POA: Diagnosis not present

## 2019-12-26 DIAGNOSIS — R2681 Unsteadiness on feet: Secondary | ICD-10-CM
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
## 2019-12-30 ENCOUNTER — Encounter: Payer: Medicare Other | Admitting: Rehabilitative and Restorative Service Providers"

## 2019-12-30 ENCOUNTER — Ambulatory Visit: Payer: Medicare Other | Attending: Internal Medicine

## 2019-12-30 DIAGNOSIS — Z23 Encounter for immunization: Secondary | ICD-10-CM | POA: Insufficient documentation

## 2019-12-30 NOTE — Progress Notes (Signed)
   Covid-19 Vaccination Clinic  Name:  [name]    MRN: [id number] DOB: [birth year]  12/30/2019  [name] was observed post Covid-19 immunization for 15 minutes without incidence. He was provided with Vaccine Information Sheet and instruction to access the V-Safe system.   [name] was instructed to call 911 with any severe reactions post vaccine: Marland Kitchen Difficulty breathing  . Swelling of your face and throat  . A fast heartbeat  . A bad rash all over your body  . Dizziness and weakness    Immunizations Administered    Name Date Dose VIS Date Route   Pfizer COVID-19 Vaccine 12/30/2019 12:57 PM 0.3 mL 11/28/2019 Intramuscular   Manufacturer: [place]   Lot: [id number]   [place]: [id number]

## 2020-01-01 ENCOUNTER — Ambulatory Visit (INDEPENDENT_AMBULATORY_CARE_PROVIDER_SITE_OTHER): Payer: Medicare Other | Admitting: Rehabilitative and Restorative Service Providers"

## 2020-01-01 ENCOUNTER — Other Ambulatory Visit: Payer: Self-pay

## 2020-01-01 ENCOUNTER — Encounter: Payer: Self-pay | Admitting: Rehabilitative and Restorative Service Providers"

## 2020-01-01 DIAGNOSIS — M6281 Muscle weakness (generalized): Secondary | ICD-10-CM | POA: Diagnosis not present

## 2020-01-01 DIAGNOSIS — M545 Low back pain, unspecified: Secondary | ICD-10-CM

## 2020-01-01 DIAGNOSIS — R2681 Unsteadiness on feet: Secondary | ICD-10-CM

## 2020-01-01 DIAGNOSIS — R2689 Other abnormalities of gait and mobility: Secondary | ICD-10-CM | POA: Diagnosis not present
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
## 2020-01-10 ENCOUNTER — Ambulatory Visit: Payer: Medicare Other

## 2020-01-19 ENCOUNTER — Ambulatory Visit: Payer: Medicare Other | Attending: Internal Medicine

## 2020-01-19 DIAGNOSIS — Z23 Encounter for immunization: Secondary | ICD-10-CM | POA: Insufficient documentation

## 2020-01-19 NOTE — Progress Notes (Signed)
   Covid-19 Vaccination Clinic  Name:  [name]    MRN: [id number] DOB: 1936-07-13  01/19/2020  [name] was observed post Covid-19 immunization for 15 minutes without incidence. He was provided with Vaccine Information Sheet and instruction to access the V-Safe system.   [name] was instructed to call 911 with any severe reactions post vaccine: Marland Kitchen Difficulty breathing  . Swelling of your face and throat  . A fast heartbeat  . A bad rash all over your body  . Dizziness and weakness    Immunizations Administered    Name Date Dose VIS Date Route   Pfizer COVID-19 Vaccine 01/19/2020  9:42 AM 0.3 mL 11/28/2019 Intramuscular   Manufacturer: [place]   Lot: [id number]   [place]: [id number]

## 2020-05-27 IMAGING — CR DG LUMBAR SPINE COMPLETE W/ BEND
7 series · 7 of 7 positions shown · non-contrast
Comparison: CT L-spine 11/28/2019

CLINICAL DATA: Chronic lower back pain, left side

EXAM:
LUMBAR SPINE - COMPLETE WITH BENDING VIEWS

[t lumbar spine ap]
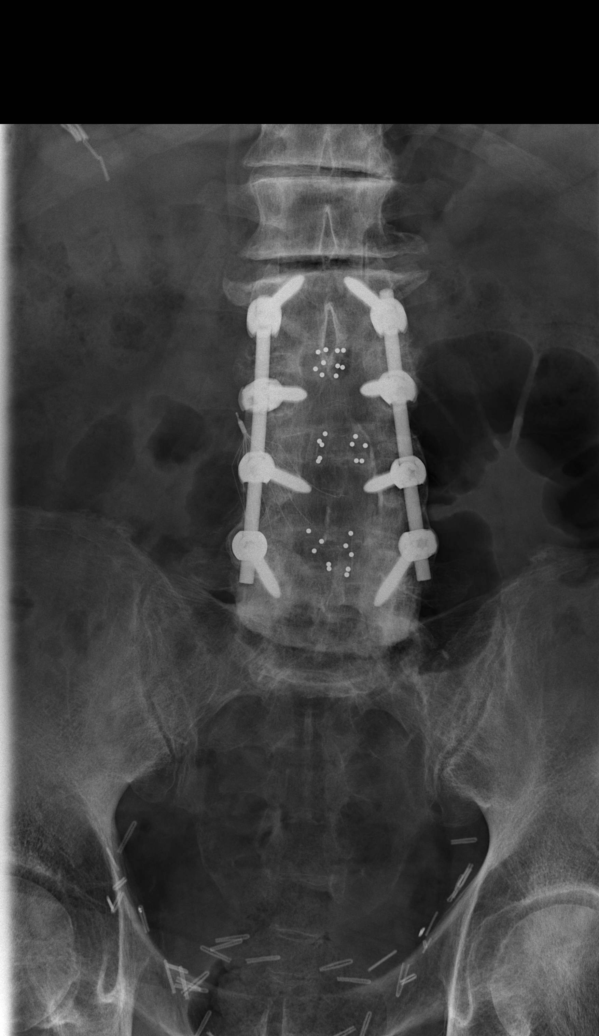

[t lumbar spine obl (1 of 2)]
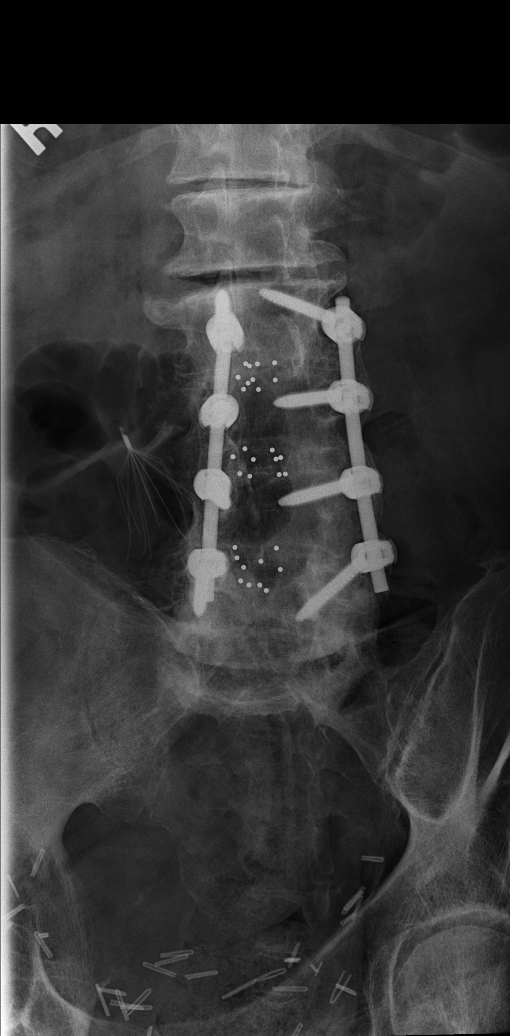

[t lumbar spine obl (2 of 2)]
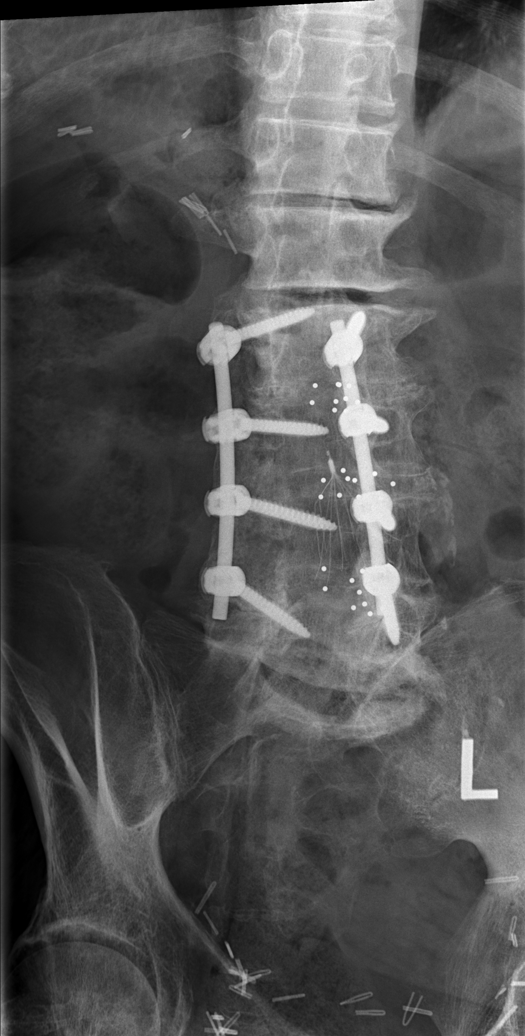

[w lumbar spine lat]
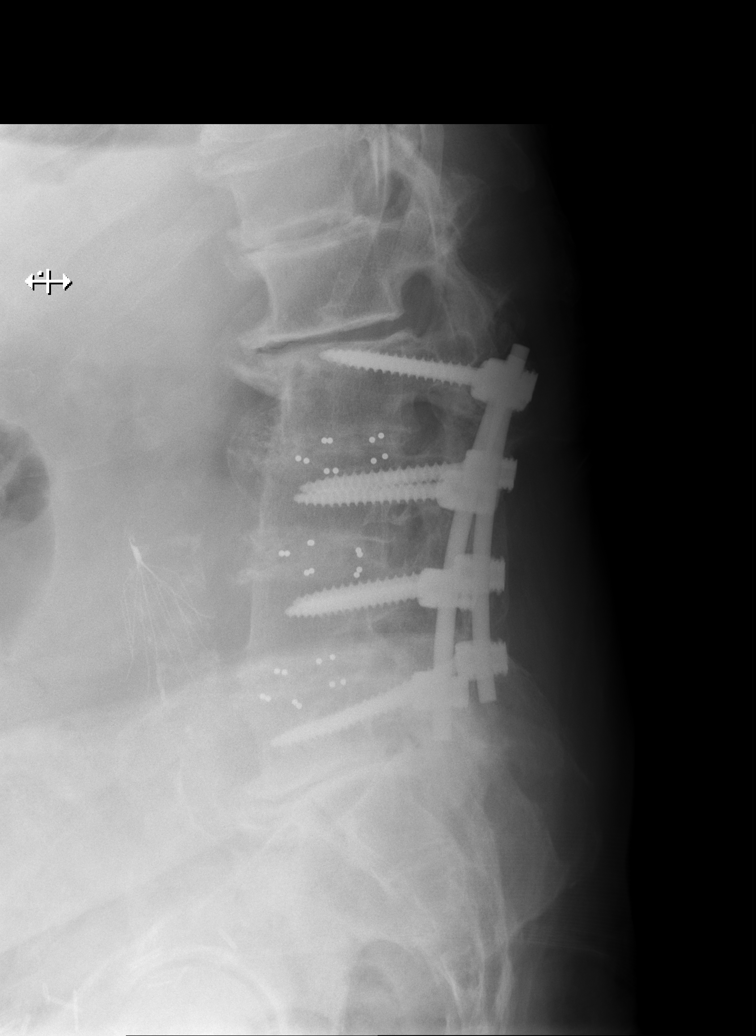

[w lumbar spine flexion]
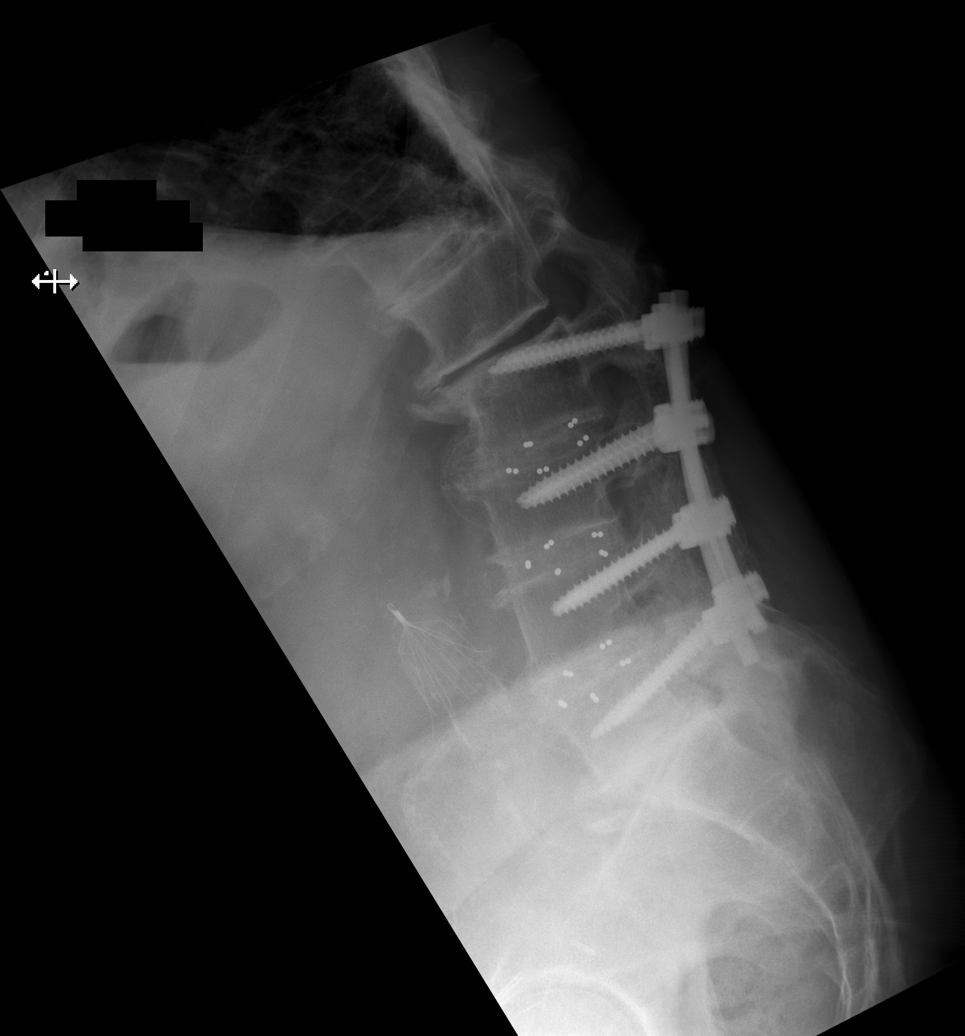

[w lumbar spine extension]
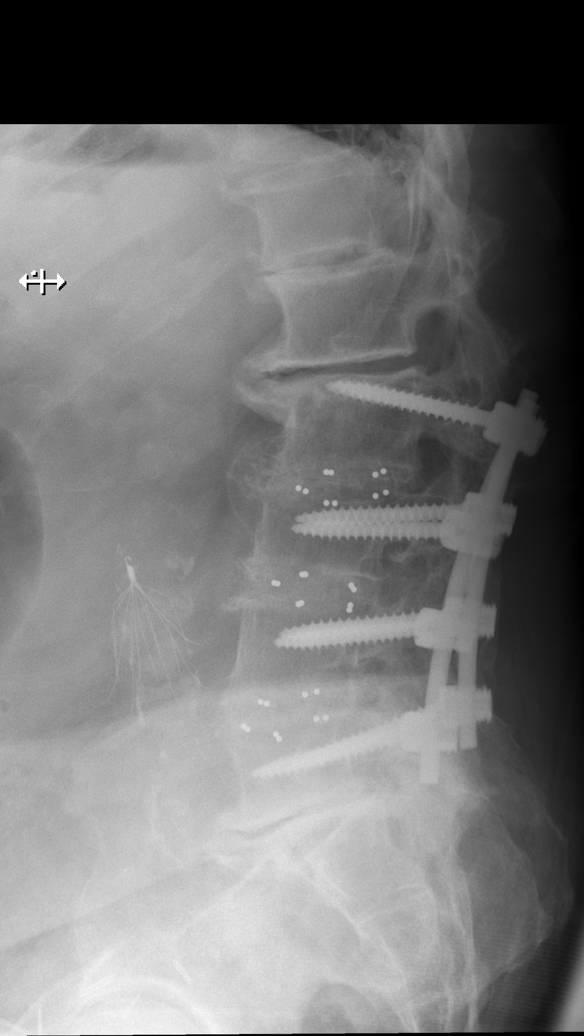

[w lumbar l-5 s-1 spot]
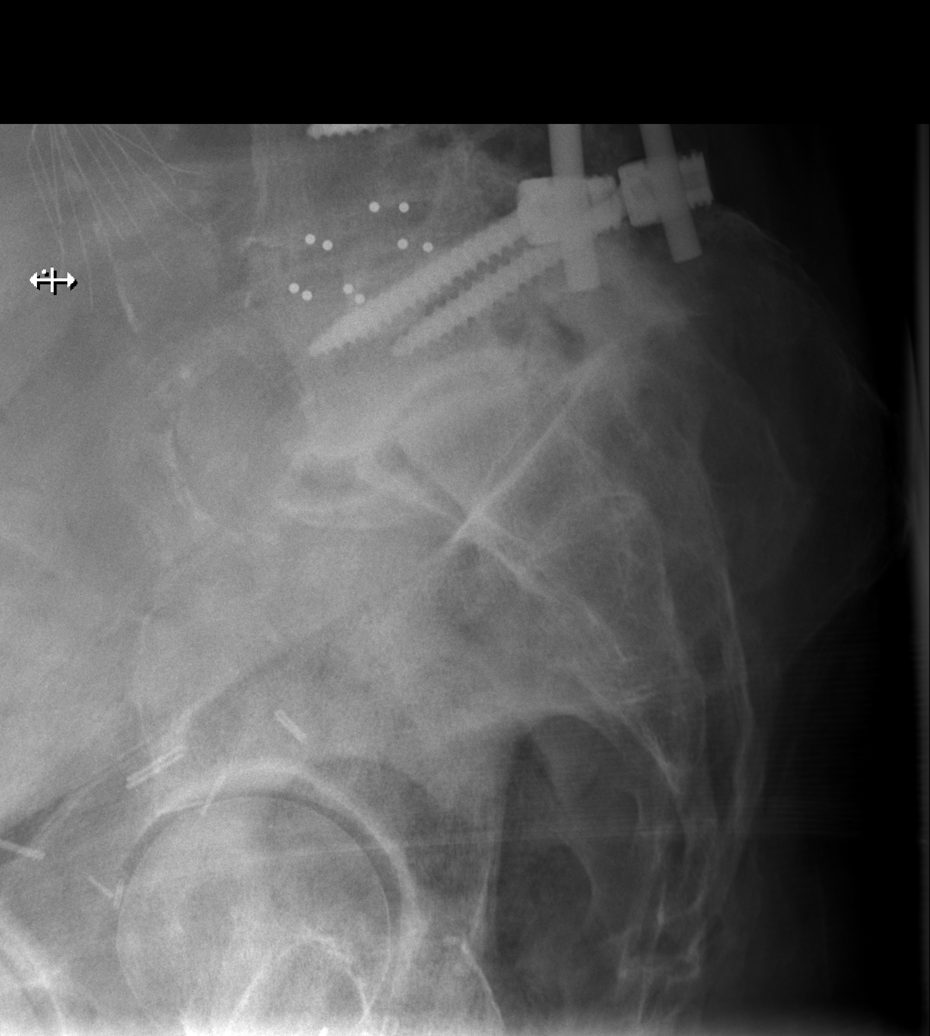

[7 of 7 positions shown; findings below may reference images not displayed]

FINDINGS: Postsurgical changes from prior L2-L5 posterior spinal fusion with
interbody spacer placement demonstrating intact hardware without
evidence of failure or loosening with good bony incorporation of the
intervertebral spacers. There is a grade 1 anterolisthesis of T12 on
L1 which appears stable on both flexion and extension views.
Additional focal kyphotic curvature at the L2-3 level with some
vacuum disc phenomenon also remains stable on flexion extension
views. No acute fracture or compression deformity is seen.
Multilevel degenerative changes are present in the imaged portions
of the spine, most pronounced at L5-S1. Inferior vena cava filter is
noted as well as surgical clips in the right upper quadrant and low
pelvis.
IMPRESSION: 1. No acute osseous abnormality. No evidence of dynamic instability.
2. Stable postoperative changes from prior L2-L5 posterior spinal
fusion without hardware complication.
3. Grade 1 anterolisthesis of T12 on L1.
4. Focal kyphotic curvature at the L2-3 level with vacuum disc
phenomenon.
5. Multilevel degenerative changes most pronounced at L5-S1.

## 2020-05-31 ENCOUNTER — Other Ambulatory Visit: Payer: Self-pay

## 2020-05-31 ENCOUNTER — Other Ambulatory Visit (HOSPITAL_COMMUNITY): Payer: Self-pay | Admitting: Internal Medicine

## 2020-05-31 ENCOUNTER — Ambulatory Visit (HOSPITAL_COMMUNITY)
Admission: RE | Admit: 2020-05-31 | Discharge: 2020-05-31 | Disposition: A | Discharge status: Home / Self Care | Payer: Medicare Other | Source: Ambulatory Visit | Attending: Internal Medicine | Admitting: Internal Medicine

## 2020-05-31 DIAGNOSIS — M7989 Other specified soft tissue disorders: Secondary | ICD-10-CM | POA: Insufficient documentation

## 2020-06-03 ENCOUNTER — Ambulatory Visit: Payer: Medicare Other | Admitting: Rehabilitative and Restorative Service Providers"

## 2020-06-07 ENCOUNTER — Other Ambulatory Visit: Payer: Self-pay

## 2020-06-07 ENCOUNTER — Ambulatory Visit (INDEPENDENT_AMBULATORY_CARE_PROVIDER_SITE_OTHER): Payer: Medicare Other | Admitting: Rehabilitative and Restorative Service Providers"

## 2020-06-07 DIAGNOSIS — M6281 Muscle weakness (generalized): Secondary | ICD-10-CM | POA: Diagnosis not present

## 2020-06-07 DIAGNOSIS — R2689 Other abnormalities of gait and mobility: Secondary | ICD-10-CM

## 2020-06-07 DIAGNOSIS — R2681 Unsteadiness on feet: Secondary | ICD-10-CM

## 2020-06-07 DIAGNOSIS — M545 Low back pain, unspecified: Secondary | ICD-10-CM
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
## 2020-06-07 NOTE — Patient Instructions (Signed)
Access Code: [email or web address] URL: [email or web address] Date: 06/07/2020 Prepared by: Rudell Cobb  Exercises Seated Hamstring Stretch - 2 x daily - 7 x weekly - 3 reps - 1 sets - 30 seconds hold Sit to Stand with Armchair - 2 x daily - 7 x weekly - 5 reps - 1 sets Standing Anterior Posterior Weight Shift - 2 x daily - 7 x weekly - 10 reps - 1 sets Standing Heel Raises - 2 x daily - 7 x weekly - 10 reps - 1 sets

## 2020-06-09 ENCOUNTER — Other Ambulatory Visit: Payer: Self-pay

## 2020-06-09 ENCOUNTER — Encounter: Payer: Self-pay | Admitting: Rehabilitative and Restorative Service Providers"

## 2020-06-09 ENCOUNTER — Ambulatory Visit (INDEPENDENT_AMBULATORY_CARE_PROVIDER_SITE_OTHER): Payer: Medicare Other | Admitting: Rehabilitative and Restorative Service Providers"

## 2020-06-09 DIAGNOSIS — R2681 Unsteadiness on feet: Secondary | ICD-10-CM | POA: Diagnosis not present

## 2020-06-09 DIAGNOSIS — M6281 Muscle weakness (generalized): Secondary | ICD-10-CM

## 2020-06-09 DIAGNOSIS — R2689 Other abnormalities of gait and mobility: Secondary | ICD-10-CM

## 2020-06-09 DIAGNOSIS — M545 Low back pain, unspecified: Secondary | ICD-10-CM
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
## 2020-06-09 NOTE — Patient Instructions (Signed)
Access Code: [email or web address] URL: [email or web address] Date: 06/09/2020 Prepared by: Rudell Cobb  Exercises Seated Hamstring Stretch - 2 x daily - 7 x weekly - 3 reps - 1 sets - 30 seconds hold Seated Long Arc Quad - 2 x daily - 7 x weekly - 1 sets - 10 reps Sit to Stand with Armchair - 2 x daily - 7 x weekly - 5 reps - 1 sets Standing Anterior Posterior Weight Shift - 2 x daily - 7 x weekly - 10 reps - 1 sets Standing Heel Raises - 2 x daily - 7 x weekly - 10 reps - 1 sets

## 2020-06-11 ENCOUNTER — Encounter: Payer: Medicare Other | Admitting: Rehabilitative and Restorative Service Providers"

## 2020-06-14 ENCOUNTER — Other Ambulatory Visit: Payer: Self-pay

## 2020-06-14 ENCOUNTER — Encounter: Payer: Self-pay | Admitting: Rehabilitative and Restorative Service Providers"

## 2020-06-14 ENCOUNTER — Ambulatory Visit (INDEPENDENT_AMBULATORY_CARE_PROVIDER_SITE_OTHER): Payer: Medicare Other | Admitting: Rehabilitative and Restorative Service Providers"

## 2020-06-14 DIAGNOSIS — M6281 Muscle weakness (generalized): Secondary | ICD-10-CM

## 2020-06-14 DIAGNOSIS — M545 Low back pain, unspecified: Secondary | ICD-10-CM

## 2020-06-14 DIAGNOSIS — R2689 Other abnormalities of gait and mobility: Secondary | ICD-10-CM

## 2020-06-14 DIAGNOSIS — R2681 Unsteadiness on feet: Secondary | ICD-10-CM | POA: Diagnosis not present
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
## 2020-06-16 ENCOUNTER — Ambulatory Visit (INDEPENDENT_AMBULATORY_CARE_PROVIDER_SITE_OTHER): Payer: Medicare Other | Admitting: Rehabilitative and Restorative Service Providers"

## 2020-06-16 ENCOUNTER — Other Ambulatory Visit: Payer: Self-pay

## 2020-06-16 DIAGNOSIS — R2689 Other abnormalities of gait and mobility: Secondary | ICD-10-CM | POA: Diagnosis not present

## 2020-06-16 DIAGNOSIS — M545 Low back pain, unspecified: Secondary | ICD-10-CM

## 2020-06-16 DIAGNOSIS — M6281 Muscle weakness (generalized): Secondary | ICD-10-CM

## 2020-06-16 DIAGNOSIS — R2681 Unsteadiness on feet: Secondary | ICD-10-CM | POA: Diagnosis not present
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
## 2020-06-18 ENCOUNTER — Encounter: Payer: Medicare Other | Admitting: Rehabilitative and Restorative Service Providers"

## 2020-06-23 ENCOUNTER — Ambulatory Visit (INDEPENDENT_AMBULATORY_CARE_PROVIDER_SITE_OTHER): Payer: Medicare Other | Admitting: Physical Therapy

## 2020-06-23 ENCOUNTER — Other Ambulatory Visit: Payer: Self-pay

## 2020-06-23 ENCOUNTER — Encounter: Payer: Self-pay | Admitting: Physical Therapy

## 2020-06-23 DIAGNOSIS — R2681 Unsteadiness on feet: Secondary | ICD-10-CM | POA: Diagnosis not present

## 2020-06-23 DIAGNOSIS — M545 Low back pain, unspecified: Secondary | ICD-10-CM

## 2020-06-23 DIAGNOSIS — M6281 Muscle weakness (generalized): Secondary | ICD-10-CM | POA: Diagnosis not present

## 2020-06-23 DIAGNOSIS — R2689 Other abnormalities of gait and mobility: Secondary | ICD-10-CM | POA: Diagnosis not present
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
## 2020-06-24 ENCOUNTER — Ambulatory Visit (INDEPENDENT_AMBULATORY_CARE_PROVIDER_SITE_OTHER): Payer: Medicare Other | Admitting: Rehabilitative and Restorative Service Providers"

## 2020-06-24 DIAGNOSIS — M6281 Muscle weakness (generalized): Secondary | ICD-10-CM | POA: Diagnosis not present

## 2020-06-24 DIAGNOSIS — R2681 Unsteadiness on feet: Secondary | ICD-10-CM

## 2020-06-24 DIAGNOSIS — M545 Low back pain, unspecified: Secondary | ICD-10-CM

## 2020-06-24 DIAGNOSIS — R2689 Other abnormalities of gait and mobility: Secondary | ICD-10-CM | POA: Diagnosis not present
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
## 2020-06-28 ENCOUNTER — Ambulatory Visit (INDEPENDENT_AMBULATORY_CARE_PROVIDER_SITE_OTHER): Payer: Medicare Other | Admitting: Rehabilitative and Restorative Service Providers"

## 2020-06-28 ENCOUNTER — Other Ambulatory Visit: Payer: Self-pay

## 2020-06-28 ENCOUNTER — Encounter: Payer: Self-pay | Admitting: Rehabilitative and Restorative Service Providers"

## 2020-06-28 DIAGNOSIS — R2681 Unsteadiness on feet: Secondary | ICD-10-CM | POA: Diagnosis not present

## 2020-06-28 DIAGNOSIS — M6281 Muscle weakness (generalized): Secondary | ICD-10-CM

## 2020-06-28 DIAGNOSIS — R2689 Other abnormalities of gait and mobility: Secondary | ICD-10-CM | POA: Diagnosis not present

## 2020-06-28 DIAGNOSIS — M545 Low back pain, unspecified: Secondary | ICD-10-CM
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
## 2020-06-30 ENCOUNTER — Other Ambulatory Visit: Payer: Self-pay

## 2020-06-30 ENCOUNTER — Ambulatory Visit (INDEPENDENT_AMBULATORY_CARE_PROVIDER_SITE_OTHER): Payer: Medicare Other | Admitting: Rehabilitative and Restorative Service Providers"

## 2020-06-30 DIAGNOSIS — R2689 Other abnormalities of gait and mobility: Secondary | ICD-10-CM | POA: Diagnosis not present

## 2020-06-30 DIAGNOSIS — M545 Low back pain, unspecified: Secondary | ICD-10-CM

## 2020-06-30 DIAGNOSIS — R2681 Unsteadiness on feet: Secondary | ICD-10-CM

## 2020-06-30 DIAGNOSIS — M6281 Muscle weakness (generalized): Secondary | ICD-10-CM | POA: Diagnosis not present
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
## 2020-07-02 ENCOUNTER — Encounter: Payer: Medicare Other | Admitting: Rehabilitative and Restorative Service Providers"

## 2020-07-05 ENCOUNTER — Other Ambulatory Visit: Payer: Self-pay

## 2020-07-05 ENCOUNTER — Ambulatory Visit (INDEPENDENT_AMBULATORY_CARE_PROVIDER_SITE_OTHER): Payer: Medicare Other | Admitting: Physical Therapy

## 2020-07-05 DIAGNOSIS — M545 Low back pain, unspecified: Secondary | ICD-10-CM

## 2020-07-05 DIAGNOSIS — R2689 Other abnormalities of gait and mobility: Secondary | ICD-10-CM

## 2020-07-05 DIAGNOSIS — M6281 Muscle weakness (generalized): Secondary | ICD-10-CM | POA: Diagnosis not present

## 2020-07-05 DIAGNOSIS — R2681 Unsteadiness on feet: Secondary | ICD-10-CM
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
## 2020-07-07 ENCOUNTER — Other Ambulatory Visit: Payer: Self-pay

## 2020-07-07 ENCOUNTER — Ambulatory Visit (INDEPENDENT_AMBULATORY_CARE_PROVIDER_SITE_OTHER): Payer: Medicare Other | Admitting: Physical Therapy

## 2020-07-07 DIAGNOSIS — R2681 Unsteadiness on feet: Secondary | ICD-10-CM

## 2020-07-07 DIAGNOSIS — M6281 Muscle weakness (generalized): Secondary | ICD-10-CM | POA: Diagnosis not present

## 2020-07-07 DIAGNOSIS — R2689 Other abnormalities of gait and mobility: Secondary | ICD-10-CM
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
## 2020-07-12 ENCOUNTER — Ambulatory Visit (INDEPENDENT_AMBULATORY_CARE_PROVIDER_SITE_OTHER): Payer: Medicare Other | Admitting: Rehabilitative and Restorative Service Providers"

## 2020-07-12 ENCOUNTER — Other Ambulatory Visit: Payer: Self-pay

## 2020-07-12 DIAGNOSIS — R2689 Other abnormalities of gait and mobility: Secondary | ICD-10-CM | POA: Diagnosis not present

## 2020-07-12 DIAGNOSIS — M6281 Muscle weakness (generalized): Secondary | ICD-10-CM

## 2020-07-12 DIAGNOSIS — M545 Low back pain, unspecified: Secondary | ICD-10-CM

## 2020-07-12 DIAGNOSIS — R2681 Unsteadiness on feet: Secondary | ICD-10-CM | POA: Diagnosis not present
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
## 2020-07-12 NOTE — Patient Instructions (Signed)
Access Code: [email or web address] URL: [email or web address] Date: 07/12/2020 Prepared by: Rudell Cobb  Exercises Seated Hamstring Stretch - 2 x daily - 7 x weekly - 3 reps - 1 sets - 30 seconds hold Seated Long Arc Quad - 2 x daily - 7 x weekly - 1 sets - 10 reps Sit to Stand with Armchair - 2 x daily - 7 x weekly - 5 reps - 1 sets Standing Anterior Posterior Weight Shift - 2 x daily - 7 x weekly - 10 reps - 1 sets Standing Heel Raises - 2 x daily - 7 x weekly - 20 reps - 1 sets Mini Squat with Counter Support - 2 x daily - 7 x weekly - 1 sets - 10 reps Side Stepping with Counter Support - 2 x daily - 7 x weekly - 1 sets - 10 reps

## 2020-07-14 ENCOUNTER — Other Ambulatory Visit: Payer: Self-pay

## 2020-07-14 ENCOUNTER — Ambulatory Visit (INDEPENDENT_AMBULATORY_CARE_PROVIDER_SITE_OTHER): Payer: Medicare Other | Admitting: Physical Therapy

## 2020-07-14 DIAGNOSIS — R2681 Unsteadiness on feet: Secondary | ICD-10-CM | POA: Diagnosis not present

## 2020-07-14 DIAGNOSIS — M6281 Muscle weakness (generalized): Secondary | ICD-10-CM

## 2020-07-14 DIAGNOSIS — R2689 Other abnormalities of gait and mobility: Secondary | ICD-10-CM | POA: Diagnosis not present
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
## 2020-07-19 ENCOUNTER — Other Ambulatory Visit: Payer: Self-pay

## 2020-07-19 ENCOUNTER — Ambulatory Visit (INDEPENDENT_AMBULATORY_CARE_PROVIDER_SITE_OTHER): Payer: Medicare Other | Admitting: Rehabilitative and Restorative Service Providers"

## 2020-07-19 DIAGNOSIS — M545 Low back pain, unspecified: Secondary | ICD-10-CM

## 2020-07-19 DIAGNOSIS — R2689 Other abnormalities of gait and mobility: Secondary | ICD-10-CM | POA: Diagnosis not present

## 2020-07-19 DIAGNOSIS — R2681 Unsteadiness on feet: Secondary | ICD-10-CM

## 2020-07-19 DIAGNOSIS — M6281 Muscle weakness (generalized): Secondary | ICD-10-CM
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
## 2020-07-21 ENCOUNTER — Other Ambulatory Visit: Payer: Self-pay

## 2020-07-21 ENCOUNTER — Ambulatory Visit (INDEPENDENT_AMBULATORY_CARE_PROVIDER_SITE_OTHER): Payer: Medicare Other | Admitting: Physical Therapy

## 2020-07-21 DIAGNOSIS — R2681 Unsteadiness on feet: Secondary | ICD-10-CM

## 2020-07-21 DIAGNOSIS — M6281 Muscle weakness (generalized): Secondary | ICD-10-CM | POA: Diagnosis not present

## 2020-07-21 DIAGNOSIS — R2689 Other abnormalities of gait and mobility: Secondary | ICD-10-CM
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
## 2020-07-27 ENCOUNTER — Ambulatory Visit (INDEPENDENT_AMBULATORY_CARE_PROVIDER_SITE_OTHER): Payer: Medicare Other | Admitting: Physical Therapy

## 2020-07-27 ENCOUNTER — Other Ambulatory Visit: Payer: Self-pay

## 2020-07-27 DIAGNOSIS — M6281 Muscle weakness (generalized): Secondary | ICD-10-CM

## 2020-07-27 DIAGNOSIS — R2681 Unsteadiness on feet: Secondary | ICD-10-CM | POA: Diagnosis not present

## 2020-07-27 DIAGNOSIS — R2689 Other abnormalities of gait and mobility: Secondary | ICD-10-CM
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
## 2020-07-29 ENCOUNTER — Other Ambulatory Visit: Payer: Self-pay

## 2020-07-29 ENCOUNTER — Ambulatory Visit (INDEPENDENT_AMBULATORY_CARE_PROVIDER_SITE_OTHER): Payer: Medicare Other | Admitting: Physical Therapy

## 2020-07-29 DIAGNOSIS — R2681 Unsteadiness on feet: Secondary | ICD-10-CM

## 2020-07-29 DIAGNOSIS — R2689 Other abnormalities of gait and mobility: Secondary | ICD-10-CM

## 2020-07-29 DIAGNOSIS — M6281 Muscle weakness (generalized): Secondary | ICD-10-CM
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
## 2020-08-02 ENCOUNTER — Encounter: Payer: Self-pay | Admitting: Rehabilitative and Restorative Service Providers"

## 2020-08-02 ENCOUNTER — Other Ambulatory Visit: Payer: Self-pay

## 2020-08-02 ENCOUNTER — Ambulatory Visit (INDEPENDENT_AMBULATORY_CARE_PROVIDER_SITE_OTHER): Payer: Medicare Other | Admitting: Rehabilitative and Restorative Service Providers"

## 2020-08-02 DIAGNOSIS — M6281 Muscle weakness (generalized): Secondary | ICD-10-CM

## 2020-08-02 DIAGNOSIS — R2681 Unsteadiness on feet: Secondary | ICD-10-CM | POA: Diagnosis not present

## 2020-08-02 DIAGNOSIS — R2689 Other abnormalities of gait and mobility: Secondary | ICD-10-CM
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
## 2020-08-02 NOTE — Patient Instructions (Signed)
ACTIVITIES AFTER DISCHARGE FROM PHYSICAL THERAPY:  1) home exercise handout 5 days per week (they take 10 minutes) 1x per day.  2) Daily activity:  Get up to walk one time each hour in the hours from breakfast to dinner.  Use your walker for safety or cane and countertop.  3) Gym or community exercise 2 days per week.   *Nu-step seated stepper x 15 minutes  *Can try a bike with the low seat

## 2020-08-04 ENCOUNTER — Ambulatory Visit (INDEPENDENT_AMBULATORY_CARE_PROVIDER_SITE_OTHER): Payer: Medicare Other | Admitting: Rehabilitative and Restorative Service Providers"

## 2020-08-04 ENCOUNTER — Other Ambulatory Visit: Payer: Self-pay

## 2020-08-04 DIAGNOSIS — R2689 Other abnormalities of gait and mobility: Secondary | ICD-10-CM

## 2020-08-04 DIAGNOSIS — M545 Low back pain, unspecified: Secondary | ICD-10-CM

## 2020-08-04 DIAGNOSIS — R2681 Unsteadiness on feet: Secondary | ICD-10-CM | POA: Diagnosis not present

## 2020-08-04 DIAGNOSIS — M6281 Muscle weakness (generalized): Secondary | ICD-10-CM
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
## 2020-08-10 ENCOUNTER — Encounter: Payer: Medicare Other | Admitting: Physical Therapy

## 2020-08-12 ENCOUNTER — Encounter: Payer: Medicare Other | Admitting: Rehabilitative and Restorative Service Providers"

## 2020-08-24 ENCOUNTER — Other Ambulatory Visit: Payer: Self-pay

## 2020-11-25 ENCOUNTER — Other Ambulatory Visit: Payer: Self-pay | Admitting: Neurological Surgery

## 2020-11-25 DIAGNOSIS — M5416 Radiculopathy, lumbar region: Secondary | ICD-10-CM

## 2020-12-21 ENCOUNTER — Other Ambulatory Visit: Payer: Medicare Other

## 2020-12-21 ENCOUNTER — Ambulatory Visit
Admission: RE | Admit: 2020-12-21 | Discharge: 2020-12-21 | Disposition: A | Discharge status: Home / Self Care | Payer: Medicare Other | Source: Ambulatory Visit | Attending: Neurological Surgery | Admitting: Neurological Surgery

## 2020-12-21 DIAGNOSIS — M5416 Radiculopathy, lumbar region: Secondary | ICD-10-CM

## 2020-12-21 MED ORDER — GADOBENATE DIMEGLUMINE 529 MG/ML IV SOLN
20.0000 mL | Freq: Once | INTRAVENOUS | Status: AC | PRN
  Administered 2020-12-21: 20 mL via INTRAVENOUS

## 2020-12-31 ENCOUNTER — Other Ambulatory Visit: Payer: Self-pay | Admitting: Neurological Surgery

## 2021-01-24 ENCOUNTER — Other Ambulatory Visit (HOSPITAL_COMMUNITY): Payer: Self-pay | Admitting: *Deleted

## 2021-01-24 NOTE — Progress Notes (Signed)
Surgical Instructions    Your procedure is scheduled on February 10  Report to [facility] Main Entrance "A" at 0950 A.M., then check in with the Admitting office.  Call this number if you have problems the morning of surgery:  [phone]   If you have any questions prior to your surgery date call [phone]: Open Monday-Friday 8am-4pm    Remember:  Do not eat or drink after midnight the night before your surgery    Take these medicines the morning of surgery with A SIP OF WATER  gabapentin (NEURONTIN) methocarbamol (ROBAXIN)  If needed for muscle spasms simvastatin (ZOCOR) tolterodine (DETROL LA) traMADol (ULTRAM)  Follow your surgeon's instructions on when to stop Aspirin.  If no instructions were given by your surgeon then you will need to call the office to get those instructions.    As of today, STOP taking any Aspirin (unless otherwise instructed by your surgeon) Aleve, Naproxen, Ibuprofen, Motrin, Advil, Goody's, BC's, all herbal medications, fish oil, and all vitamins. meloxicam (MOBIC)                     Do not wear jewelry            Do not wear lotions, powders, colognes, or deodorant.            Men may shave face and neck.            Do not bring valuables to the hospital.            [facility] is not responsible for any belongings or valuables.  Do NOT Smoke (Tobacco/Vaping) or drink Alcohol 24 hours prior to your procedure If you use a CPAP at night, you may bring all equipment for your overnight stay.   Contacts, glasses, dentures or bridgework may not be worn into surgery, please bring cases for these belongings   For patients admitted to the hospital, discharge time will be determined by your treatment team.   Patients discharged the day of surgery will not be allowed to drive home, and someone needs to stay with them for 24 hours.    Special instructions:   - Preparing For Surgery  Before surgery, you can play an important role. Because  skin is not sterile, your skin needs to be as free of germs as possible. You can reduce the number of germs on your skin by washing with CHG (chlorahexidine gluconate) Soap before surgery.  CHG is an antiseptic cleaner which kills germs and bonds with the skin to continue killing germs even after washing.    Oral Hygiene is also important to reduce your risk of infection.  Remember - BRUSH YOUR TEETH THE MORNING OF SURGERY WITH YOUR REGULAR TOOTHPASTE  Please do not use if you have an allergy to CHG or antibacterial soaps. If your skin becomes reddened/irritated stop using the CHG.  Do not shave (including legs and underarms) for at least 48 hours prior to first CHG shower. It is OK to shave your face.  Please follow these instructions carefully.   1. Shower the NIGHT BEFORE SURGERY and the MORNING OF SURGERY  2. If you chose to wash your hair, wash your hair first as usual with your normal shampoo.  3. After you shampoo, rinse your hair and body thoroughly to remove the shampoo.  4. Wash Face and genitals (private parts) with your normal soap.   5.  Shower the NIGHT BEFORE SURGERY and the MORNING OF SURGERY with  CHG Soap.   6. Use CHG Soap as you would any other liquid soap. You can apply CHG directly to the skin and wash gently with a scrungie or a clean washcloth.   7. Apply the CHG Soap to your body ONLY FROM THE NECK DOWN.  Do not use on open wounds or open sores. Avoid contact with your eyes, ears, mouth and genitals (private parts). Wash Face and genitals (private parts)  with your normal soap.   8. Wash thoroughly, paying special attention to the area where your surgery will be performed.  9. Thoroughly rinse your body with warm water from the neck down.  10. DO NOT shower/wash with your normal soap after using and rinsing off the CHG Soap.  11. Pat yourself dry with a CLEAN TOWEL.  12. Wear CLEAN PAJAMAS to bed the night before surgery  13. Place CLEAN SHEETS on your bed the  night before your surgery  14. DO NOT SLEEP WITH PETS.   Day of Surgery: Wear Clean/Comfortable clothing the morning of surgery Do not apply any deodorants/lotions.   Remember to brush your teeth WITH YOUR REGULAR TOOTHPASTE.   Please read over the following fact sheets that you were given.

## 2021-01-25 ENCOUNTER — Other Ambulatory Visit (HOSPITAL_COMMUNITY)
Admission: RE | Admit: 2021-01-25 | Discharge: 2021-01-25 | Disposition: A | Discharge status: Home / Self Care | Payer: Medicare Other | Source: Ambulatory Visit | Attending: Neurological Surgery | Admitting: Neurological Surgery

## 2021-01-25 ENCOUNTER — Encounter (HOSPITAL_COMMUNITY)
Admission: RE | Admit: 2021-01-25 | Discharge: 2021-01-25 | Disposition: A | Discharge status: Home / Self Care | Payer: Medicare Other | Source: Ambulatory Visit | Attending: Neurological Surgery | Admitting: Neurological Surgery

## 2021-01-25 ENCOUNTER — Encounter (HOSPITAL_COMMUNITY): Payer: Self-pay

## 2021-01-25 ENCOUNTER — Other Ambulatory Visit: Payer: Self-pay

## 2021-01-25 DIAGNOSIS — I440 Atrioventricular block, first degree: Secondary | ICD-10-CM | POA: Diagnosis not present

## 2021-01-25 DIAGNOSIS — Z20822 Contact with and (suspected) exposure to covid-19: Secondary | ICD-10-CM | POA: Insufficient documentation

## 2021-01-25 DIAGNOSIS — Z01812 Encounter for preprocedural laboratory examination: Secondary | ICD-10-CM | POA: Insufficient documentation

## 2021-01-25 DIAGNOSIS — Z01818 Encounter for other preprocedural examination: Secondary | ICD-10-CM | POA: Diagnosis not present

## 2021-01-25 LAB — BASIC METABOLIC PANEL
Anion gap: 12 (ref 5–15)
BUN: 31 mg/dL — ABNORMAL HIGH (ref 8–23)
CO2: 25 mmol/L (ref 22–32)
Calcium: 9.2 mg/dL (ref 8.9–10.3)
Chloride: 102 mmol/L (ref 98–111)
Creatinine, Ser: 1.53 mg/dL — ABNORMAL HIGH (ref 0.61–1.24)
GFR, Estimated: 45 mL/min — ABNORMAL LOW (ref >60)
Glucose, Bld: 106 mg/dL — ABNORMAL HIGH (ref 70–99)
Potassium: 4.4 mmol/L (ref 3.5–5.1)
Sodium: 139 mmol/L (ref 135–145)

## 2021-01-25 LAB — CBC
HCT: 45 % (ref 39.0–52.0)
Hemoglobin: 14.3 g/dL (ref 13.0–17.0)
MCH: 30.8 pg (ref 26.0–34.0)
MCHC: 31.8 g/dL (ref 30.0–36.0)
MCV: 97 fL (ref 80.0–100.0)
Platelets: 204 10*3/uL (ref 150–400)
RBC: 4.64 MIL/uL (ref 4.22–5.81)
RDW: 12.9 % (ref 11.5–15.5)
WBC: 8.1 10*3/uL (ref 4.0–10.5)
nRBC: 0 % (ref 0.0–0.2)

## 2021-01-25 LAB — SURGICAL PCR SCREEN
MRSA, PCR: NEGATIVE
Staphylococcus aureus: NEGATIVE

## 2021-01-25 LAB — TYPE AND SCREEN
ABO/RH(D): A NEG
Antibody Screen: NEGATIVE

## 2021-01-25 LAB — SARS CORONAVIRUS 2 (TAT 6-24 HRS): SARS Coronavirus 2: NEGATIVE

## 2021-01-25 NOTE — Progress Notes (Addendum)
PCP - Dr. Shon Baton Cardiologist - Denies  Chest x-ray - Not indicated EKG - 01/25/21 Stress Test - Been a long time no f/u needed ECHO - 2017 Cardiac Cath - Denies  Sleep Study - No OSA  DM - Denies  Aspirin Instructions: Last dose Sunday  COVID TEST- 01/25/21   Anesthesia review: yes, abn EKG 1 degree AV Block   Patient denies shortness of breath, fever, cough and chest pain at PAT appointment   All instructions explained to the patient, with a verbal understanding of the material. Patient agrees to go over the instructions while at home for a better understanding. Patient also instructed to self quarantine after being tested for COVID-19. The opportunity to ask questions was provided.

## 2021-01-27 ENCOUNTER — Ambulatory Visit (HOSPITAL_COMMUNITY): Payer: Medicare Other | Admitting: Physician Assistant

## 2021-01-27 ENCOUNTER — Ambulatory Visit (HOSPITAL_COMMUNITY): Payer: Medicare Other

## 2021-01-27 ENCOUNTER — Encounter (HOSPITAL_COMMUNITY)
Admission: RE | Disposition: A | Discharge status: Home / Self Care | Payer: Self-pay | Source: Home / Self Care | Attending: Neurological Surgery

## 2021-01-27 ENCOUNTER — Encounter (HOSPITAL_COMMUNITY): Payer: Self-pay | Admitting: Neurological Surgery

## 2021-01-27 ENCOUNTER — Ambulatory Visit (HOSPITAL_COMMUNITY): Payer: Medicare Other | Admitting: Anesthesiology

## 2021-01-27 ENCOUNTER — Observation Stay (HOSPITAL_COMMUNITY)
Admission: RE | Admit: 2021-01-27 | Discharge: 2021-01-28 | Disposition: A | Discharge status: Home / Self Care | Payer: Medicare Other | Attending: Neurological Surgery | Admitting: Neurological Surgery

## 2021-01-27 ENCOUNTER — Other Ambulatory Visit: Payer: Self-pay

## 2021-01-27 DIAGNOSIS — Z7982 Long term (current) use of aspirin: Secondary | ICD-10-CM | POA: Insufficient documentation

## 2021-01-27 DIAGNOSIS — Z981 Arthrodesis status: Secondary | ICD-10-CM | POA: Insufficient documentation

## 2021-01-27 DIAGNOSIS — Z79899 Other long term (current) drug therapy: Secondary | ICD-10-CM | POA: Diagnosis not present

## 2021-01-27 DIAGNOSIS — Z8551 Personal history of malignant neoplasm of bladder: Secondary | ICD-10-CM | POA: Insufficient documentation

## 2021-01-27 DIAGNOSIS — Z87891 Personal history of nicotine dependence: Secondary | ICD-10-CM | POA: Diagnosis not present

## 2021-01-27 DIAGNOSIS — M4727 Other spondylosis with radiculopathy, lumbosacral region: Principal | ICD-10-CM | POA: Insufficient documentation

## 2021-01-27 DIAGNOSIS — Z419 Encounter for procedure for purposes other than remedying health state, unspecified: Secondary | ICD-10-CM

## 2021-01-27 DIAGNOSIS — M5416 Radiculopathy, lumbar region: Secondary | ICD-10-CM | POA: Diagnosis present

## 2021-01-27 HISTORY — PX: LUMBAR LAMINECTOMY/DECOMPRESSION MICRODISCECTOMY: SHX5026

## 2021-01-27 SURGERY — LUMBAR LAMINECTOMY/DECOMPRESSION MICRODISCECTOMY 1 LEVEL
Anesthesia: General | Site: Spine Lumbar | Laterality: Left

## 2021-01-27 MED ORDER — MENTHOL 3 MG MT LOZG: 1.0000 | LOZENGE | OROMUCOSAL | Status: DC | PRN

## 2021-01-27 MED ORDER — SODIUM CHLORIDE 0.9 % IV SOLN: 250.0000 mL | INTRAVENOUS | Status: DC

## 2021-01-27 MED ORDER — ONDANSETRON HCL 4 MG/2ML IJ SOLN
INTRAMUSCULAR | Status: AC
  Filled 2021-01-27: qty 2

## 2021-01-27 MED ORDER — POLYETHYLENE GLYCOL 3350 17 G PO PACK: 17.0000 g | PACK | Freq: Every day | ORAL | Status: DC | PRN

## 2021-01-27 MED ORDER — OXYCODONE-ACETAMINOPHEN 5-325 MG PO TABS: 1.0000 | ORAL_TABLET | Freq: Four times a day (QID) | ORAL | Status: DC | PRN

## 2021-01-27 MED ORDER — AMISULPRIDE (ANTIEMETIC) 5 MG/2ML IV SOLN: 10.0000 mg | Freq: Once | INTRAVENOUS | Status: DC | PRN

## 2021-01-27 MED ORDER — FLEET ENEMA 7-19 GM/118ML RE ENEM: 1.0000 | ENEMA | Freq: Once | RECTAL | Status: DC | PRN

## 2021-01-27 MED ORDER — ONDANSETRON HCL 4 MG/2ML IJ SOLN: 4.0000 mg | Freq: Once | INTRAMUSCULAR | Status: DC | PRN

## 2021-01-27 MED ORDER — METHOCARBAMOL 1000 MG/10ML IJ SOLN
500.0000 mg | Freq: Four times a day (QID) | INTRAVENOUS | Status: DC | PRN
  Filled 2021-01-27: qty 5

## 2021-01-27 MED ORDER — SENNA 8.6 MG PO TABS
1.0000 | ORAL_TABLET | Freq: Two times a day (BID) | ORAL | Status: DC
  Administered 2021-01-27 – 2021-01-28 (×2): 8.6 mg via ORAL
  Filled 2021-01-27 (×2): qty 1

## 2021-01-27 MED ORDER — LIDOCAINE 2% (20 MG/ML) 5 ML SYRINGE
INTRAMUSCULAR | Status: AC
  Filled 2021-01-27: qty 5

## 2021-01-27 MED ORDER — METHOCARBAMOL 500 MG PO TABS
500.0000 mg | ORAL_TABLET | Freq: Four times a day (QID) | ORAL | Status: DC | PRN
  Administered 2021-01-27: 500 mg via ORAL
  Filled 2021-01-27: qty 1

## 2021-01-27 MED ORDER — OXYCODONE HCL 5 MG PO TABS: 5.0000 mg | ORAL_TABLET | Freq: Once | ORAL | Status: DC | PRN

## 2021-01-27 MED ORDER — ACETAMINOPHEN 325 MG PO TABS
650.0000 mg | ORAL_TABLET | ORAL | Status: DC | PRN
  Administered 2021-01-28: 650 mg via ORAL
  Filled 2021-01-27: qty 2

## 2021-01-27 MED ORDER — DEXAMETHASONE SODIUM PHOSPHATE 10 MG/ML IJ SOLN
INTRAMUSCULAR | Status: AC
  Filled 2021-01-27: qty 1

## 2021-01-27 MED ORDER — ONDANSETRON HCL 4 MG/2ML IJ SOLN
INTRAMUSCULAR | Status: DC | PRN
  Administered 2021-01-27: 4 mg via INTRAVENOUS

## 2021-01-27 MED ORDER — METHOCARBAMOL 500 MG PO TABS: 500.0000 mg | ORAL_TABLET | Freq: Three times a day (TID) | ORAL | Status: DC | PRN

## 2021-01-27 MED ORDER — FESOTERODINE FUMARATE ER 8 MG PO TB24
8.0000 mg | ORAL_TABLET | Freq: Every day | ORAL | Status: DC
  Administered 2021-01-27 – 2021-01-28 (×2): 8 mg via ORAL
  Filled 2021-01-27 (×2): qty 1

## 2021-01-27 MED ORDER — SUGAMMADEX SODIUM 200 MG/2ML IV SOLN
INTRAVENOUS | Status: DC | PRN
  Administered 2021-01-27 (×2): 100 mg via INTRAVENOUS

## 2021-01-27 MED ORDER — ORAL CARE MOUTH RINSE: 15.0000 mL | Freq: Once | OROMUCOSAL | Status: AC

## 2021-01-27 MED ORDER — BUPIVACAINE HCL (PF) 0.5 % IJ SOLN
INTRAMUSCULAR | Status: AC
  Filled 2021-01-27: qty 30

## 2021-01-27 MED ORDER — ROCURONIUM BROMIDE 10 MG/ML (PF) SYRINGE
PREFILLED_SYRINGE | INTRAVENOUS | Status: AC
  Filled 2021-01-27: qty 10

## 2021-01-27 MED ORDER — PROPOFOL 10 MG/ML IV BOLUS
INTRAVENOUS | Status: AC
  Filled 2021-01-27: qty 40

## 2021-01-27 MED ORDER — EPHEDRINE SULFATE-NACL 50-0.9 MG/10ML-% IV SOSY
PREFILLED_SYRINGE | INTRAVENOUS | Status: DC | PRN
  Administered 2021-01-27: 5 mg via INTRAVENOUS

## 2021-01-27 MED ORDER — PHENYLEPHRINE 40 MCG/ML (10ML) SYRINGE FOR IV PUSH (FOR BLOOD PRESSURE SUPPORT)
PREFILLED_SYRINGE | INTRAVENOUS | Status: DC | PRN
  Administered 2021-01-27: 120 ug via INTRAVENOUS
  Administered 2021-01-27 (×3): 80 ug via INTRAVENOUS

## 2021-01-27 MED ORDER — CHLORHEXIDINE GLUCONATE CLOTH 2 % EX PADS: 6.0000 | MEDICATED_PAD | Freq: Once | CUTANEOUS | Status: DC

## 2021-01-27 MED ORDER — LIDOCAINE-EPINEPHRINE 1 %-1:100000 IJ SOLN
INTRAMUSCULAR | Status: AC
  Filled 2021-01-27: qty 1

## 2021-01-27 MED ORDER — SODIUM CHLORIDE 0.9% FLUSH: 3.0000 mL | INTRAVENOUS | Status: DC | PRN

## 2021-01-27 MED ORDER — ROCURONIUM BROMIDE 10 MG/ML (PF) SYRINGE
PREFILLED_SYRINGE | INTRAVENOUS | Status: DC | PRN
  Administered 2021-01-27: 90 mg via INTRAVENOUS

## 2021-01-27 MED ORDER — BISACODYL 10 MG RE SUPP: 10.0000 mg | Freq: Every day | RECTAL | Status: DC | PRN

## 2021-01-27 MED ORDER — SODIUM CHLORIDE 0.9% FLUSH
3.0000 mL | Freq: Two times a day (BID) | INTRAVENOUS | Status: DC
  Administered 2021-01-28: 3 mL via INTRAVENOUS

## 2021-01-27 MED ORDER — PROPOFOL 10 MG/ML IV BOLUS
INTRAVENOUS | Status: DC | PRN
  Administered 2021-01-27: 120 mg via INTRAVENOUS

## 2021-01-27 MED ORDER — PHENOL 1.4 % MT LIQD: 1.0000 | OROMUCOSAL | Status: DC | PRN

## 2021-01-27 MED ORDER — LIDOCAINE 2% (20 MG/ML) 5 ML SYRINGE
INTRAMUSCULAR | Status: DC | PRN
  Administered 2021-01-27: 100 mg via INTRAVENOUS

## 2021-01-27 MED ORDER — ALUM & MAG HYDROXIDE-SIMETH 200-200-20 MG/5ML PO SUSP: 30.0000 mL | Freq: Four times a day (QID) | ORAL | Status: DC | PRN

## 2021-01-27 MED ORDER — CEFAZOLIN SODIUM-DEXTROSE 2-4 GM/100ML-% IV SOLN
2.0000 g | INTRAVENOUS | Status: AC
  Administered 2021-01-27: 2 g via INTRAVENOUS
  Filled 2021-01-27: qty 100

## 2021-01-27 MED ORDER — LACTATED RINGERS IV SOLN: INTRAVENOUS | Status: DC

## 2021-01-27 MED ORDER — 0.9 % SODIUM CHLORIDE (POUR BTL) OPTIME
TOPICAL | Status: DC | PRN
  Administered 2021-01-27: 1000 mL

## 2021-01-27 MED ORDER — CHLORHEXIDINE GLUCONATE 0.12 % MT SOLN
15.0000 mL | Freq: Once | OROMUCOSAL | Status: AC
  Administered 2021-01-27: 15 mL via OROMUCOSAL
  Filled 2021-01-27: qty 15

## 2021-01-27 MED ORDER — DEXAMETHASONE SODIUM PHOSPHATE 10 MG/ML IJ SOLN
INTRAMUSCULAR | Status: DC | PRN
  Administered 2021-01-27: 5 mg via INTRAVENOUS

## 2021-01-27 MED ORDER — PHENYLEPHRINE 40 MCG/ML (10ML) SYRINGE FOR IV PUSH (FOR BLOOD PRESSURE SUPPORT)
PREFILLED_SYRINGE | INTRAVENOUS | Status: AC
  Filled 2021-01-27: qty 10

## 2021-01-27 MED ORDER — MORPHINE SULFATE (PF) 2 MG/ML IV SOLN: 2.0000 mg | INTRAVENOUS | Status: DC | PRN

## 2021-01-27 MED ORDER — THROMBIN 5000 UNITS EX SOLR
CUTANEOUS | Status: AC
  Filled 2021-01-27: qty 5000

## 2021-01-27 MED ORDER — GABAPENTIN 300 MG PO CAPS
300.0000 mg | ORAL_CAPSULE | Freq: Every day | ORAL | Status: DC
Start: 2021-01-28 — End: 2021-01-28
  Administered 2021-01-28: 300 mg via ORAL
  Filled 2021-01-27: qty 1

## 2021-01-27 MED ORDER — FENTANYL CITRATE (PF) 250 MCG/5ML IJ SOLN
INTRAMUSCULAR | Status: AC
  Filled 2021-01-27: qty 5

## 2021-01-27 MED ORDER — CEFAZOLIN SODIUM-DEXTROSE 2-4 GM/100ML-% IV SOLN
2.0000 g | Freq: Three times a day (TID) | INTRAVENOUS | Status: AC
  Administered 2021-01-27 – 2021-01-28 (×2): 2 g via INTRAVENOUS
  Filled 2021-01-27 (×2): qty 100

## 2021-01-27 MED ORDER — PROPOFOL 10 MG/ML IV BOLUS
INTRAVENOUS | Status: AC
  Filled 2021-01-27: qty 20

## 2021-01-27 MED ORDER — BUPIVACAINE HCL (PF) 0.5 % IJ SOLN
INTRAMUSCULAR | Status: DC | PRN
  Administered 2021-01-27: 2.5 mL
  Administered 2021-01-27: 20 mL

## 2021-01-27 MED ORDER — FENTANYL CITRATE (PF) 100 MCG/2ML IJ SOLN: 25.0000 ug | INTRAMUSCULAR | Status: DC | PRN

## 2021-01-27 MED ORDER — FENTANYL CITRATE (PF) 250 MCG/5ML IJ SOLN
INTRAMUSCULAR | Status: DC | PRN
  Administered 2021-01-27: 50 ug via INTRAVENOUS

## 2021-01-27 MED ORDER — DOCUSATE SODIUM 100 MG PO CAPS: 100.0000 mg | ORAL_CAPSULE | Freq: Every day | ORAL | Status: DC

## 2021-01-27 MED ORDER — OXYCODONE HCL 5 MG/5ML PO SOLN: 5.0000 mg | Freq: Once | ORAL | Status: DC | PRN

## 2021-01-27 MED ORDER — ACETAMINOPHEN 650 MG RE SUPP: 650.0000 mg | RECTAL | Status: DC | PRN

## 2021-01-27 MED ORDER — THROMBIN 5000 UNITS EX SOLR
OROMUCOSAL | Status: DC | PRN
  Administered 2021-01-27: 5 mL via TOPICAL

## 2021-01-27 MED ORDER — RISAQUAD PO CAPS
1.0000 | ORAL_CAPSULE | Freq: Every day | ORAL | Status: DC
  Administered 2021-01-27 – 2021-01-28 (×2): 1 via ORAL
  Filled 2021-01-27 (×2): qty 1

## 2021-01-27 MED ORDER — LIDOCAINE-EPINEPHRINE 1 %-1:100000 IJ SOLN
INTRAMUSCULAR | Status: DC | PRN
  Administered 2021-01-27: 2.5 mL

## 2021-01-27 MED ORDER — EPHEDRINE 5 MG/ML INJ
INTRAVENOUS | Status: AC
  Filled 2021-01-27: qty 10

## 2021-01-27 MED ORDER — DOCUSATE SODIUM 100 MG PO CAPS
100.0000 mg | ORAL_CAPSULE | Freq: Two times a day (BID) | ORAL | Status: DC
  Administered 2021-01-27 – 2021-01-28 (×2): 100 mg via ORAL
  Filled 2021-01-27 (×2): qty 1

## 2021-01-27 SURGICAL SUPPLY — 51 items
ADH SKN CLS APL DERMABOND .7 (GAUZE/BANDAGES/DRESSINGS) ×1
BAND INSRT 18 STRL LF DISP RB (MISCELLANEOUS)
BAND RUBBER #18 3X1/16 STRL (MISCELLANEOUS) IMPLANT
BLADE CLIPPER SURG (BLADE) IMPLANT
BUR ACORN 6.0 (BURR) IMPLANT
BUR MATCHSTICK NEURO 3.0 LAGG (BURR) ×2 IMPLANT
CANISTER SUCT 3000ML PPV (MISCELLANEOUS) ×2 IMPLANT
COVER WAND RF STERILE (DRAPES) ×1 IMPLANT
DECANTER SPIKE VIAL GLASS SM (MISCELLANEOUS) ×2 IMPLANT
DERMABOND ADVANCED (GAUZE/BANDAGES/DRESSINGS) ×1
DERMABOND ADVANCED .7 DNX12 (GAUZE/BANDAGES/DRESSINGS) ×1 IMPLANT
DEVICE DISSECT PLASMABLAD 3.0S (MISCELLANEOUS) ×1 IMPLANT
DRAPE HALF SHEET 40X57 (DRAPES) IMPLANT
DRAPE LAPAROTOMY T 102X78X121 (DRAPES) ×2 IMPLANT
DRAPE MICROSCOPE LEICA (MISCELLANEOUS) IMPLANT
DRSG OPSITE POSTOP 3X4 (GAUZE/BANDAGES/DRESSINGS) ×1 IMPLANT
DURAPREP 26ML APPLICATOR (WOUND CARE) ×2 IMPLANT
ELECT REM PT RETURN 9FT ADLT (ELECTROSURGICAL) ×2
ELECTRODE REM PT RTRN 9FT ADLT (ELECTROSURGICAL) ×1 IMPLANT
GAUZE 4X4 16PLY RFD (DISPOSABLE) IMPLANT
GAUZE SPONGE 4X4 12PLY STRL (GAUZE/BANDAGES/DRESSINGS) ×1 IMPLANT
GLOVE BIOGEL PI IND STRL 8.5 (GLOVE) ×1 IMPLANT
GLOVE BIOGEL PI INDICATOR 8.5 (GLOVE) ×2
GLOVE ECLIPSE 8.5 STRL (GLOVE) ×2 IMPLANT
GLOVE SURG UNDER POLY LF SZ6.5 (GLOVE) ×1 IMPLANT
GLOVE SURG UNDER POLY LF SZ7.5 (GLOVE) ×1 IMPLANT
GOWN STRL REUS W/ TWL LRG LVL3 (GOWN DISPOSABLE) IMPLANT
GOWN STRL REUS W/ TWL XL LVL3 (GOWN DISPOSABLE) IMPLANT
GOWN STRL REUS W/TWL 2XL LVL3 (GOWN DISPOSABLE) ×2 IMPLANT
GOWN STRL REUS W/TWL LRG LVL3 (GOWN DISPOSABLE) ×6
GOWN STRL REUS W/TWL XL LVL3 (GOWN DISPOSABLE) ×4
HEMOSTAT POWDER SURGIFOAM 1G (HEMOSTASIS) ×1 IMPLANT
KIT BASIN OR (CUSTOM PROCEDURE TRAY) ×2 IMPLANT
KIT TURNOVER KIT B (KITS) ×2 IMPLANT
NDL SPNL 20GX3.5 QUINCKE YW (NEEDLE) IMPLANT
NEEDLE HYPO 22GX1.5 SAFETY (NEEDLE) ×2 IMPLANT
NEEDLE SPNL 20GX3.5 QUINCKE YW (NEEDLE) ×2 IMPLANT
NS IRRIG 1000ML POUR BTL (IV SOLUTION) ×2 IMPLANT
PACK LAMINECTOMY NEURO (CUSTOM PROCEDURE TRAY) ×2 IMPLANT
PAD ARMBOARD 7.5X6 YLW CONV (MISCELLANEOUS) ×8 IMPLANT
PATTIES SURGICAL .5 X1 (DISPOSABLE) ×2 IMPLANT
PLASMABLADE 3.0S (MISCELLANEOUS) ×2
SPONGE SURGIFOAM ABS GEL SZ50 (HEMOSTASIS) ×2 IMPLANT
SUT VIC AB 1 CT1 18XBRD ANBCTR (SUTURE) ×1 IMPLANT
SUT VIC AB 1 CT1 8-18 (SUTURE) ×2
SUT VIC AB 2-0 CP2 18 (SUTURE) ×2 IMPLANT
SUT VIC AB 3-0 SH 8-18 (SUTURE) ×2 IMPLANT
SUT VIC AB 4-0 RB1 18 (SUTURE) ×2 IMPLANT
TOWEL GREEN STERILE (TOWEL DISPOSABLE) ×2 IMPLANT
TOWEL GREEN STERILE FF (TOWEL DISPOSABLE) ×2 IMPLANT
WATER STERILE IRR 1000ML POUR (IV SOLUTION) ×2 IMPLANT

## 2021-01-27 NOTE — Transfer of Care (Signed)
Immediate Anesthesia Transfer of Care Note  Patient: [name]  Procedure(s) Performed: Left Lumbar Five Sacral One Laminectomy for facet/synovial cyst (Left Spine Lumbar)  Patient Location: PACU  Anesthesia Type:General  Level of Consciousness: drowsy  Airway & Oxygen Therapy: Patient Spontanous Breathing and Patient connected to face mask oxygen  Post-op Assessment: Report given to RN and Post -op Vital signs reviewed and stable  Post vital signs: Reviewed and stable  Last Vitals:  Vitals Value Taken Time  BP 121/75 01/27/21 1403  Temp    Pulse 88 01/27/21 1408  Resp 18 01/27/21 1408  SpO2 99 % 01/27/21 1408  Vitals shown include unvalidated device data.  Last Pain:  Vitals:   01/27/21 1021  TempSrc:   PainSc: 0-No pain         Complications: No complications documented.

## 2021-01-27 NOTE — Progress Notes (Signed)
Pt wallet (black) given to wife [name]) in the waiting room
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
## 2021-01-27 NOTE — Progress Notes (Signed)
Ring removed and placed in a labeled pink cup and then in a labeled plastic bag and placed in patient belonging bag.

## 2021-01-27 NOTE — Op Note (Signed)
Date of surgery: 01/27/2021 Preoperative diagnosis: Spondylosis with spondylolisthesis L5-S1 left lumbar radiculopathy Postoperative diagnosis: Same Procedure: Laminectomy and foraminotomies L5-S1 left with decompression of the L5 and S1 nerve roots Surgeon: Kristeen Miss First Assistant: Duffy Rhody, MD Anesthesia: General endotracheal Indications: [name] is a 85 year old individual whose had significant back surgery in the past having undergone decompression and fusion from L2-L5.  He has had a progressively worsening left L5 radiculopathy that has become refractory to any kind of treatment.  I have advised surgical decompression of what appears to be in hypertrophied synovial cyst and severe spondylosis on top of the spondylolisthesis at the L5-S1 level.  Procedure: Patient was brought to the operating room supine on the stretcher.  After the smooth induction of general endotracheal anesthesia is carefully turned prone onto the operating table.  The bony prominences were appropriately padded and protected and his arms were tucked at the sides because of previous shoulder pathology.  The back was scrubbed with alcohol DuraPrep and draped in a sterile fashion.  A localizing radiograph identified the L5-S1 space with a needle in the space and then a vertical incision was created over the dorsum of the skin at the L5-S1 level after infiltrating with 10 cc of local anesthetic.  Dissection was taken down to the left side of the midline in the subperiosteal dissection was performed at L5-S1.  A self-retaining retractor was placed in the wound.  Laminotomy was then created removing the inferior margin lamina of L5 out to and including a large portion of the mesial facet.  A second localizing radiograph identified the extent of the dissection cephalad above the L5-S1 disc space.  Thickened redundant yellow ligament was taken up in this area and eventually the dural tube was identified.  On the lateral  aspect of the dural tube we were able to then traced the anterior foraminal space at the level of the disc.  This was carefully dissected cephalad and ultimately the path the L5 nerve root was identified there was substantial thickened redundant grumous material in this region with ossification within the ligament in this area that was creating compression on the L5 nerve root and exit foramen this was taken down with a combination of high-speed drill straight and angled Kerrison punches to decompress the path of the L5 nerve root out the foramen care was taken in this process to not damage or compress the L5 nerve root once this was accomplished the L5 nerve root could easily be sounded out the foramen and there was plenty of room around it ventrally dorsally anteriorly and posteriorly.  The path the S1 nerve root was then decompressed in a similar fashion in the end hemostasis in the soft tissues was obtained meticulously and after conferring with Dr. Marcello Moores who inspected the area and also performed portion of decompression we closed using #1 Vicryl in the fascia 2-0 Vicryl in the subcutaneous tissues 3-0 Vicryl's in the superficial subcuticular layer and 4-0 Vicryl also in the subcuticular layer blood loss is estimated 50 cc 20 cc of half percent Marcaine was infiltrated into the surgical site and the patient was then returned to recovery room after placing Dermabond dressing along with a honeycomb dressing over that.

## 2021-01-27 NOTE — Anesthesia Preprocedure Evaluation (Signed)
Anesthesia Evaluation  Patient identified by MRN, date of birth, ID band Patient awake    Reviewed: Allergy & Precautions, NPO status , Patient's Chart, lab work & pertinent test results  Airway Mallampati: I  TM Distance: >3 FB Neck ROM: Full    Dental   Several missing teeth, none loose:   Pulmonary neg pulmonary ROS, former smoker,    Pulmonary exam normal breath sounds clear to auscultation       Cardiovascular Exercise Tolerance: Good negative cardio ROS Normal cardiovascular exam Rhythm:Regular Rate:Normal     Neuro/Psych negative neurological ROS  negative psych ROS   GI/Hepatic negative GI ROS, Neg liver ROS,   Endo/Other  hypercholesterolemia  Renal/GU Renal InsufficiencyRenal disease  negative genitourinary   Musculoskeletal  (+) Arthritis ,   Abdominal Normal abdominal exam  (+)   Peds negative pediatric ROS (+)  Hematology negative hematology ROS (+)   Anesthesia Other Findings   Reproductive/Obstetrics negative OB ROS                             Anesthesia Physical Anesthesia Plan  ASA: II  Anesthesia Plan: General   Post-op Pain Management:    Induction: Intravenous  PONV Risk Score and Plan: Ondansetron, Dexamethasone and Treatment may vary due to age or medical condition  Airway Management Planned: Oral ETT  Additional Equipment: None  Intra-op Plan:   Post-operative Plan: Extubation in OR  Informed Consent: I have reviewed the patients History and Physical, chart, labs and discussed the procedure including the risks, benefits and alternatives for the proposed anesthesia with the patient or authorized representative who has indicated his/her understanding and acceptance.       Plan Discussed with: CRNA and Anesthesiologist  Anesthesia Plan Comments:         Anesthesia Quick Evaluation

## 2021-01-27 NOTE — Anesthesia Procedure Notes (Signed)
Procedure Name: Intubation Date/Time: 01/27/2021 12:12 PM Performed by: Trinna Post., CRNA Pre-anesthesia Checklist: Patient identified, Emergency Drugs available, Suction available, Patient being monitored and Timeout performed Patient Re-evaluated:Patient Re-evaluated prior to induction Oxygen Delivery Method: Circle system utilized Preoxygenation: Pre-oxygenation with 100% oxygen Induction Type: IV induction Ventilation: Mask ventilation without difficulty Laryngoscope Size: Mac and 4 Grade View: Grade I Tube type: Oral Tube size: 7.5 mm Number of attempts: 1 Airway Equipment and Method: Stylet Placement Confirmation: ETT inserted through vocal cords under direct vision,  positive ETCO2 and breath sounds checked- equal and bilateral Secured at: 22 cm Tube secured with: Tape Dental Injury: Teeth and Oropharynx as per pre-operative assessment

## 2021-01-28 ENCOUNTER — Encounter (HOSPITAL_COMMUNITY): Payer: Self-pay | Admitting: Neurological Surgery

## 2021-01-28 DIAGNOSIS — M4727 Other spondylosis with radiculopathy, lumbosacral region: Secondary | ICD-10-CM | POA: Diagnosis not present

## 2021-01-28 LAB — BASIC METABOLIC PANEL
Anion gap: 10 (ref 5–15)
BUN: 30 mg/dL — ABNORMAL HIGH (ref 8–23)
CO2: 24 mmol/L (ref 22–32)
Calcium: 9.2 mg/dL (ref 8.9–10.3)
Chloride: 105 mmol/L (ref 98–111)
Creatinine, Ser: 1.46 mg/dL — ABNORMAL HIGH (ref 0.61–1.24)
GFR, Estimated: 48 mL/min — ABNORMAL LOW (ref >60)
Glucose, Bld: 154 mg/dL — ABNORMAL HIGH (ref 70–99)
Potassium: 5.1 mmol/L (ref 3.5–5.1)
Sodium: 139 mmol/L (ref 135–145)

## 2021-01-28 LAB — CBC
HCT: 39.3 % (ref 39.0–52.0)
Hemoglobin: 12.7 g/dL — ABNORMAL LOW (ref 13.0–17.0)
MCH: 30.9 pg (ref 26.0–34.0)
MCHC: 32.3 g/dL (ref 30.0–36.0)
MCV: 95.6 fL (ref 80.0–100.0)
Platelets: 179 10*3/uL (ref 150–400)
RBC: 4.11 MIL/uL — ABNORMAL LOW (ref 4.22–5.81)
RDW: 12.7 % (ref 11.5–15.5)
WBC: 12.9 10*3/uL — ABNORMAL HIGH (ref 4.0–10.5)
nRBC: 0 % (ref 0.0–0.2)

## 2021-01-28 MED ORDER — OXYCODONE-ACETAMINOPHEN 5-325 MG PO TABS
1.0000 | ORAL_TABLET | Freq: Four times a day (QID) | ORAL | 0 refills | Status: DC | PRN
Start: 2021-01-28 — End: 2022-01-25

## 2021-01-28 MED ORDER — DOXYCYCLINE HYCLATE 50 MG PO CAPS: 100.0000 mg | ORAL_CAPSULE | Freq: Two times a day (BID) | ORAL | 0 refills | Status: DC

## 2021-01-28 MED ORDER — METHOCARBAMOL 500 MG PO TABS: 500.0000 mg | ORAL_TABLET | Freq: Four times a day (QID) | ORAL | 3 refills | Status: DC | PRN

## 2021-01-28 NOTE — Discharge Instructions (Signed)
Wound Care Remove outer dressing in 2-3 days Leave incision open to air. You may shower. Do not scrub directly on incision.  Do not put any creams, lotions, or ointments on incision. Activity Walk each and every day, increasing distance each day. No lifting greater than 5 lbs.  Avoid bending, arching, and twisting. No driving for 2 weeks; may ride as a passenger locally. If provided with back brace, wear when out of bed.  It is not necessary to wear in bed. Diet Resume your normal diet.   Call Your Doctor If Any of These Occur Redness, drainage, or swelling at the wound.  Temperature greater than 101 degrees. Severe pain not relieved by pain medication. Incision starts to come apart. Follow Up Appt Call today for appointment in 2-3 weeks ([phone]) or for problems.  If you have any hardware placed in your spine, you will need an x-ray before your appointment.

## 2021-01-28 NOTE — Evaluation (Signed)
Occupational Therapy Evaluation Patient Details Name: [name] MRN: [id number] DOB: [birth year] Today's Date: 01/28/2021    History of Present Illness 85 year old individual who has had severe lumbar radiculopathy and left lower extremity. Pt with PMH including bladder CA, cataract, HLD, L2-5 PLIF. Pt found to have large synovial cyst at L5-S1 level compressing S1 nerve root. On 01/27/2021 pt underwent L5-S1 laminectomy and foraminotomy.   Clinical Impression   PTA, pt was living at home with his wife, pt reports he was independent with ADL/IADL and functional mobility. Pt reports having difficulty donning/doffing LLE clothing. Noted increased edema in pt's LLE, pt reports Dr. Ellene Route is aware. Pt currently requires minA to powerup into standing from a lower surface and minguard to powerup from an elevated surface. Pt requires minguard for LB dressing with AE, toilet transfers and grooming at sink level. Due to decline in current level of function, pt would benefit from acute OT to address established goals to facilitate safe D/C to venue listed below. At this time, recommend HHOT follow-up. Will continue to follow acutely.     Follow Up Recommendations  Home health OT;Supervision - Intermittent    Equipment Recommendations  3 in 1 bedside commode    Recommendations for Other Services       Precautions / Restrictions Precautions Precautions: Fall;Back Precaution Booklet Issued: Yes (comment) Precaution Comments: pt requires multiple cues to maintain back precautions, having difficulty not bending Required Braces or Orthoses:  (no brace needed) Restrictions Weight Bearing Restrictions: No      Mobility Bed Mobility Overal bed mobility: Needs Assistance Bed Mobility: Rolling;Sidelying to Sit Rolling: Min guard Sidelying to sit: Min assist     Sit to sidelying: Min assist General bed mobility comments: cues for proper technique, cues for progression of BLE and minA to  progress trunk to upright position    Transfers Overall transfer level: Needs assistance Equipment used: Rolling walker (2 wheeled) Transfers: Sit to/from Stand Sit to Stand: Min guard;Min assist Stand pivot transfers: Supervision       General transfer comment: minguard to stand from elevated surface, cues to limit bending, minA to powerup from lower surface height    Balance Overall balance assessment: Needs assistance Sitting-balance support: No upper extremity supported;Feet supported Sitting balance-Leahy Scale: Good     Standing balance support: Bilateral upper extremity supported;Single extremity supported;During functional activity Standing balance-Leahy Scale: Poor Standing balance comment: reliant on UE support of RW or railing                           ADL either performed or assessed with clinical judgement   ADL Overall ADL's : Needs assistance/impaired Eating/Feeding: Set up;Sitting   Grooming: Min guard;Standing Grooming Details (indicate cue type and reason): assist for stability, safety and use of compensatory strategies Upper Body Bathing: Set up;Sitting   Lower Body Bathing: Minimal assistance;Sit to/from stand   Upper Body Dressing : Set up;Sitting   Lower Body Dressing: Minimal assistance;Sit to/from stand Lower Body Dressing Details (indicate cue type and reason): with use of AE to assist with LB dressing Toilet Transfer: Minimal assistance;Ambulation Toilet Transfer Details (indicate cue type and reason): with safe use of RW Toileting- Clothing Manipulation and Hygiene: Minimal assistance;Sit to/from stand       Functional mobility during ADLs: Minimal assistance;Cueing for safety;Cueing for sequencing;Rolling walker General ADL Comments: pt with poor adherence to back precautions, limited standing tolerance and instability     Vision  Perception     Praxis      Pertinent Vitals/Pain Pain Assessment: 0-10 Pain Score:  2  Pain Location: low back Pain Descriptors / Indicators: Aching Pain Intervention(s): Monitored during session;Limited activity within patient's tolerance     Hand Dominance Right   Extremity/Trunk Assessment Upper Extremity Assessment Upper Extremity Assessment: Overall WFL for tasks assessed   Lower Extremity Assessment Lower Extremity Assessment: Generalized weakness;LLE deficits/detail LLE Deficits / Details: noted increased edema and redness, pt reports this has been occurring for "a while". Pt reports Dr. Ellene Route is aware LLE Sensation: WNL   Cervical / Trunk Assessment Cervical / Trunk Assessment: Kyphotic   Communication Communication Communication: No difficulties   Cognition Arousal/Alertness: Awake/alert Behavior During Therapy: WFL for tasks assessed/performed Overall Cognitive Status: No family/caregiver present to determine baseline cognitive functioning Area of Impairment: Memory;Problem solving                     Memory: Decreased recall of precautions;Decreased short-term memory       Problem Solving: Slow processing General Comments: pt requires multiple cues for adherence to back precautions and problem solving through scenarios and ADL to adhere to precautions. Pt with decreased awareness of safety and poor use of RW   General Comments  vss    Exercises     Shoulder Instructions      Home Living Family/patient expects to be discharged to:: Private residence Living Arrangements: Spouse/significant other Available Help at Discharge: Family;Available 24 hours/day Type of Home: House Home Access: Stairs to enter [employer] of Steps: 1 Entrance Stairs-Rails: None Home Layout: One level     Bathroom Shower/Tub: Occupational psychologist: Handicapped height     Home Equipment: Environmental consultant - 2 wheels;Cane - single point;Shower seat;Grab bars - tub/shower;Grab bars - toilet (bed rail)          Prior  Functioning/Environment Level of Independence: Independent with assistive device(s)        Comments: pt ambulates household distances with use of RW. Pt reports utilizing electric scooter when out at stores as he fatigues over longer distances        OT Problem List: Decreased activity tolerance;Impaired balance (sitting and/or standing);Decreased safety awareness;Decreased knowledge of use of DME or AE;Decreased knowledge of precautions;Obesity;Pain;Increased edema      OT Treatment/Interventions: Self-care/ADL training;Therapeutic exercise;Energy conservation;DME and/or AE instruction;Therapeutic activities;Patient/family education;Balance training    OT Goals(Current goals can be found in the care plan section) Acute Rehab OT Goals Patient Stated Goal: to go home OT Goal Formulation: With patient Time For Goal Achievement: 02/11/21 Potential to Achieve Goals: Good ADL Goals Pt Will Perform Grooming: with modified independence;standing Pt Will Perform Lower Body Dressing: with modified independence;sit to/from stand Pt Will Transfer to Toilet: with modified independence;ambulating  OT Frequency: Min 2X/week   Barriers to D/C:            Co-evaluation              AM-PAC OT "6 Clicks" Daily Activity     Outcome Measure Help from another person eating meals?: A Little Help from another person taking care of personal grooming?: A Little Help from another person toileting, which includes using toliet, bedpan, or urinal?: A Little Help from another person bathing (including washing, rinsing, drying)?: A Little Help from another person to put on and taking off regular upper body clothing?: A Little Help from another person to put on and taking off regular lower body clothing?: A  Little 6 Click Score: 18   End of Session Equipment Utilized During Treatment: Surveyor, mining Communication: Mobility status  Activity Tolerance: Patient tolerated treatment well Patient  left: in chair;with call bell/phone within reach  OT Visit Diagnosis: Unsteadiness on feet (R26.81);Other abnormalities of gait and mobility (R26.89);Muscle weakness (generalized) (M62.81);Pain Pain - part of body:  (incision site)                Time: 9476-5465 OT Time Calculation (min): 37 min Charges:  OT General Charges $OT Visit: 1 Visit OT Evaluation $OT Eval Moderate Complexity: 1 Mod OT Treatments $Self Care/Home Management : 8-22 mins  Helene Kelp OTR/L Acute Rehabilitation Services Office: [place] 01/28/2021, 11:17 AM

## 2021-01-28 NOTE — Progress Notes (Signed)
Patient is discharged from room 3C08 at this time. Alert and in stable condition. IV site d/c'd and instructions read to patient and spouse with understanding verbalized and all questions answered. Left unit via wheelchair with all belongings at side.

## 2021-01-28 NOTE — Anesthesia Postprocedure Evaluation (Signed)
Anesthesia Post Note  Patient: [name]  Procedure(s) Performed: Left Lumbar Five Sacral One Laminectomy for facet/synovial cyst (Left Spine Lumbar)     Patient location during evaluation: PACU Anesthesia Type: General Level of consciousness: awake and alert Pain management: pain level controlled Vital Signs Assessment: post-procedure vital signs reviewed and stable Respiratory status: spontaneous breathing, nonlabored ventilation and respiratory function stable Cardiovascular status: blood pressure returned to baseline and stable Postop Assessment: no apparent nausea or vomiting Anesthetic complications: no   No complications documented.  Last Vitals:  Vitals:   01/28/21 0309 01/28/21 0742  BP: 122/72 118/65  Pulse: 97 (!) 110  Resp: 20 17  Temp:  36.7 C  SpO2: 94% 96%    Last Pain:  Vitals:   01/28/21 0742  TempSrc: Oral  PainSc:    Pain Goal: Patients Stated Pain Goal: 2 (01/27/21 1930)                 Merlinda Frederick

## 2021-01-28 NOTE — Care Management (Signed)
Talked to patient on phone. He does not want home health PT , prefers OP PT at [place] [facility]. Order entered and placed on AVS.   Magdalen Spatz RN

## 2021-01-28 NOTE — Evaluation (Signed)
Physical Therapy Evaluation Patient Details Name: [name] MRN: [id number] DOB: 1936-07-25 Today's Date: 01/28/2021   History of Present Illness  85 year old individual who has had severe lumbar radiculopathy and left lower extremity. Pt with PMH including bladder CA, cataract, HLD, L2-5 PLIF. Pt found to have large synovial cyst at L5-S1 level compressing S1 nerve root. On 01/27/2021 pt underwent L5-S1 laminectomy and foraminotomy.  Clinical Impression  Pt presents to PT with deficits in functional mobility, gait, balance, endurance, strength, power, memory. Pt has difficulty maintaining back precautions at session, with chronic kyphotic posture. Pt is able to improve posture with verbal cues from PT but does tend to bend forward to lean on walker with fatigue. Pt demonstrates LE weakness and currently requires assistance to manage LEs during bed mobility. Pt will benefit from acute PT POC to improve gait quality, bed mobility, and adherence to back precautions. Pt's daughter is a PT and he has multiple caregivers prepared to support at home. PT recommends discharge home with HHPT, no current DME needs.    Follow Up Recommendations Home health PT;Supervision for mobility/OOB    Equipment Recommendations  None recommended by PT    Recommendations for Other Services       Precautions / Restrictions Precautions Precautions: Fall;Back Precaution Booklet Issued: Yes (comment) Precaution Comments: pt requires multiple cues to maintain back precautions, having difficulty not bending Required Braces or Orthoses:  (no brace needed) Restrictions Weight Bearing Restrictions: No      Mobility  Bed Mobility Overal bed mobility: Needs Assistance Bed Mobility: Rolling;Sit to Sidelying Rolling: Min assist       Sit to sidelying: Min assist General bed mobility comments: pt requires assistance for LE management with bed mobility    Transfers Overall transfer level: Needs  assistance Equipment used: Rolling walker (2 wheeled) Transfers: Sit to/from [facility] Sit to Stand: Supervision Stand pivot transfers: Supervision       General transfer comment: verbal cues to limit bending  Ambulation/Gait Ambulation/Gait assistance: Supervision Gait Distance (Feet): 100 Feet Assistive device: Rolling walker (2 wheeled) Gait Pattern/deviations: Step-through pattern Gait velocity: reduced Gait velocity interpretation: <1.8 ft/sec, indicate of risk for recurrent falls General Gait Details: pt with shortened step-through gait, increased trunk flexion, PT providing multiple cues to maintain RW closer to BOS to reduce trunk flexion. Pt is able to follow cues but trunk flexion returns with fatigue  Stairs            Wheelchair Mobility    Modified Rankin (Stroke Patients Only)       Balance Overall balance assessment: Needs assistance Sitting-balance support: No upper extremity supported;Feet supported Sitting balance-Leahy Scale: Good     Standing balance support: Bilateral upper extremity supported;Single extremity supported Standing balance-Leahy Scale: Poor Standing balance comment: reliant on UE support of RW or railing                             Pertinent Vitals/Pain Pain Assessment: 0-10 Pain Score: 3  Pain Location: low back Pain Descriptors / Indicators: Aching Pain Intervention(s): Monitored during session    Home Living Family/patient expects to be discharged to:: Private residence Living Arrangements: Spouse/significant other Available Help at Discharge: Family;Available 24 hours/day Type of Home: House Home Access: Stairs to enter Entrance Stairs-Rails: None Entrance Stairs-Number of Steps: 1 Home Layout: One level Home Equipment: Walker - 2 wheels;Cane - single point;Shower seat;Grab bars - tub/shower;Grab bars - toilet (bed rail)  Prior Function Level of Independence: Independent with  assistive device(s)         Comments: pt ambulates household distances with use of RW. Pt reports utilizing electric scooter when out at stores as he fatigues over longer distances     Hand Dominance   Dominant Hand: Right    Extremity/Trunk Assessment   Upper Extremity Assessment Upper Extremity Assessment: Overall WFL for tasks assessed    Lower Extremity Assessment Lower Extremity Assessment: Generalized weakness    Cervical / Trunk Assessment Cervical / Trunk Assessment: Kyphotic  Communication   Communication: No difficulties  Cognition Arousal/Alertness: Awake/alert Behavior During Therapy: WFL for tasks assessed/performed Overall Cognitive Status: Impaired/Different from baseline Area of Impairment: Memory                     Memory: Decreased recall of precautions;Decreased short-term memory         General Comments: pt requires multiple cues to improve posture and reduce bending      General Comments General comments (skin integrity, edema, etc.): VSS on RA    Exercises     Assessment/Plan    PT Assessment Patient needs continued PT services  PT Problem List Decreased strength;Decreased activity tolerance;Decreased balance;Decreased mobility;Decreased knowledge of use of DME;Decreased safety awareness;Decreased knowledge of precautions       PT Treatment Interventions DME instruction;Gait training;Stair training;Functional mobility training;Therapeutic activities;Therapeutic exercise;Balance training;Neuromuscular re-education;Patient/family education    PT Goals (Current goals can be found in the Care Plan section)  Acute Rehab PT Goals Patient Stated Goal: to go home PT Goal Formulation: With patient Time For Goal Achievement: 02/11/21 Potential to Achieve Goals: Good    Frequency Min 5X/week   Barriers to discharge        Co-evaluation               AM-PAC PT "6 Clicks" Mobility  Outcome Measure Help needed turning  from your back to your side while in a flat bed without using bedrails?: A Little Help needed moving from lying on your back to sitting on the side of a flat bed without using bedrails?: A Little Help needed moving to and from a bed to a chair (including a wheelchair)?: A Little Help needed standing up from a chair using your arms (e.g., wheelchair or bedside chair)?: A Little Help needed to walk in hospital room?: A Little Help needed climbing 3-5 steps with a railing? : A Little 6 Click Score: 18    End of Session   Activity Tolerance: Patient tolerated treatment well Patient left: in bed;with call bell/phone within reach Nurse Communication: Mobility status PT Visit Diagnosis: Other abnormalities of gait and mobility (R26.89);Muscle weakness (generalized) (M62.81);Difficulty in walking, not elsewhere classified (R26.2)    Time: 4782-9562 PT Time Calculation (min) (ACUTE ONLY): 22 min   Charges:   PT Evaluation $PT Eval Low Complexity: 1 Low          Zenaida Niece, PT, DPT Acute Rehabilitation Pager: [phone]   Zenaida Niece 01/28/2021, 10:30 AM

## 2021-01-31 ENCOUNTER — Other Ambulatory Visit: Payer: Self-pay

## 2021-01-31 ENCOUNTER — Other Ambulatory Visit (HOSPITAL_COMMUNITY): Payer: Self-pay | Admitting: Adult Health

## 2021-01-31 ENCOUNTER — Ambulatory Visit (HOSPITAL_COMMUNITY)
Admission: RE | Admit: 2021-01-31 | Discharge: 2021-01-31 | Disposition: A | Discharge status: Home / Self Care | Payer: Medicare Other | Source: Ambulatory Visit | Attending: Internal Medicine | Admitting: Internal Medicine

## 2021-01-31 DIAGNOSIS — R600 Localized edema: Secondary | ICD-10-CM | POA: Insufficient documentation

## 2021-02-01 NOTE — Discharge Summary (Addendum)
Physician Discharge Summary  Patient ID: [name] MRN: [id number] DOB/AGE: 83-19-1937 83 y.o.  Admit date: 01/27/2021 Discharge date: 01/28/2021 Admission Diagnoses: Lumbar spondylosis with radiculopathy L5-S1 left.  History of fusion L1 L5  Discharge Diagnoses: Lumbar spondylosis with radiculopathy L5-S1 left.  History of lumbar fusion L1 L5. Active Problems:   Lumbar radiculopathy, chronic   Discharged Condition: good  Hospital Course: Patient was admitted to undergo surgical decompression at L5-S1 on the left side tolerated surgery well.  Consults: None  Significant Diagnostic Studies: None  Treatments: surgery: Laminotomy foraminotomies L5-S1 left with decompression the L5 and S1 nerve roots  Discharge Exam: Blood pressure 118/65, pulse (!) 110, temperature 98 F (36.7 C), temperature source Oral, resp. rate 17, height 6\' 2"  (1.88 m), weight 113.4 kg, SpO2 96 %. Incision is clean and dry Station and gait are intact  Disposition: Discharge disposition: 01-Home or Self Care       Discharge Instructions    Ambulatory referral to Physical Therapy   Complete by: As directed    Iontophoresis - 4 mg/ml of dexamethasone: No   T.E.N.S. Unit Evaluation and Dispense as Indicated: No   Call MD for:  redness, tenderness, or signs of infection (pain, swelling, redness, odor or green/yellow discharge around incision site)   Complete by: As directed    Call MD for:  severe uncontrolled pain   Complete by: As directed    Call MD for:  temperature >100.4   Complete by: As directed    Diet - low sodium heart healthy   Complete by: As directed    Discharge wound care:   Complete by: As directed    Okay to shower. Do not apply salves or appointments to incision. No heavy lifting with the upper extremities greater than 10 pounds. May resume driving when not requiring pain medication and patient feels comfortable with doing so.   Incentive spirometry RT   Complete by: As  directed    Increase activity slowly   Complete by: As directed      Allergies as of 01/28/2021   No Known Allergies     Medication List    TAKE these medications   acetaminophen 325 MG tablet Commonly known as: TYLENOL Take 2 tablets (650 mg total) by mouth every 6 (six) hours as needed for mild pain (or Fever >/= 101).   aspirin 81 MG tablet Take 81 mg by mouth daily.   beta carotene 25000 UNIT capsule Take 25,000 Units by mouth daily.   cholecalciferol 1000 units tablet Commonly known as: VITAMIN D Take 1,000 Units by mouth daily.   doxycycline 50 MG capsule Commonly known as: VIBRAMYCIN Take 2 capsules (100 mg total) by mouth 2 (two) times daily.   EPA PO Take 1 g by mouth daily.   gabapentin 300 MG capsule Commonly known as: NEURONTIN Take 300 mg by mouth daily.   Garlic 865 MG Caps Take 500 mg by mouth daily.   GLUCOSAMINE CHONDROITIN JOINT PO Take 1 tablet by mouth daily.   lidocaine 5 % Commonly known as: LIDODERM Place 1 patch onto the skin daily as needed (pain).   meloxicam 15 MG tablet Commonly known as: MOBIC Take 15 mg by mouth daily.   methocarbamol 500 MG tablet Commonly known as: ROBAXIN Take 500 mg by mouth every 8 (eight) hours as needed for muscle spasms. What changed: Another medication with the same name was added. Make sure you understand how and when to take each.   methocarbamol  500 MG tablet Commonly known as: ROBAXIN Take 1 tablet (500 mg total) by mouth every 6 (six) hours as needed for muscle spasms. What changed: You were already taking a medication with the same name, and this prescription was added. Make sure you understand how and when to take each.   multivitamin with minerals Tabs tablet Take 1 tablet by mouth daily.   naproxen sodium 220 MG tablet Commonly known as: ALEVE Take 220 mg by mouth 2 (two) times daily as needed (pain).   OVER THE COUNTER MEDICATION Take 1 tablet by mouth daily. Balance b50 daily    oxyCODONE-acetaminophen 5-325 MG tablet Commonly known as: PERCOCET/ROXICET Take 1-2 tablets by mouth every 6 (six) hours as needed for moderate pain or severe pain.   PROBIOTIC ADVANCED PO Take 1 capsule by mouth daily.   simvastatin 40 MG tablet Commonly known as: ZOCOR Take 40 mg by mouth daily with breakfast.   Soya Lecithin 1200 MG Caps Take 1,200 mg by mouth daily.   STOOL SOFTENER PO Take 100 mg by mouth daily.   tolterodine 4 MG 24 hr capsule Commonly known as: DETROL LA Take 4 mg by mouth daily.   traMADol 50 MG tablet Commonly known as: ULTRAM Take 100 mg by mouth 2 (two) times daily as needed for severe pain.   vitamin C 500 MG tablet Commonly known as: ASCORBIC ACID Take 500 mg by mouth daily.   vitamin E 180 MG (400 UNITS) capsule Take 400 Units by mouth daily.            Discharge Care Instructions  (From admission, onward)         Start     Ordered   01/28/21 0000  Discharge wound care:       Comments: Okay to shower. Do not apply salves or appointments to incision. No heavy lifting with the upper extremities greater than 10 pounds. May resume driving when not requiring pain medication and patient feels comfortable with doing so.   01/28/21 1046          Follow-up Information    Kristeen Miss, MD Follow up.   Specialty: Neurosurgery Contact information: [address] [place] [address] [phone]        [facility] Follow up.   Specialty: Rehabilitation Contact information: [place] [address] [place], [place] [id number] [place] [phone]              Signed: Earleen Newport 02/01/2021, 12:29 PM

## 2021-02-02 ENCOUNTER — Ambulatory Visit: Payer: Medicare Other | Attending: Neurological Surgery

## 2021-02-02 ENCOUNTER — Other Ambulatory Visit: Payer: Self-pay

## 2021-02-02 DIAGNOSIS — R2689 Other abnormalities of gait and mobility: Secondary | ICD-10-CM | POA: Diagnosis not present

## 2021-02-02 DIAGNOSIS — G8929 Other chronic pain: Secondary | ICD-10-CM | POA: Insufficient documentation

## 2021-02-02 DIAGNOSIS — R29898 Other symptoms and signs involving the musculoskeletal system: Secondary | ICD-10-CM | POA: Diagnosis present

## 2021-02-02 DIAGNOSIS — R2681 Unsteadiness on feet: Secondary | ICD-10-CM | POA: Diagnosis present

## 2021-02-02 DIAGNOSIS — M545 Low back pain, unspecified: Secondary | ICD-10-CM | POA: Diagnosis present

## 2021-02-02 DIAGNOSIS — R262 Difficulty in walking, not elsewhere classified: Secondary | ICD-10-CM | POA: Diagnosis present

## 2021-02-02 DIAGNOSIS — M6281 Muscle weakness (generalized): Secondary | ICD-10-CM | POA: Diagnosis present
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
## 2021-02-02 NOTE — Patient Instructions (Signed)
Access Code: [email or web address] URL: [email or web address] Date: 02/02/2021 Prepared by: Claiborne Billings  Exercises Seated Long Arc Quad - 3 x daily - 7 x weekly - 10 reps - 2 sets - 5 hold Seated March - 3 x daily - 7 x weekly - 10 reps - 3 sets Seated Heel Toe Raises - 3 x daily - 7 x weekly - 10 reps - 2 sets Neutral Posture With Walker - 1 x daily - 7 x weekly - 3 sets - 10 reps  Patient Education Scar Massage

## 2021-02-03 ENCOUNTER — Ambulatory Visit: Payer: Medicare Other

## 2021-02-03 DIAGNOSIS — M545 Low back pain, unspecified: Secondary | ICD-10-CM

## 2021-02-03 DIAGNOSIS — R2689 Other abnormalities of gait and mobility: Secondary | ICD-10-CM

## 2021-02-03 DIAGNOSIS — R2681 Unsteadiness on feet: Secondary | ICD-10-CM

## 2021-02-03 DIAGNOSIS — M6281 Muscle weakness (generalized): Secondary | ICD-10-CM
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
## 2021-02-07 ENCOUNTER — Other Ambulatory Visit: Payer: Self-pay

## 2021-02-07 ENCOUNTER — Encounter: Payer: Self-pay | Admitting: Physical Therapy

## 2021-02-07 ENCOUNTER — Ambulatory Visit: Payer: Medicare Other | Admitting: Physical Therapy

## 2021-02-07 DIAGNOSIS — R2689 Other abnormalities of gait and mobility: Secondary | ICD-10-CM | POA: Diagnosis not present

## 2021-02-07 DIAGNOSIS — M545 Low back pain, unspecified: Secondary | ICD-10-CM

## 2021-02-07 DIAGNOSIS — M6281 Muscle weakness (generalized): Secondary | ICD-10-CM

## 2021-02-07 DIAGNOSIS — R2681 Unsteadiness on feet: Secondary | ICD-10-CM
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
## 2021-02-09 ENCOUNTER — Encounter: Payer: Self-pay | Admitting: Physical Therapy

## 2021-02-09 ENCOUNTER — Ambulatory Visit: Payer: Medicare Other | Admitting: Physical Therapy

## 2021-02-09 ENCOUNTER — Other Ambulatory Visit: Payer: Self-pay

## 2021-02-09 DIAGNOSIS — R2689 Other abnormalities of gait and mobility: Secondary | ICD-10-CM | POA: Diagnosis not present

## 2021-02-09 DIAGNOSIS — R29898 Other symptoms and signs involving the musculoskeletal system: Secondary | ICD-10-CM

## 2021-02-09 DIAGNOSIS — M545 Low back pain, unspecified: Secondary | ICD-10-CM

## 2021-02-09 DIAGNOSIS — R2681 Unsteadiness on feet: Secondary | ICD-10-CM

## 2021-02-09 DIAGNOSIS — M6281 Muscle weakness (generalized): Secondary | ICD-10-CM

## 2021-02-09 DIAGNOSIS — R262 Difficulty in walking, not elsewhere classified: Secondary | ICD-10-CM
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
## 2021-02-11 ENCOUNTER — Other Ambulatory Visit: Payer: Self-pay

## 2021-02-11 ENCOUNTER — Ambulatory Visit: Payer: Medicare Other | Admitting: Physical Therapy

## 2021-02-11 ENCOUNTER — Encounter: Payer: Self-pay | Admitting: Physical Therapy

## 2021-02-11 DIAGNOSIS — R2681 Unsteadiness on feet: Secondary | ICD-10-CM

## 2021-02-11 DIAGNOSIS — R262 Difficulty in walking, not elsewhere classified: Secondary | ICD-10-CM

## 2021-02-11 DIAGNOSIS — R29898 Other symptoms and signs involving the musculoskeletal system: Secondary | ICD-10-CM

## 2021-02-11 DIAGNOSIS — M545 Low back pain, unspecified: Secondary | ICD-10-CM

## 2021-02-11 DIAGNOSIS — M6281 Muscle weakness (generalized): Secondary | ICD-10-CM

## 2021-02-11 DIAGNOSIS — R2689 Other abnormalities of gait and mobility: Secondary | ICD-10-CM
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
## 2021-02-15 ENCOUNTER — Ambulatory Visit: Payer: Medicare Other | Attending: Neurological Surgery | Admitting: Physical Therapy

## 2021-02-15 ENCOUNTER — Encounter: Payer: Self-pay | Admitting: Physical Therapy

## 2021-02-15 ENCOUNTER — Other Ambulatory Visit: Payer: Self-pay

## 2021-02-15 DIAGNOSIS — R29898 Other symptoms and signs involving the musculoskeletal system: Secondary | ICD-10-CM | POA: Diagnosis present

## 2021-02-15 DIAGNOSIS — R2681 Unsteadiness on feet: Secondary | ICD-10-CM | POA: Insufficient documentation

## 2021-02-15 DIAGNOSIS — R2689 Other abnormalities of gait and mobility: Secondary | ICD-10-CM | POA: Diagnosis present

## 2021-02-15 DIAGNOSIS — R262 Difficulty in walking, not elsewhere classified: Secondary | ICD-10-CM | POA: Diagnosis present

## 2021-02-15 DIAGNOSIS — M545 Low back pain, unspecified: Secondary | ICD-10-CM | POA: Insufficient documentation

## 2021-02-15 DIAGNOSIS — G8929 Other chronic pain: Secondary | ICD-10-CM | POA: Insufficient documentation

## 2021-02-15 DIAGNOSIS — M6281 Muscle weakness (generalized): Secondary | ICD-10-CM | POA: Insufficient documentation
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
## 2021-02-16 ENCOUNTER — Ambulatory Visit: Payer: Medicare Other | Admitting: Physical Therapy

## 2021-02-16 ENCOUNTER — Encounter: Payer: Self-pay | Admitting: Physical Therapy

## 2021-02-16 DIAGNOSIS — R29898 Other symptoms and signs involving the musculoskeletal system: Secondary | ICD-10-CM

## 2021-02-16 DIAGNOSIS — M6281 Muscle weakness (generalized): Secondary | ICD-10-CM

## 2021-02-16 DIAGNOSIS — R2689 Other abnormalities of gait and mobility: Secondary | ICD-10-CM

## 2021-02-16 DIAGNOSIS — R2681 Unsteadiness on feet: Secondary | ICD-10-CM

## 2021-02-16 DIAGNOSIS — G8929 Other chronic pain: Secondary | ICD-10-CM

## 2021-02-16 DIAGNOSIS — R262 Difficulty in walking, not elsewhere classified: Secondary | ICD-10-CM

## 2021-02-16 DIAGNOSIS — M545 Low back pain, unspecified: Secondary | ICD-10-CM
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
## 2021-02-17 ENCOUNTER — Other Ambulatory Visit: Payer: Self-pay

## 2021-02-17 ENCOUNTER — Ambulatory Visit: Payer: Medicare Other | Admitting: Physical Therapy

## 2021-02-17 ENCOUNTER — Encounter: Payer: Self-pay | Admitting: Physical Therapy

## 2021-02-17 DIAGNOSIS — M545 Low back pain, unspecified: Secondary | ICD-10-CM

## 2021-02-17 DIAGNOSIS — R2689 Other abnormalities of gait and mobility: Secondary | ICD-10-CM | POA: Diagnosis not present

## 2021-02-17 DIAGNOSIS — M6281 Muscle weakness (generalized): Secondary | ICD-10-CM

## 2021-02-17 DIAGNOSIS — R2681 Unsteadiness on feet: Secondary | ICD-10-CM
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
## 2021-02-21 ENCOUNTER — Encounter: Payer: Self-pay | Admitting: Physical Therapy

## 2021-02-21 ENCOUNTER — Other Ambulatory Visit: Payer: Self-pay

## 2021-02-21 ENCOUNTER — Ambulatory Visit: Payer: Medicare Other | Admitting: Physical Therapy

## 2021-02-21 DIAGNOSIS — R2689 Other abnormalities of gait and mobility: Secondary | ICD-10-CM

## 2021-02-21 DIAGNOSIS — M6281 Muscle weakness (generalized): Secondary | ICD-10-CM

## 2021-02-21 DIAGNOSIS — R262 Difficulty in walking, not elsewhere classified: Secondary | ICD-10-CM

## 2021-02-21 DIAGNOSIS — R2681 Unsteadiness on feet: Secondary | ICD-10-CM

## 2021-02-21 DIAGNOSIS — M545 Low back pain, unspecified: Secondary | ICD-10-CM
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
## 2021-02-23 ENCOUNTER — Ambulatory Visit: Payer: Medicare Other | Admitting: Physical Therapy

## 2021-02-23 ENCOUNTER — Other Ambulatory Visit: Payer: Self-pay

## 2021-02-23 ENCOUNTER — Encounter: Payer: Self-pay | Admitting: Physical Therapy

## 2021-02-23 DIAGNOSIS — R262 Difficulty in walking, not elsewhere classified: Secondary | ICD-10-CM

## 2021-02-23 DIAGNOSIS — R2689 Other abnormalities of gait and mobility: Secondary | ICD-10-CM

## 2021-02-23 DIAGNOSIS — R2681 Unsteadiness on feet: Secondary | ICD-10-CM

## 2021-02-23 DIAGNOSIS — G8929 Other chronic pain: Secondary | ICD-10-CM

## 2021-02-23 DIAGNOSIS — R29898 Other symptoms and signs involving the musculoskeletal system: Secondary | ICD-10-CM

## 2021-02-23 DIAGNOSIS — M6281 Muscle weakness (generalized): Secondary | ICD-10-CM

## 2021-02-23 DIAGNOSIS — M545 Low back pain, unspecified: Secondary | ICD-10-CM
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
## 2021-02-23 NOTE — Patient Instructions (Signed)
Access Code: [email or web address] URL: [email or web address] Date: 02/23/2021 Prepared by: Claiborne Billings  Exercises  Heel rises with counter support - 3 x daily - 7 x weekly - 2 sets - 10 reps Standing Hip Flexion March - 1 x daily - 7 x weekly - 3 sets - 10 reps Standing Hip Abduction with Counter Support - 1 x daily - 7 x weekly - 3 sets - 10 reps  Patient Education Scar Massage

## 2021-02-25 ENCOUNTER — Ambulatory Visit: Payer: Medicare Other | Admitting: Physical Therapy

## 2021-02-25 ENCOUNTER — Other Ambulatory Visit: Payer: Self-pay

## 2021-02-25 ENCOUNTER — Encounter: Payer: Self-pay | Admitting: Physical Therapy

## 2021-02-25 DIAGNOSIS — M6281 Muscle weakness (generalized): Secondary | ICD-10-CM

## 2021-02-25 DIAGNOSIS — R2681 Unsteadiness on feet: Secondary | ICD-10-CM

## 2021-02-25 DIAGNOSIS — R262 Difficulty in walking, not elsewhere classified: Secondary | ICD-10-CM

## 2021-02-25 DIAGNOSIS — R2689 Other abnormalities of gait and mobility: Secondary | ICD-10-CM | POA: Diagnosis not present

## 2021-02-25 DIAGNOSIS — R29898 Other symptoms and signs involving the musculoskeletal system: Secondary | ICD-10-CM

## 2021-02-25 DIAGNOSIS — M545 Low back pain, unspecified: Secondary | ICD-10-CM
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
## 2021-02-28 ENCOUNTER — Other Ambulatory Visit: Payer: Self-pay

## 2021-02-28 ENCOUNTER — Ambulatory Visit: Payer: Medicare Other | Admitting: Physical Therapy

## 2021-02-28 ENCOUNTER — Encounter: Payer: Self-pay | Admitting: Physical Therapy

## 2021-02-28 DIAGNOSIS — R2689 Other abnormalities of gait and mobility: Secondary | ICD-10-CM | POA: Diagnosis not present

## 2021-02-28 DIAGNOSIS — G8929 Other chronic pain: Secondary | ICD-10-CM

## 2021-02-28 DIAGNOSIS — M6281 Muscle weakness (generalized): Secondary | ICD-10-CM

## 2021-02-28 DIAGNOSIS — R262 Difficulty in walking, not elsewhere classified: Secondary | ICD-10-CM

## 2021-02-28 DIAGNOSIS — M545 Low back pain, unspecified: Secondary | ICD-10-CM

## 2021-02-28 DIAGNOSIS — R2681 Unsteadiness on feet: Secondary | ICD-10-CM

## 2021-02-28 DIAGNOSIS — R29898 Other symptoms and signs involving the musculoskeletal system: Secondary | ICD-10-CM
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
## 2021-03-02 ENCOUNTER — Other Ambulatory Visit: Payer: Self-pay

## 2021-03-02 ENCOUNTER — Ambulatory Visit: Payer: Medicare Other

## 2021-03-02 DIAGNOSIS — M6281 Muscle weakness (generalized): Secondary | ICD-10-CM

## 2021-03-02 DIAGNOSIS — R2681 Unsteadiness on feet: Secondary | ICD-10-CM

## 2021-03-02 DIAGNOSIS — R2689 Other abnormalities of gait and mobility: Secondary | ICD-10-CM
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
## 2021-03-04 ENCOUNTER — Other Ambulatory Visit: Payer: Self-pay

## 2021-03-04 ENCOUNTER — Encounter: Payer: Self-pay | Admitting: Physical Therapy

## 2021-03-04 ENCOUNTER — Ambulatory Visit: Payer: Medicare Other | Admitting: Physical Therapy

## 2021-03-04 DIAGNOSIS — R2689 Other abnormalities of gait and mobility: Secondary | ICD-10-CM | POA: Diagnosis not present

## 2021-03-04 DIAGNOSIS — M545 Low back pain, unspecified: Secondary | ICD-10-CM

## 2021-03-04 DIAGNOSIS — R2681 Unsteadiness on feet: Secondary | ICD-10-CM

## 2021-03-04 DIAGNOSIS — M6281 Muscle weakness (generalized): Secondary | ICD-10-CM
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
## 2021-03-07 ENCOUNTER — Ambulatory Visit: Payer: Medicare Other | Admitting: Physical Therapy

## 2021-03-07 ENCOUNTER — Encounter: Payer: Self-pay | Admitting: Physical Therapy

## 2021-03-07 ENCOUNTER — Other Ambulatory Visit: Payer: Self-pay

## 2021-03-07 DIAGNOSIS — G8929 Other chronic pain: Secondary | ICD-10-CM

## 2021-03-07 DIAGNOSIS — R2689 Other abnormalities of gait and mobility: Secondary | ICD-10-CM | POA: Diagnosis not present

## 2021-03-07 DIAGNOSIS — M545 Low back pain, unspecified: Secondary | ICD-10-CM

## 2021-03-07 DIAGNOSIS — R262 Difficulty in walking, not elsewhere classified: Secondary | ICD-10-CM

## 2021-03-07 DIAGNOSIS — M6281 Muscle weakness (generalized): Secondary | ICD-10-CM

## 2021-03-07 DIAGNOSIS — R2681 Unsteadiness on feet: Secondary | ICD-10-CM
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
## 2021-03-09 ENCOUNTER — Other Ambulatory Visit: Payer: Self-pay

## 2021-03-09 ENCOUNTER — Ambulatory Visit: Payer: Medicare Other

## 2021-03-09 DIAGNOSIS — G8929 Other chronic pain: Secondary | ICD-10-CM

## 2021-03-09 DIAGNOSIS — M6281 Muscle weakness (generalized): Secondary | ICD-10-CM

## 2021-03-09 DIAGNOSIS — R2689 Other abnormalities of gait and mobility: Secondary | ICD-10-CM

## 2021-03-09 DIAGNOSIS — R2681 Unsteadiness on feet: Secondary | ICD-10-CM
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
## 2021-03-11 ENCOUNTER — Ambulatory Visit: Payer: Medicare Other | Admitting: Physical Therapy

## 2021-03-11 ENCOUNTER — Other Ambulatory Visit: Payer: Self-pay

## 2021-03-11 ENCOUNTER — Encounter: Payer: Self-pay | Admitting: Physical Therapy

## 2021-03-11 DIAGNOSIS — R2681 Unsteadiness on feet: Secondary | ICD-10-CM

## 2021-03-11 DIAGNOSIS — R2689 Other abnormalities of gait and mobility: Secondary | ICD-10-CM | POA: Diagnosis not present

## 2021-03-11 DIAGNOSIS — M6281 Muscle weakness (generalized): Secondary | ICD-10-CM
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
## 2021-03-14 ENCOUNTER — Ambulatory Visit: Payer: Medicare Other | Admitting: Physical Therapy

## 2021-03-14 ENCOUNTER — Encounter: Payer: Self-pay | Admitting: Physical Therapy

## 2021-03-14 ENCOUNTER — Other Ambulatory Visit: Payer: Self-pay

## 2021-03-14 DIAGNOSIS — R2681 Unsteadiness on feet: Secondary | ICD-10-CM

## 2021-03-14 DIAGNOSIS — R2689 Other abnormalities of gait and mobility: Secondary | ICD-10-CM | POA: Diagnosis not present

## 2021-03-14 DIAGNOSIS — M6281 Muscle weakness (generalized): Secondary | ICD-10-CM

## 2021-03-14 DIAGNOSIS — M545 Low back pain, unspecified: Secondary | ICD-10-CM
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
## 2021-03-16 ENCOUNTER — Ambulatory Visit: Payer: Medicare Other | Admitting: Physical Therapy

## 2021-03-16 ENCOUNTER — Encounter: Payer: Self-pay | Admitting: Physical Therapy

## 2021-03-16 ENCOUNTER — Other Ambulatory Visit: Payer: Self-pay

## 2021-03-16 DIAGNOSIS — M545 Low back pain, unspecified: Secondary | ICD-10-CM

## 2021-03-16 DIAGNOSIS — G8929 Other chronic pain: Secondary | ICD-10-CM

## 2021-03-16 DIAGNOSIS — R29898 Other symptoms and signs involving the musculoskeletal system: Secondary | ICD-10-CM

## 2021-03-16 DIAGNOSIS — R2681 Unsteadiness on feet: Secondary | ICD-10-CM

## 2021-03-16 DIAGNOSIS — M6281 Muscle weakness (generalized): Secondary | ICD-10-CM

## 2021-03-16 DIAGNOSIS — R2689 Other abnormalities of gait and mobility: Secondary | ICD-10-CM

## 2021-03-16 DIAGNOSIS — R262 Difficulty in walking, not elsewhere classified: Secondary | ICD-10-CM
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
## 2021-03-18 ENCOUNTER — Ambulatory Visit: Payer: Medicare Other | Attending: Neurological Surgery | Admitting: Physical Therapy

## 2021-03-18 ENCOUNTER — Other Ambulatory Visit: Payer: Self-pay

## 2021-03-18 DIAGNOSIS — R2689 Other abnormalities of gait and mobility: Secondary | ICD-10-CM | POA: Diagnosis present

## 2021-03-18 DIAGNOSIS — M6281 Muscle weakness (generalized): Secondary | ICD-10-CM | POA: Insufficient documentation

## 2021-03-18 DIAGNOSIS — G8929 Other chronic pain: Secondary | ICD-10-CM | POA: Diagnosis present

## 2021-03-18 DIAGNOSIS — M545 Low back pain, unspecified: Secondary | ICD-10-CM | POA: Insufficient documentation

## 2021-03-18 DIAGNOSIS — R2681 Unsteadiness on feet: Secondary | ICD-10-CM | POA: Diagnosis not present

## 2021-03-18 DIAGNOSIS — R262 Difficulty in walking, not elsewhere classified: Secondary | ICD-10-CM | POA: Insufficient documentation

## 2021-03-18 DIAGNOSIS — R29898 Other symptoms and signs involving the musculoskeletal system: Secondary | ICD-10-CM | POA: Diagnosis present
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
## 2021-03-21 ENCOUNTER — Other Ambulatory Visit: Payer: Self-pay

## 2021-03-21 ENCOUNTER — Ambulatory Visit: Payer: Medicare Other | Admitting: Physical Therapy

## 2021-03-21 ENCOUNTER — Encounter: Payer: Self-pay | Admitting: Physical Therapy

## 2021-03-21 DIAGNOSIS — G8929 Other chronic pain: Secondary | ICD-10-CM

## 2021-03-21 DIAGNOSIS — R2681 Unsteadiness on feet: Secondary | ICD-10-CM | POA: Diagnosis not present

## 2021-03-21 DIAGNOSIS — R262 Difficulty in walking, not elsewhere classified: Secondary | ICD-10-CM

## 2021-03-21 DIAGNOSIS — R2689 Other abnormalities of gait and mobility: Secondary | ICD-10-CM

## 2021-03-21 DIAGNOSIS — M545 Low back pain, unspecified: Secondary | ICD-10-CM

## 2021-03-21 DIAGNOSIS — M6281 Muscle weakness (generalized): Secondary | ICD-10-CM

## 2021-03-21 DIAGNOSIS — R29898 Other symptoms and signs involving the musculoskeletal system: Secondary | ICD-10-CM
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
## 2021-03-23 ENCOUNTER — Encounter: Payer: Self-pay | Admitting: Physical Therapy

## 2021-03-23 ENCOUNTER — Ambulatory Visit: Payer: Medicare Other | Admitting: Physical Therapy

## 2021-03-23 ENCOUNTER — Other Ambulatory Visit: Payer: Self-pay

## 2021-03-23 DIAGNOSIS — R262 Difficulty in walking, not elsewhere classified: Secondary | ICD-10-CM

## 2021-03-23 DIAGNOSIS — M6281 Muscle weakness (generalized): Secondary | ICD-10-CM

## 2021-03-23 DIAGNOSIS — R2689 Other abnormalities of gait and mobility: Secondary | ICD-10-CM

## 2021-03-23 DIAGNOSIS — R2681 Unsteadiness on feet: Secondary | ICD-10-CM | POA: Diagnosis not present

## 2021-03-23 DIAGNOSIS — M545 Low back pain, unspecified: Secondary | ICD-10-CM
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
## 2021-03-25 ENCOUNTER — Other Ambulatory Visit: Payer: Self-pay

## 2021-03-25 ENCOUNTER — Ambulatory Visit: Payer: Medicare Other | Admitting: Physical Therapy

## 2021-03-25 DIAGNOSIS — M6281 Muscle weakness (generalized): Secondary | ICD-10-CM

## 2021-03-25 DIAGNOSIS — G8929 Other chronic pain: Secondary | ICD-10-CM

## 2021-03-25 DIAGNOSIS — R262 Difficulty in walking, not elsewhere classified: Secondary | ICD-10-CM

## 2021-03-25 DIAGNOSIS — R2681 Unsteadiness on feet: Secondary | ICD-10-CM

## 2021-03-25 DIAGNOSIS — R2689 Other abnormalities of gait and mobility: Secondary | ICD-10-CM
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
## 2021-03-28 ENCOUNTER — Other Ambulatory Visit: Payer: Self-pay

## 2021-03-28 ENCOUNTER — Encounter: Payer: Self-pay | Admitting: Physical Therapy

## 2021-03-28 ENCOUNTER — Ambulatory Visit: Payer: Medicare Other | Admitting: Physical Therapy

## 2021-03-28 DIAGNOSIS — R2681 Unsteadiness on feet: Secondary | ICD-10-CM

## 2021-03-28 DIAGNOSIS — R262 Difficulty in walking, not elsewhere classified: Secondary | ICD-10-CM

## 2021-03-28 DIAGNOSIS — M6281 Muscle weakness (generalized): Secondary | ICD-10-CM

## 2021-03-28 DIAGNOSIS — R2689 Other abnormalities of gait and mobility: Secondary | ICD-10-CM

## 2021-03-28 DIAGNOSIS — M545 Low back pain, unspecified: Secondary | ICD-10-CM

## 2021-03-28 DIAGNOSIS — G8929 Other chronic pain: Secondary | ICD-10-CM
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
## 2021-03-29 ENCOUNTER — Ambulatory Visit: Payer: Medicare Other | Admitting: Physical Therapy

## 2021-03-29 ENCOUNTER — Other Ambulatory Visit: Payer: Self-pay

## 2021-03-29 DIAGNOSIS — R262 Difficulty in walking, not elsewhere classified: Secondary | ICD-10-CM

## 2021-03-29 DIAGNOSIS — G8929 Other chronic pain: Secondary | ICD-10-CM

## 2021-03-29 DIAGNOSIS — R2689 Other abnormalities of gait and mobility: Secondary | ICD-10-CM

## 2021-03-29 DIAGNOSIS — M6281 Muscle weakness (generalized): Secondary | ICD-10-CM

## 2021-03-29 DIAGNOSIS — R2681 Unsteadiness on feet: Secondary | ICD-10-CM

## 2021-03-29 DIAGNOSIS — M545 Low back pain, unspecified: Secondary | ICD-10-CM
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
## 2021-03-31 ENCOUNTER — Encounter: Payer: Medicare Other | Admitting: Physical Therapy

## 2021-04-04 ENCOUNTER — Encounter: Payer: Self-pay | Admitting: Physical Therapy

## 2021-04-04 ENCOUNTER — Ambulatory Visit: Payer: Medicare Other | Admitting: Physical Therapy

## 2021-04-04 ENCOUNTER — Other Ambulatory Visit: Payer: Self-pay

## 2021-04-04 DIAGNOSIS — G8929 Other chronic pain: Secondary | ICD-10-CM

## 2021-04-04 DIAGNOSIS — R2681 Unsteadiness on feet: Secondary | ICD-10-CM | POA: Diagnosis not present

## 2021-04-04 DIAGNOSIS — R2689 Other abnormalities of gait and mobility: Secondary | ICD-10-CM

## 2021-04-04 DIAGNOSIS — R262 Difficulty in walking, not elsewhere classified: Secondary | ICD-10-CM

## 2021-04-04 DIAGNOSIS — M6281 Muscle weakness (generalized): Secondary | ICD-10-CM

## 2021-04-04 DIAGNOSIS — R29898 Other symptoms and signs involving the musculoskeletal system: Secondary | ICD-10-CM
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
## 2021-04-11 ENCOUNTER — Other Ambulatory Visit: Payer: Self-pay

## 2021-04-11 ENCOUNTER — Ambulatory Visit: Payer: Medicare Other

## 2021-04-11 DIAGNOSIS — R2689 Other abnormalities of gait and mobility: Secondary | ICD-10-CM

## 2021-04-11 DIAGNOSIS — G8929 Other chronic pain: Secondary | ICD-10-CM

## 2021-04-11 DIAGNOSIS — R262 Difficulty in walking, not elsewhere classified: Secondary | ICD-10-CM

## 2021-04-11 DIAGNOSIS — R2681 Unsteadiness on feet: Secondary | ICD-10-CM | POA: Diagnosis not present

## 2021-04-11 DIAGNOSIS — M6281 Muscle weakness (generalized): Secondary | ICD-10-CM
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
## 2021-04-13 ENCOUNTER — Other Ambulatory Visit: Payer: Self-pay

## 2021-04-13 ENCOUNTER — Ambulatory Visit: Payer: Medicare Other

## 2021-04-13 DIAGNOSIS — M6281 Muscle weakness (generalized): Secondary | ICD-10-CM

## 2021-04-13 DIAGNOSIS — G8929 Other chronic pain: Secondary | ICD-10-CM

## 2021-04-13 DIAGNOSIS — M545 Low back pain, unspecified: Secondary | ICD-10-CM

## 2021-04-13 DIAGNOSIS — R262 Difficulty in walking, not elsewhere classified: Secondary | ICD-10-CM

## 2021-04-13 DIAGNOSIS — R2681 Unsteadiness on feet: Secondary | ICD-10-CM

## 2021-04-13 DIAGNOSIS — R2689 Other abnormalities of gait and mobility: Secondary | ICD-10-CM
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
## 2021-04-18 ENCOUNTER — Other Ambulatory Visit: Payer: Self-pay

## 2021-04-18 ENCOUNTER — Ambulatory Visit: Payer: Medicare Other | Attending: Neurological Surgery

## 2021-04-18 DIAGNOSIS — M545 Low back pain, unspecified: Secondary | ICD-10-CM | POA: Insufficient documentation

## 2021-04-18 DIAGNOSIS — R2681 Unsteadiness on feet: Secondary | ICD-10-CM | POA: Diagnosis present

## 2021-04-18 DIAGNOSIS — G8929 Other chronic pain: Secondary | ICD-10-CM | POA: Insufficient documentation

## 2021-04-18 DIAGNOSIS — R2689 Other abnormalities of gait and mobility: Secondary | ICD-10-CM | POA: Insufficient documentation

## 2021-04-18 DIAGNOSIS — R29898 Other symptoms and signs involving the musculoskeletal system: Secondary | ICD-10-CM | POA: Insufficient documentation

## 2021-04-18 DIAGNOSIS — R262 Difficulty in walking, not elsewhere classified: Secondary | ICD-10-CM | POA: Diagnosis present

## 2021-04-18 DIAGNOSIS — M6281 Muscle weakness (generalized): Secondary | ICD-10-CM | POA: Insufficient documentation
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
## 2021-04-20 ENCOUNTER — Other Ambulatory Visit: Payer: Self-pay

## 2021-04-20 ENCOUNTER — Ambulatory Visit: Payer: Medicare Other | Admitting: Physical Therapy

## 2021-04-20 ENCOUNTER — Encounter: Payer: Self-pay | Admitting: Physical Therapy

## 2021-04-20 DIAGNOSIS — R2681 Unsteadiness on feet: Secondary | ICD-10-CM

## 2021-04-20 DIAGNOSIS — R2689 Other abnormalities of gait and mobility: Secondary | ICD-10-CM | POA: Diagnosis not present

## 2021-04-20 DIAGNOSIS — M545 Low back pain, unspecified: Secondary | ICD-10-CM

## 2021-04-20 DIAGNOSIS — G8929 Other chronic pain: Secondary | ICD-10-CM

## 2021-04-20 DIAGNOSIS — M6281 Muscle weakness (generalized): Secondary | ICD-10-CM

## 2021-04-20 DIAGNOSIS — R262 Difficulty in walking, not elsewhere classified: Secondary | ICD-10-CM
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
## 2021-04-25 ENCOUNTER — Encounter: Payer: Medicare Other | Admitting: Physical Therapy

## 2021-04-26 ENCOUNTER — Other Ambulatory Visit: Payer: Self-pay

## 2021-04-26 ENCOUNTER — Ambulatory Visit: Payer: Medicare Other | Admitting: Physical Therapy

## 2021-04-26 DIAGNOSIS — R2689 Other abnormalities of gait and mobility: Secondary | ICD-10-CM | POA: Diagnosis not present

## 2021-04-26 DIAGNOSIS — M545 Low back pain, unspecified: Secondary | ICD-10-CM

## 2021-04-26 DIAGNOSIS — M6281 Muscle weakness (generalized): Secondary | ICD-10-CM

## 2021-04-26 DIAGNOSIS — R2681 Unsteadiness on feet: Secondary | ICD-10-CM

## 2021-04-26 DIAGNOSIS — R262 Difficulty in walking, not elsewhere classified: Secondary | ICD-10-CM

## 2021-04-26 DIAGNOSIS — G8929 Other chronic pain: Secondary | ICD-10-CM
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
## 2021-04-28 ENCOUNTER — Other Ambulatory Visit: Payer: Self-pay

## 2021-04-28 ENCOUNTER — Ambulatory Visit: Payer: Medicare Other | Admitting: Physical Therapy

## 2021-04-28 DIAGNOSIS — R2681 Unsteadiness on feet: Secondary | ICD-10-CM

## 2021-04-28 DIAGNOSIS — M6281 Muscle weakness (generalized): Secondary | ICD-10-CM

## 2021-04-28 DIAGNOSIS — G8929 Other chronic pain: Secondary | ICD-10-CM

## 2021-04-28 DIAGNOSIS — M545 Low back pain, unspecified: Secondary | ICD-10-CM

## 2021-04-28 DIAGNOSIS — R2689 Other abnormalities of gait and mobility: Secondary | ICD-10-CM

## 2021-04-28 DIAGNOSIS — R262 Difficulty in walking, not elsewhere classified: Secondary | ICD-10-CM
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
## 2021-05-03 ENCOUNTER — Other Ambulatory Visit: Payer: Self-pay

## 2021-05-03 ENCOUNTER — Ambulatory Visit: Payer: Medicare Other | Admitting: Physical Therapy

## 2021-05-03 DIAGNOSIS — R2689 Other abnormalities of gait and mobility: Secondary | ICD-10-CM | POA: Diagnosis not present

## 2021-05-03 DIAGNOSIS — R2681 Unsteadiness on feet: Secondary | ICD-10-CM

## 2021-05-03 DIAGNOSIS — G8929 Other chronic pain: Secondary | ICD-10-CM

## 2021-05-03 DIAGNOSIS — M545 Low back pain, unspecified: Secondary | ICD-10-CM

## 2021-05-03 DIAGNOSIS — R262 Difficulty in walking, not elsewhere classified: Secondary | ICD-10-CM

## 2021-05-03 DIAGNOSIS — M6281 Muscle weakness (generalized): Secondary | ICD-10-CM
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
## 2021-05-05 ENCOUNTER — Other Ambulatory Visit: Payer: Self-pay

## 2021-05-05 ENCOUNTER — Ambulatory Visit: Payer: Medicare Other | Admitting: Physical Therapy

## 2021-05-05 DIAGNOSIS — G8929 Other chronic pain: Secondary | ICD-10-CM

## 2021-05-05 DIAGNOSIS — M545 Low back pain, unspecified: Secondary | ICD-10-CM

## 2021-05-05 DIAGNOSIS — R2689 Other abnormalities of gait and mobility: Secondary | ICD-10-CM | POA: Diagnosis not present

## 2021-05-05 DIAGNOSIS — M6281 Muscle weakness (generalized): Secondary | ICD-10-CM

## 2021-05-05 DIAGNOSIS — R2681 Unsteadiness on feet: Secondary | ICD-10-CM

## 2021-05-05 DIAGNOSIS — R262 Difficulty in walking, not elsewhere classified: Secondary | ICD-10-CM
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
## 2021-05-09 ENCOUNTER — Encounter: Payer: Self-pay | Admitting: Physical Therapy

## 2021-05-09 ENCOUNTER — Other Ambulatory Visit: Payer: Self-pay

## 2021-05-09 ENCOUNTER — Ambulatory Visit: Payer: Medicare Other | Admitting: Physical Therapy

## 2021-05-09 DIAGNOSIS — M545 Low back pain, unspecified: Secondary | ICD-10-CM

## 2021-05-09 DIAGNOSIS — R29898 Other symptoms and signs involving the musculoskeletal system: Secondary | ICD-10-CM

## 2021-05-09 DIAGNOSIS — R2689 Other abnormalities of gait and mobility: Secondary | ICD-10-CM | POA: Diagnosis not present

## 2021-05-09 DIAGNOSIS — R262 Difficulty in walking, not elsewhere classified: Secondary | ICD-10-CM

## 2021-05-09 DIAGNOSIS — G8929 Other chronic pain: Secondary | ICD-10-CM

## 2021-05-09 DIAGNOSIS — M6281 Muscle weakness (generalized): Secondary | ICD-10-CM

## 2021-05-09 DIAGNOSIS — R2681 Unsteadiness on feet: Secondary | ICD-10-CM
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
## 2021-05-11 ENCOUNTER — Ambulatory Visit: Payer: Medicare Other | Admitting: Physical Therapy

## 2021-05-11 ENCOUNTER — Encounter: Payer: Self-pay | Admitting: Physical Therapy

## 2021-05-11 ENCOUNTER — Other Ambulatory Visit: Payer: Self-pay

## 2021-05-11 DIAGNOSIS — R2681 Unsteadiness on feet: Secondary | ICD-10-CM

## 2021-05-11 DIAGNOSIS — M545 Low back pain, unspecified: Secondary | ICD-10-CM

## 2021-05-11 DIAGNOSIS — G8929 Other chronic pain: Secondary | ICD-10-CM

## 2021-05-11 DIAGNOSIS — R262 Difficulty in walking, not elsewhere classified: Secondary | ICD-10-CM

## 2021-05-11 DIAGNOSIS — R29898 Other symptoms and signs involving the musculoskeletal system: Secondary | ICD-10-CM

## 2021-05-11 DIAGNOSIS — M6281 Muscle weakness (generalized): Secondary | ICD-10-CM

## 2021-05-11 DIAGNOSIS — R2689 Other abnormalities of gait and mobility: Secondary | ICD-10-CM | POA: Diagnosis not present
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
## 2021-05-17 ENCOUNTER — Ambulatory Visit: Payer: Medicare Other | Admitting: Physical Therapy

## 2021-05-17 ENCOUNTER — Other Ambulatory Visit: Payer: Self-pay

## 2021-05-17 DIAGNOSIS — M545 Low back pain, unspecified: Secondary | ICD-10-CM

## 2021-05-17 DIAGNOSIS — G8929 Other chronic pain: Secondary | ICD-10-CM

## 2021-05-17 DIAGNOSIS — M6281 Muscle weakness (generalized): Secondary | ICD-10-CM

## 2021-05-17 DIAGNOSIS — R2681 Unsteadiness on feet: Secondary | ICD-10-CM

## 2021-05-17 DIAGNOSIS — R2689 Other abnormalities of gait and mobility: Secondary | ICD-10-CM

## 2021-05-17 DIAGNOSIS — R262 Difficulty in walking, not elsewhere classified: Secondary | ICD-10-CM
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
## 2021-05-23 ENCOUNTER — Encounter: Payer: Self-pay | Admitting: Physical Therapy

## 2021-05-23 ENCOUNTER — Other Ambulatory Visit: Payer: Self-pay

## 2021-05-23 ENCOUNTER — Ambulatory Visit: Payer: Medicare Other | Attending: Neurological Surgery | Admitting: Physical Therapy

## 2021-05-23 DIAGNOSIS — M6281 Muscle weakness (generalized): Secondary | ICD-10-CM | POA: Diagnosis present

## 2021-05-23 DIAGNOSIS — R2681 Unsteadiness on feet: Secondary | ICD-10-CM

## 2021-05-23 DIAGNOSIS — M545 Low back pain, unspecified: Secondary | ICD-10-CM | POA: Diagnosis present

## 2021-05-23 DIAGNOSIS — R2689 Other abnormalities of gait and mobility: Secondary | ICD-10-CM

## 2021-05-23 DIAGNOSIS — R262 Difficulty in walking, not elsewhere classified: Secondary | ICD-10-CM

## 2021-05-23 DIAGNOSIS — G8929 Other chronic pain: Secondary | ICD-10-CM
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
## 2021-05-30 ENCOUNTER — Other Ambulatory Visit: Payer: Self-pay

## 2021-05-30 ENCOUNTER — Encounter: Payer: Self-pay | Admitting: Physical Therapy

## 2021-05-30 ENCOUNTER — Ambulatory Visit: Payer: Medicare Other | Admitting: Physical Therapy

## 2021-05-30 DIAGNOSIS — M6281 Muscle weakness (generalized): Secondary | ICD-10-CM

## 2021-05-30 DIAGNOSIS — M545 Low back pain, unspecified: Secondary | ICD-10-CM

## 2021-05-30 DIAGNOSIS — R262 Difficulty in walking, not elsewhere classified: Secondary | ICD-10-CM

## 2021-05-30 DIAGNOSIS — R2681 Unsteadiness on feet: Secondary | ICD-10-CM

## 2021-05-30 DIAGNOSIS — G8929 Other chronic pain: Secondary | ICD-10-CM

## 2021-05-30 DIAGNOSIS — R2689 Other abnormalities of gait and mobility: Secondary | ICD-10-CM
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
## 2021-06-07 ENCOUNTER — Ambulatory Visit: Payer: Medicare Other | Admitting: Physical Therapy

## 2021-06-07 ENCOUNTER — Other Ambulatory Visit: Payer: Self-pay

## 2021-06-07 DIAGNOSIS — R2689 Other abnormalities of gait and mobility: Secondary | ICD-10-CM

## 2021-06-07 DIAGNOSIS — M6281 Muscle weakness (generalized): Secondary | ICD-10-CM

## 2021-06-07 DIAGNOSIS — G8929 Other chronic pain: Secondary | ICD-10-CM

## 2021-06-07 DIAGNOSIS — R262 Difficulty in walking, not elsewhere classified: Secondary | ICD-10-CM

## 2021-06-07 DIAGNOSIS — R2681 Unsteadiness on feet: Secondary | ICD-10-CM
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
## 2021-06-13 ENCOUNTER — Encounter: Payer: Medicare Other | Admitting: Physical Therapy

## 2021-06-14 ENCOUNTER — Encounter: Payer: Self-pay | Admitting: Physical Therapy

## 2021-06-21 ENCOUNTER — Encounter: Payer: Medicare Other | Admitting: Physical Therapy

## 2021-06-22 ENCOUNTER — Ambulatory Visit (HOSPITAL_BASED_OUTPATIENT_CLINIC_OR_DEPARTMENT_OTHER): Payer: Medicare Other | Attending: Neurological Surgery | Admitting: Physical Therapy

## 2021-06-22 ENCOUNTER — Ambulatory Visit (HOSPITAL_BASED_OUTPATIENT_CLINIC_OR_DEPARTMENT_OTHER): Payer: Medicare Other | Admitting: Physical Therapy

## 2021-06-22 ENCOUNTER — Other Ambulatory Visit: Payer: Self-pay

## 2021-06-22 ENCOUNTER — Encounter (HOSPITAL_BASED_OUTPATIENT_CLINIC_OR_DEPARTMENT_OTHER): Payer: Self-pay | Admitting: Physical Therapy

## 2021-06-22 DIAGNOSIS — R2681 Unsteadiness on feet: Secondary | ICD-10-CM | POA: Insufficient documentation

## 2021-06-22 DIAGNOSIS — M6281 Muscle weakness (generalized): Secondary | ICD-10-CM | POA: Diagnosis present

## 2021-06-22 DIAGNOSIS — R262 Difficulty in walking, not elsewhere classified: Secondary | ICD-10-CM | POA: Diagnosis present

## 2021-06-22 NOTE — Patient Instructions (Signed)
Access Code: [email or web address] URL: [email or web address] Date: 06/22/2021 Prepared by: Daleen Bo  Exercises Supine Posterior Pelvic Tilt - 2 x daily - 7 x weekly - 2 sets - 10 reps - 2 hold Supine Bridge - 2 x daily - 7 x weekly - 2 sets - 10 reps
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
## 2021-06-23 ENCOUNTER — Ambulatory Visit (HOSPITAL_BASED_OUTPATIENT_CLINIC_OR_DEPARTMENT_OTHER): Payer: Medicare Other | Admitting: Physical Therapy

## 2021-06-27 ENCOUNTER — Ambulatory Visit (HOSPITAL_BASED_OUTPATIENT_CLINIC_OR_DEPARTMENT_OTHER): Payer: Medicare Other | Admitting: Physical Therapy

## 2021-06-27 ENCOUNTER — Other Ambulatory Visit: Payer: Self-pay

## 2021-06-27 ENCOUNTER — Encounter (HOSPITAL_BASED_OUTPATIENT_CLINIC_OR_DEPARTMENT_OTHER): Payer: Self-pay | Admitting: Physical Therapy

## 2021-06-27 DIAGNOSIS — M6281 Muscle weakness (generalized): Secondary | ICD-10-CM

## 2021-06-27 DIAGNOSIS — R262 Difficulty in walking, not elsewhere classified: Secondary | ICD-10-CM

## 2021-06-27 DIAGNOSIS — R2681 Unsteadiness on feet: Secondary | ICD-10-CM | POA: Diagnosis not present
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
## 2021-07-04 ENCOUNTER — Ambulatory Visit (HOSPITAL_BASED_OUTPATIENT_CLINIC_OR_DEPARTMENT_OTHER): Payer: Medicare Other | Admitting: Physical Therapy

## 2021-07-04 ENCOUNTER — Other Ambulatory Visit: Payer: Self-pay

## 2021-07-04 DIAGNOSIS — R2681 Unsteadiness on feet: Secondary | ICD-10-CM

## 2021-07-04 DIAGNOSIS — R262 Difficulty in walking, not elsewhere classified: Secondary | ICD-10-CM

## 2021-07-04 DIAGNOSIS — M6281 Muscle weakness (generalized): Secondary | ICD-10-CM
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
## 2021-07-11 ENCOUNTER — Encounter (HOSPITAL_BASED_OUTPATIENT_CLINIC_OR_DEPARTMENT_OTHER): Payer: Self-pay | Admitting: Physical Therapy

## 2021-07-11 ENCOUNTER — Ambulatory Visit (HOSPITAL_BASED_OUTPATIENT_CLINIC_OR_DEPARTMENT_OTHER): Payer: Medicare Other | Admitting: Physical Therapy

## 2021-07-11 ENCOUNTER — Other Ambulatory Visit: Payer: Self-pay

## 2021-07-11 DIAGNOSIS — R2681 Unsteadiness on feet: Secondary | ICD-10-CM | POA: Diagnosis not present

## 2021-07-11 DIAGNOSIS — R262 Difficulty in walking, not elsewhere classified: Secondary | ICD-10-CM

## 2021-07-11 DIAGNOSIS — M6281 Muscle weakness (generalized): Secondary | ICD-10-CM
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
## 2021-07-18 ENCOUNTER — Other Ambulatory Visit: Payer: Self-pay

## 2021-07-18 ENCOUNTER — Ambulatory Visit (HOSPITAL_BASED_OUTPATIENT_CLINIC_OR_DEPARTMENT_OTHER): Payer: Medicare Other | Attending: Neurological Surgery | Admitting: Physical Therapy

## 2021-07-18 ENCOUNTER — Encounter (HOSPITAL_BASED_OUTPATIENT_CLINIC_OR_DEPARTMENT_OTHER): Payer: Self-pay | Admitting: Physical Therapy

## 2021-07-18 DIAGNOSIS — R262 Difficulty in walking, not elsewhere classified: Secondary | ICD-10-CM | POA: Diagnosis present

## 2021-07-18 DIAGNOSIS — R2681 Unsteadiness on feet: Secondary | ICD-10-CM | POA: Insufficient documentation

## 2021-07-18 DIAGNOSIS — M6281 Muscle weakness (generalized): Secondary | ICD-10-CM | POA: Insufficient documentation

## 2021-07-18 DIAGNOSIS — R2689 Other abnormalities of gait and mobility: Secondary | ICD-10-CM | POA: Insufficient documentation
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
## 2021-07-18 NOTE — Patient Instructions (Signed)
Access Code: [email or web address] URL: [email or web address] Date: 07/18/2021 Prepared by: Daleen Bo  Exercises Supine Posterior Pelvic Tilt - 2 x daily - 7 x weekly - 2 sets - 10 reps - 2 hold Supine Bridge - 2 x daily - 7 x weekly - 2 sets - 10 reps Standing March with Counter Support - 2 x daily - 7 x weekly - 2 sets - 10 reps

## 2021-07-25 ENCOUNTER — Other Ambulatory Visit: Payer: Self-pay

## 2021-07-25 ENCOUNTER — Ambulatory Visit (HOSPITAL_BASED_OUTPATIENT_CLINIC_OR_DEPARTMENT_OTHER): Payer: Medicare Other | Admitting: Physical Therapy

## 2021-07-25 DIAGNOSIS — R262 Difficulty in walking, not elsewhere classified: Secondary | ICD-10-CM

## 2021-07-25 DIAGNOSIS — R2681 Unsteadiness on feet: Secondary | ICD-10-CM | POA: Diagnosis not present

## 2021-07-25 DIAGNOSIS — R2689 Other abnormalities of gait and mobility: Secondary | ICD-10-CM

## 2021-07-25 DIAGNOSIS — M6281 Muscle weakness (generalized): Secondary | ICD-10-CM
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
## 2021-07-27 IMAGING — CR DG LUMBAR SPINE 2-3V
2 series · 2 of 2 positions shown · non-contrast
Comparison: None.

CLINICAL DATA: Intraoperative images for localization

EXAM:
LUMBAR SPINE - 2-3 VIEW

[lateral (1 of 2)]
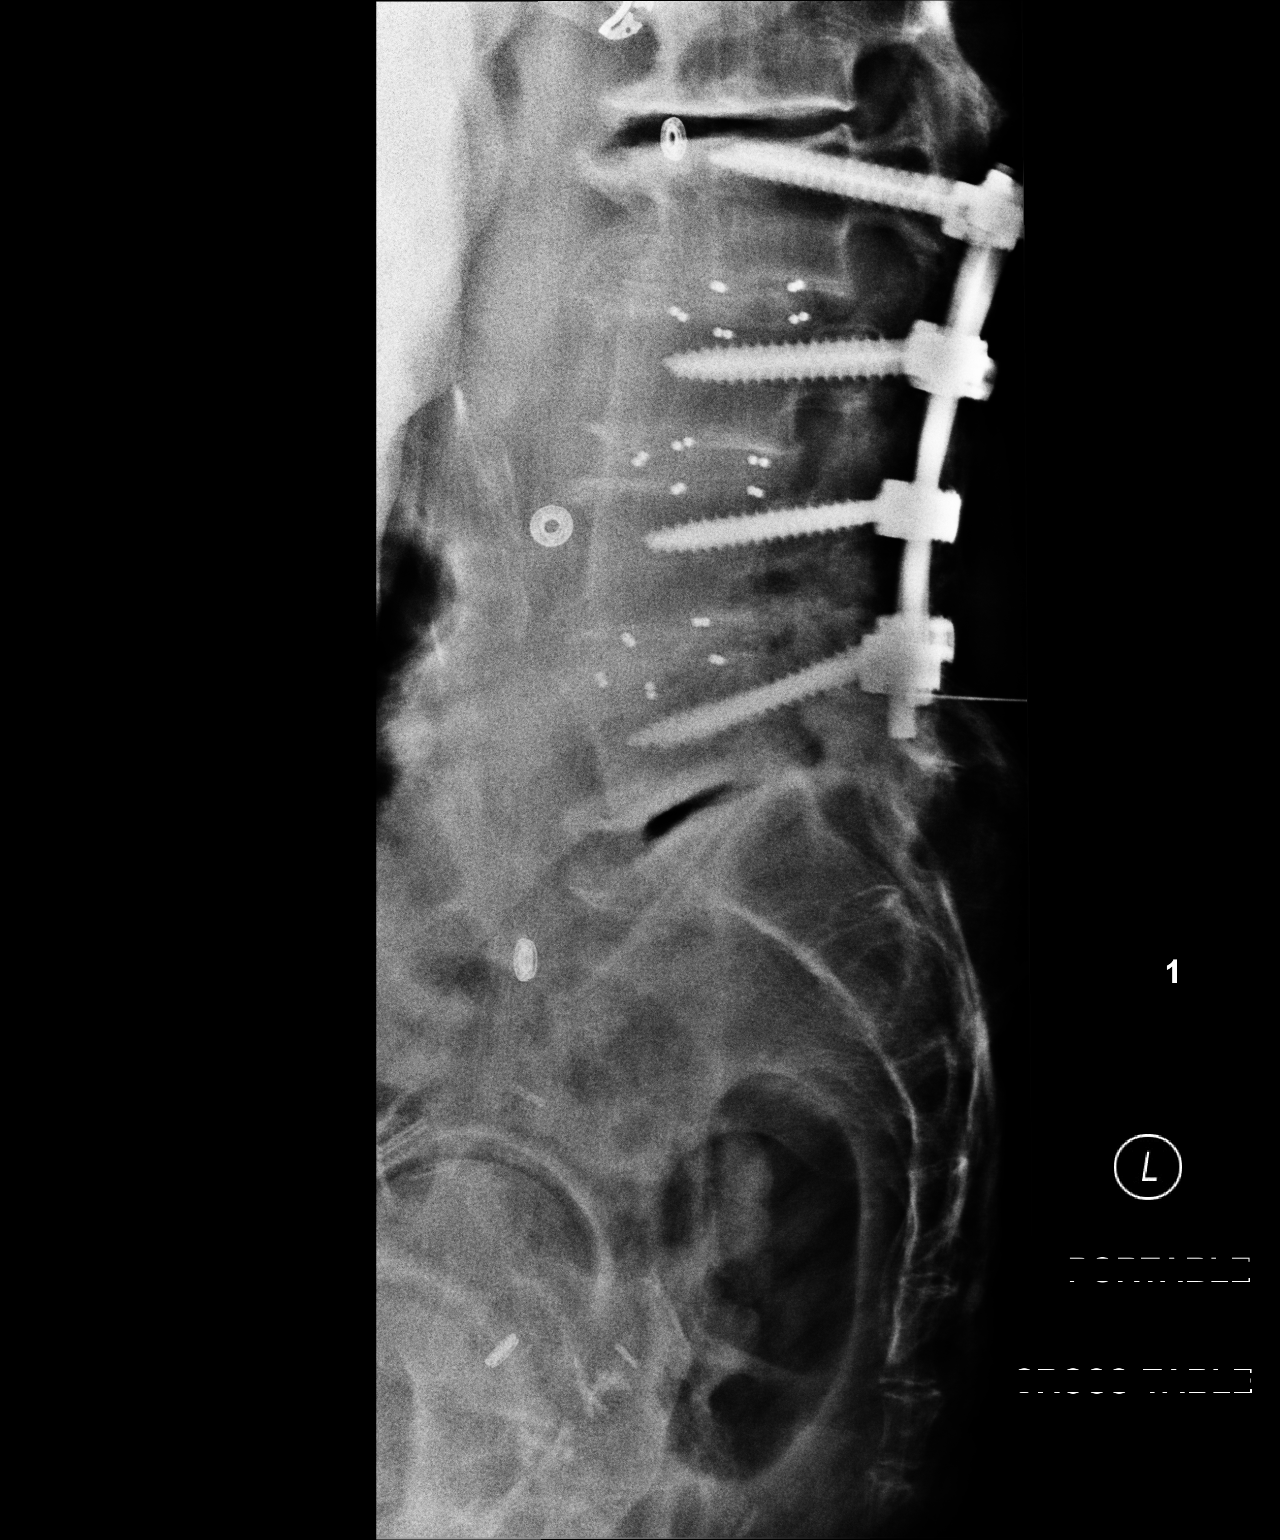

[lateral (2 of 2)]
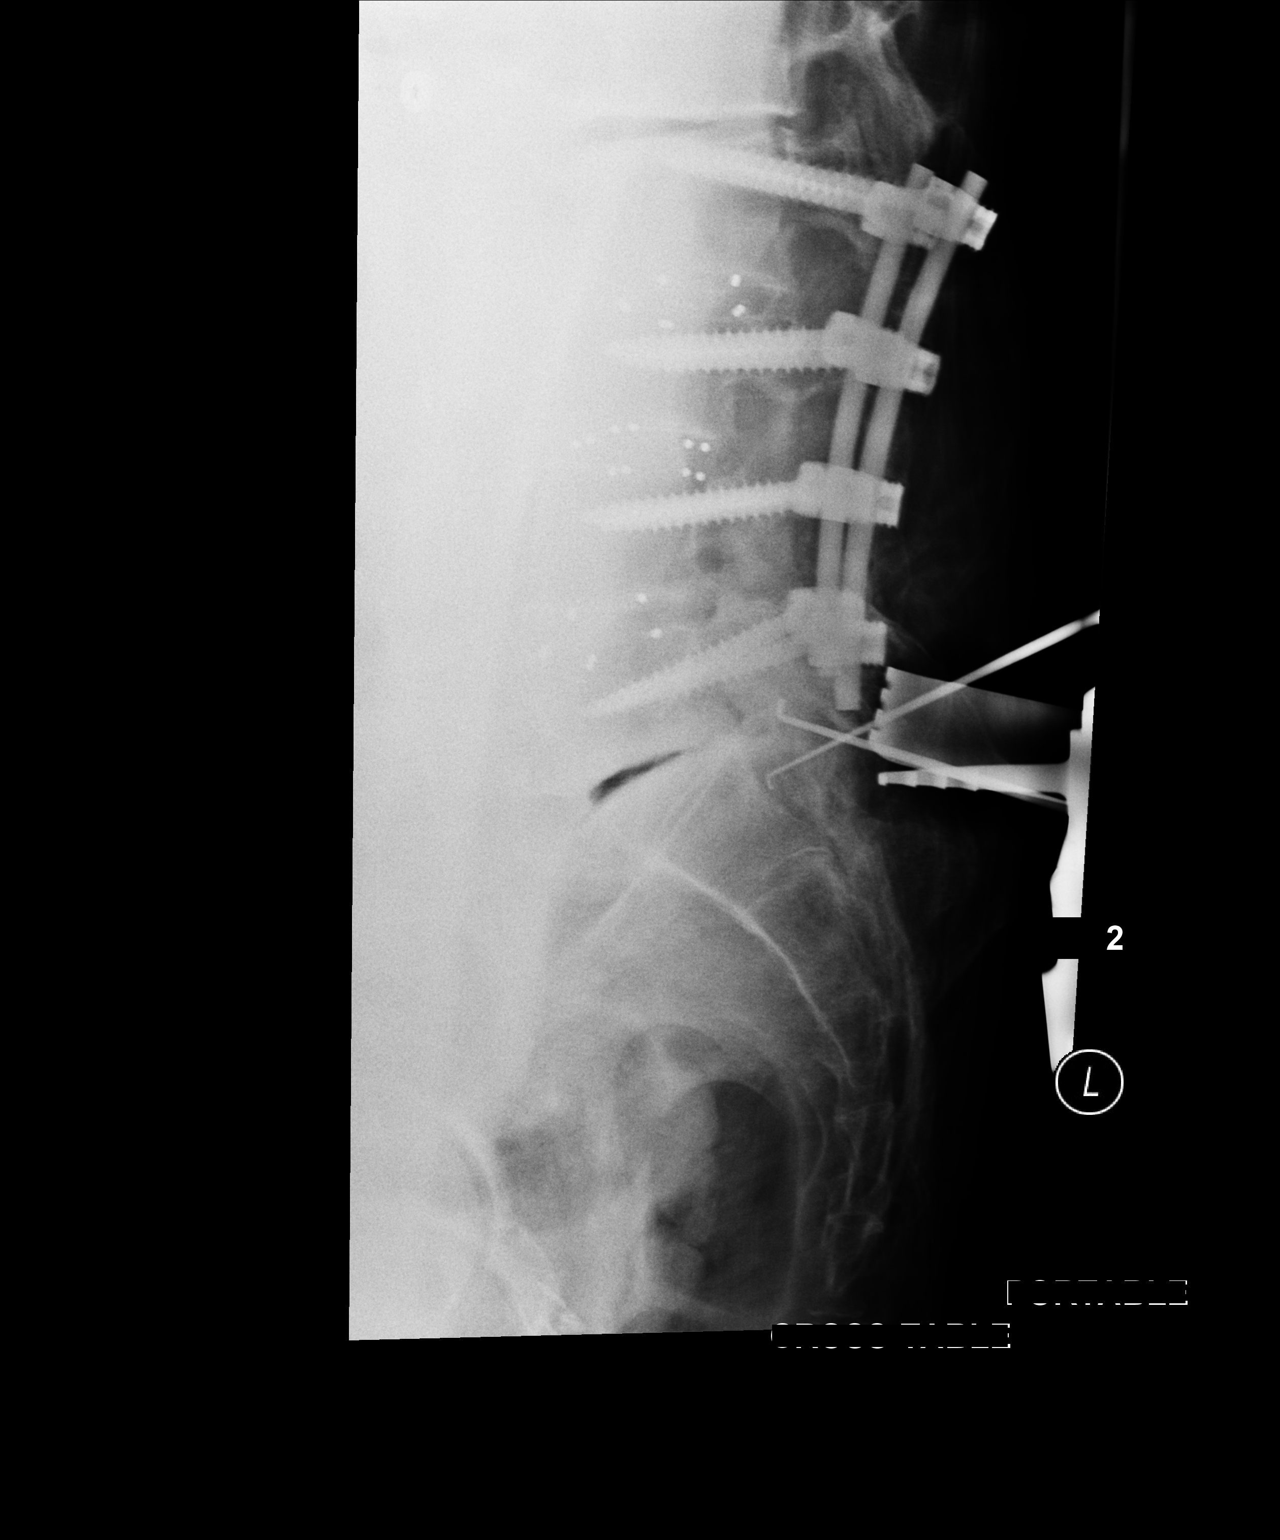

[2 of 2 positions shown; findings below may reference images not displayed]

FINDINGS: Two images are submitted. On the first lateral view, there is a
localizer dorsal to the L5 vertebral body. On the second lateral
image there are localizers dorsal to the L5-S1 disc space and S1
vertebral body.
IMPRESSION: Intraoperative localization.

## 2021-07-29 ENCOUNTER — Other Ambulatory Visit: Payer: Self-pay | Admitting: *Deleted

## 2021-07-29 DIAGNOSIS — I872 Venous insufficiency (chronic) (peripheral): Secondary | ICD-10-CM

## 2021-08-03 ENCOUNTER — Encounter (HOSPITAL_BASED_OUTPATIENT_CLINIC_OR_DEPARTMENT_OTHER): Payer: Self-pay | Admitting: Physical Therapy

## 2021-08-03 ENCOUNTER — Ambulatory Visit (HOSPITAL_BASED_OUTPATIENT_CLINIC_OR_DEPARTMENT_OTHER): Payer: Medicare Other | Admitting: Physical Therapy

## 2021-08-03 ENCOUNTER — Other Ambulatory Visit: Payer: Self-pay

## 2021-08-03 DIAGNOSIS — R262 Difficulty in walking, not elsewhere classified: Secondary | ICD-10-CM

## 2021-08-03 DIAGNOSIS — R2681 Unsteadiness on feet: Secondary | ICD-10-CM

## 2021-08-03 DIAGNOSIS — M6281 Muscle weakness (generalized): Secondary | ICD-10-CM
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
## 2021-08-04 ENCOUNTER — Encounter (HOSPITAL_BASED_OUTPATIENT_CLINIC_OR_DEPARTMENT_OTHER): Payer: Self-pay | Admitting: Physical Therapy

## 2021-08-04 ENCOUNTER — Ambulatory Visit (HOSPITAL_BASED_OUTPATIENT_CLINIC_OR_DEPARTMENT_OTHER): Payer: Medicare Other | Admitting: Physical Therapy

## 2021-08-04 DIAGNOSIS — M6281 Muscle weakness (generalized): Secondary | ICD-10-CM

## 2021-08-04 DIAGNOSIS — R2681 Unsteadiness on feet: Secondary | ICD-10-CM | POA: Diagnosis not present

## 2021-08-04 DIAGNOSIS — R262 Difficulty in walking, not elsewhere classified: Secondary | ICD-10-CM
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
## 2021-08-08 ENCOUNTER — Ambulatory Visit (HOSPITAL_BASED_OUTPATIENT_CLINIC_OR_DEPARTMENT_OTHER): Payer: Medicare Other | Admitting: Physical Therapy

## 2021-08-08 ENCOUNTER — Encounter (HOSPITAL_BASED_OUTPATIENT_CLINIC_OR_DEPARTMENT_OTHER): Payer: Self-pay | Admitting: Physical Therapy

## 2021-08-08 ENCOUNTER — Other Ambulatory Visit: Payer: Self-pay

## 2021-08-08 DIAGNOSIS — R2689 Other abnormalities of gait and mobility: Secondary | ICD-10-CM

## 2021-08-08 DIAGNOSIS — R2681 Unsteadiness on feet: Secondary | ICD-10-CM

## 2021-08-08 DIAGNOSIS — M6281 Muscle weakness (generalized): Secondary | ICD-10-CM

## 2021-08-08 DIAGNOSIS — R262 Difficulty in walking, not elsewhere classified: Secondary | ICD-10-CM
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
## 2021-08-11 ENCOUNTER — Other Ambulatory Visit: Payer: Self-pay

## 2021-08-11 ENCOUNTER — Encounter (HOSPITAL_BASED_OUTPATIENT_CLINIC_OR_DEPARTMENT_OTHER): Payer: Self-pay | Admitting: Physical Therapy

## 2021-08-11 ENCOUNTER — Ambulatory Visit (HOSPITAL_BASED_OUTPATIENT_CLINIC_OR_DEPARTMENT_OTHER): Payer: Medicare Other | Admitting: Physical Therapy

## 2021-08-11 DIAGNOSIS — M6281 Muscle weakness (generalized): Secondary | ICD-10-CM

## 2021-08-11 DIAGNOSIS — R2681 Unsteadiness on feet: Secondary | ICD-10-CM

## 2021-08-11 DIAGNOSIS — R262 Difficulty in walking, not elsewhere classified: Secondary | ICD-10-CM
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
## 2021-08-12 ENCOUNTER — Encounter: Payer: Self-pay | Admitting: Vascular Surgery

## 2021-08-12 ENCOUNTER — Ambulatory Visit (HOSPITAL_COMMUNITY)
Admission: RE | Admit: 2021-08-12 | Discharge: 2021-08-12 | Disposition: A | Discharge status: Home / Self Care | Payer: Medicare Other | Source: Ambulatory Visit | Attending: Vascular Surgery | Admitting: Vascular Surgery

## 2021-08-12 ENCOUNTER — Ambulatory Visit (INDEPENDENT_AMBULATORY_CARE_PROVIDER_SITE_OTHER): Payer: Medicare Other | Admitting: Vascular Surgery

## 2021-08-12 VITALS — BP 125/75 | HR 87 | Temp 97.7°F | Resp 18 | Ht 74.0 in | Wt 250.0 lb

## 2021-08-12 DIAGNOSIS — M7989 Other specified soft tissue disorders: Secondary | ICD-10-CM | POA: Diagnosis not present

## 2021-08-12 DIAGNOSIS — I872 Venous insufficiency (chronic) (peripheral): Secondary | ICD-10-CM
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
## 2021-08-15 ENCOUNTER — Ambulatory Visit (HOSPITAL_BASED_OUTPATIENT_CLINIC_OR_DEPARTMENT_OTHER): Payer: Medicare Other | Admitting: Physical Therapy

## 2021-08-15 ENCOUNTER — Encounter (HOSPITAL_BASED_OUTPATIENT_CLINIC_OR_DEPARTMENT_OTHER): Payer: Self-pay | Admitting: Physical Therapy

## 2021-08-15 ENCOUNTER — Other Ambulatory Visit: Payer: Self-pay

## 2021-08-15 DIAGNOSIS — M6281 Muscle weakness (generalized): Secondary | ICD-10-CM

## 2021-08-15 DIAGNOSIS — R2681 Unsteadiness on feet: Secondary | ICD-10-CM | POA: Diagnosis not present

## 2021-08-15 DIAGNOSIS — R262 Difficulty in walking, not elsewhere classified: Secondary | ICD-10-CM
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
## 2021-08-18 ENCOUNTER — Ambulatory Visit (HOSPITAL_BASED_OUTPATIENT_CLINIC_OR_DEPARTMENT_OTHER): Payer: Medicare Other | Admitting: Physical Therapy

## 2021-08-25 ENCOUNTER — Ambulatory Visit (HOSPITAL_BASED_OUTPATIENT_CLINIC_OR_DEPARTMENT_OTHER): Payer: Medicare Other | Admitting: Physical Therapy

## 2021-08-29 ENCOUNTER — Ambulatory Visit (HOSPITAL_BASED_OUTPATIENT_CLINIC_OR_DEPARTMENT_OTHER): Payer: Medicare Other | Attending: Neurological Surgery | Admitting: Physical Therapy

## 2021-08-29 ENCOUNTER — Encounter (HOSPITAL_BASED_OUTPATIENT_CLINIC_OR_DEPARTMENT_OTHER): Payer: Self-pay | Admitting: Physical Therapy

## 2021-08-29 ENCOUNTER — Other Ambulatory Visit: Payer: Self-pay

## 2021-08-29 DIAGNOSIS — R262 Difficulty in walking, not elsewhere classified: Secondary | ICD-10-CM | POA: Insufficient documentation

## 2021-08-29 DIAGNOSIS — R2689 Other abnormalities of gait and mobility: Secondary | ICD-10-CM | POA: Diagnosis present

## 2021-08-29 DIAGNOSIS — M6281 Muscle weakness (generalized): Secondary | ICD-10-CM | POA: Insufficient documentation

## 2021-08-29 DIAGNOSIS — R2681 Unsteadiness on feet: Secondary | ICD-10-CM | POA: Insufficient documentation
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
## 2021-08-31 ENCOUNTER — Encounter (HOSPITAL_BASED_OUTPATIENT_CLINIC_OR_DEPARTMENT_OTHER): Payer: Self-pay | Admitting: Physical Therapy

## 2021-08-31 ENCOUNTER — Other Ambulatory Visit: Payer: Self-pay

## 2021-08-31 ENCOUNTER — Ambulatory Visit (HOSPITAL_BASED_OUTPATIENT_CLINIC_OR_DEPARTMENT_OTHER): Payer: Medicare Other | Admitting: Physical Therapy

## 2021-08-31 DIAGNOSIS — M6281 Muscle weakness (generalized): Secondary | ICD-10-CM

## 2021-08-31 DIAGNOSIS — R2681 Unsteadiness on feet: Secondary | ICD-10-CM | POA: Diagnosis not present

## 2021-08-31 DIAGNOSIS — R2689 Other abnormalities of gait and mobility: Secondary | ICD-10-CM

## 2021-08-31 DIAGNOSIS — R262 Difficulty in walking, not elsewhere classified: Secondary | ICD-10-CM
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
## 2021-08-31 NOTE — Patient Instructions (Signed)
Access Code: [email or web address] URL: [email or web address] Date: 08/31/2021 Prepared by: Daleen Bo  Exercises Supine Bridge - 1 x daily - 7 x weekly - 2 sets - 10 reps Standing March with Counter Support - 1 x daily - 7 x weekly - 2 sets - 10 reps Sit to Stand with Armchair - 1 x daily - 7 x weekly - 3 sets - 5 reps Side Stepping with Counter Support - 1 x daily - 7 x weekly - 1 sets - 3 reps - 95f hold

## 2021-09-06 ENCOUNTER — Encounter (HOSPITAL_BASED_OUTPATIENT_CLINIC_OR_DEPARTMENT_OTHER): Payer: Self-pay | Admitting: Physical Therapy

## 2021-09-06 ENCOUNTER — Ambulatory Visit (HOSPITAL_BASED_OUTPATIENT_CLINIC_OR_DEPARTMENT_OTHER): Payer: Medicare Other | Admitting: Physical Therapy

## 2021-09-06 ENCOUNTER — Other Ambulatory Visit: Payer: Self-pay

## 2021-09-06 DIAGNOSIS — R2681 Unsteadiness on feet: Secondary | ICD-10-CM | POA: Diagnosis not present

## 2021-09-06 DIAGNOSIS — M6281 Muscle weakness (generalized): Secondary | ICD-10-CM

## 2021-09-06 DIAGNOSIS — R262 Difficulty in walking, not elsewhere classified: Secondary | ICD-10-CM
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
## 2021-09-14 ENCOUNTER — Ambulatory Visit (HOSPITAL_BASED_OUTPATIENT_CLINIC_OR_DEPARTMENT_OTHER): Payer: Medicare Other | Admitting: Physical Therapy

## 2021-09-14 ENCOUNTER — Other Ambulatory Visit (HOSPITAL_BASED_OUTPATIENT_CLINIC_OR_DEPARTMENT_OTHER): Payer: Self-pay

## 2021-09-14 ENCOUNTER — Encounter (HOSPITAL_BASED_OUTPATIENT_CLINIC_OR_DEPARTMENT_OTHER): Payer: Self-pay | Admitting: Physical Therapy

## 2021-09-14 ENCOUNTER — Ambulatory Visit: Payer: Medicare Other | Attending: Internal Medicine

## 2021-09-14 ENCOUNTER — Other Ambulatory Visit: Payer: Self-pay

## 2021-09-14 DIAGNOSIS — R2689 Other abnormalities of gait and mobility: Secondary | ICD-10-CM

## 2021-09-14 DIAGNOSIS — R2681 Unsteadiness on feet: Secondary | ICD-10-CM | POA: Diagnosis not present

## 2021-09-14 DIAGNOSIS — M6281 Muscle weakness (generalized): Secondary | ICD-10-CM

## 2021-09-14 DIAGNOSIS — Z23 Encounter for immunization: Secondary | ICD-10-CM

## 2021-09-14 DIAGNOSIS — R262 Difficulty in walking, not elsewhere classified: Secondary | ICD-10-CM

## 2021-09-14 MED ORDER — PFIZER COVID-19 VAC BIVALENT 30 MCG/0.3ML IM SUSP
INTRAMUSCULAR | 0 refills | Status: DC
  Filled 2021-09-14: qty 0.3, 1d supply, fill #0

## 2021-09-14 NOTE — Progress Notes (Signed)
   Covid-19 Vaccination Clinic  Name:  [name]    MRN: [id number] DOB: [birth year]  09/14/2021  [name] was observed post Covid-19 immunization for 15 minutes without incident. He was provided with Vaccine Information Sheet and instruction to access the V-Safe system.   [name] was instructed to call 911 with any severe reactions post vaccine: Difficulty breathing  Swelling of face and throat  A fast heartbeat  A bad rash all over body  Dizziness and weakness
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
## 2021-09-16 ENCOUNTER — Ambulatory Visit (HOSPITAL_BASED_OUTPATIENT_CLINIC_OR_DEPARTMENT_OTHER): Payer: Medicare Other | Admitting: Physical Therapy

## 2021-09-16 ENCOUNTER — Encounter (HOSPITAL_BASED_OUTPATIENT_CLINIC_OR_DEPARTMENT_OTHER): Payer: Self-pay | Admitting: Physical Therapy

## 2021-09-16 ENCOUNTER — Other Ambulatory Visit: Payer: Self-pay

## 2021-09-16 DIAGNOSIS — R262 Difficulty in walking, not elsewhere classified: Secondary | ICD-10-CM

## 2021-09-16 DIAGNOSIS — M6281 Muscle weakness (generalized): Secondary | ICD-10-CM

## 2021-09-16 DIAGNOSIS — R2681 Unsteadiness on feet: Secondary | ICD-10-CM

## 2021-09-16 DIAGNOSIS — R2689 Other abnormalities of gait and mobility: Secondary | ICD-10-CM
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
## 2021-09-19 ENCOUNTER — Ambulatory Visit (HOSPITAL_BASED_OUTPATIENT_CLINIC_OR_DEPARTMENT_OTHER): Payer: Medicare Other | Attending: Neurological Surgery | Admitting: Physical Therapy

## 2021-09-19 ENCOUNTER — Other Ambulatory Visit: Payer: Self-pay

## 2021-09-19 ENCOUNTER — Encounter (HOSPITAL_BASED_OUTPATIENT_CLINIC_OR_DEPARTMENT_OTHER): Payer: Self-pay | Admitting: Physical Therapy

## 2021-09-19 DIAGNOSIS — R2689 Other abnormalities of gait and mobility: Secondary | ICD-10-CM | POA: Diagnosis present

## 2021-09-19 DIAGNOSIS — M6281 Muscle weakness (generalized): Secondary | ICD-10-CM | POA: Insufficient documentation

## 2021-09-19 DIAGNOSIS — R262 Difficulty in walking, not elsewhere classified: Secondary | ICD-10-CM | POA: Diagnosis present

## 2021-09-19 DIAGNOSIS — R2681 Unsteadiness on feet: Secondary | ICD-10-CM | POA: Diagnosis not present
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
## 2021-09-21 ENCOUNTER — Other Ambulatory Visit: Payer: Self-pay

## 2021-09-21 ENCOUNTER — Ambulatory Visit (HOSPITAL_BASED_OUTPATIENT_CLINIC_OR_DEPARTMENT_OTHER): Payer: Medicare Other | Admitting: Physical Therapy

## 2021-09-21 ENCOUNTER — Encounter (HOSPITAL_BASED_OUTPATIENT_CLINIC_OR_DEPARTMENT_OTHER): Payer: Self-pay | Admitting: Physical Therapy

## 2021-09-21 DIAGNOSIS — R2689 Other abnormalities of gait and mobility: Secondary | ICD-10-CM

## 2021-09-21 DIAGNOSIS — R2681 Unsteadiness on feet: Secondary | ICD-10-CM | POA: Diagnosis not present

## 2021-09-21 DIAGNOSIS — R262 Difficulty in walking, not elsewhere classified: Secondary | ICD-10-CM

## 2021-09-21 DIAGNOSIS — M6281 Muscle weakness (generalized): Secondary | ICD-10-CM
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
## 2021-09-26 ENCOUNTER — Ambulatory Visit (HOSPITAL_BASED_OUTPATIENT_CLINIC_OR_DEPARTMENT_OTHER): Payer: Medicare Other | Admitting: Physical Therapy

## 2021-09-26 ENCOUNTER — Other Ambulatory Visit: Payer: Self-pay

## 2021-09-26 ENCOUNTER — Encounter (HOSPITAL_BASED_OUTPATIENT_CLINIC_OR_DEPARTMENT_OTHER): Payer: Self-pay | Admitting: Physical Therapy

## 2021-09-26 DIAGNOSIS — R2681 Unsteadiness on feet: Secondary | ICD-10-CM | POA: Diagnosis not present

## 2021-09-26 DIAGNOSIS — R262 Difficulty in walking, not elsewhere classified: Secondary | ICD-10-CM

## 2021-09-26 DIAGNOSIS — R2689 Other abnormalities of gait and mobility: Secondary | ICD-10-CM

## 2021-09-26 DIAGNOSIS — M6281 Muscle weakness (generalized): Secondary | ICD-10-CM
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
## 2021-09-29 ENCOUNTER — Ambulatory Visit (HOSPITAL_BASED_OUTPATIENT_CLINIC_OR_DEPARTMENT_OTHER): Payer: Medicare Other | Admitting: Physical Therapy

## 2021-10-04 ENCOUNTER — Ambulatory Visit (HOSPITAL_BASED_OUTPATIENT_CLINIC_OR_DEPARTMENT_OTHER): Payer: Medicare Other | Admitting: Physical Therapy

## 2021-10-06 ENCOUNTER — Ambulatory Visit (HOSPITAL_BASED_OUTPATIENT_CLINIC_OR_DEPARTMENT_OTHER): Payer: Medicare Other | Admitting: Physical Therapy

## 2021-10-10 ENCOUNTER — Ambulatory Visit (HOSPITAL_BASED_OUTPATIENT_CLINIC_OR_DEPARTMENT_OTHER): Payer: Medicare Other | Admitting: Physical Therapy

## 2021-10-13 ENCOUNTER — Ambulatory Visit (HOSPITAL_BASED_OUTPATIENT_CLINIC_OR_DEPARTMENT_OTHER): Payer: Medicare Other | Admitting: Physical Therapy

## 2021-12-27 ENCOUNTER — Ambulatory Visit: Payer: Medicare Other | Attending: Neurological Surgery | Admitting: Physical Therapy

## 2021-12-27 ENCOUNTER — Other Ambulatory Visit: Payer: Self-pay

## 2021-12-27 DIAGNOSIS — M5416 Radiculopathy, lumbar region: Secondary | ICD-10-CM | POA: Diagnosis present

## 2021-12-27 DIAGNOSIS — M545 Low back pain, unspecified: Secondary | ICD-10-CM | POA: Diagnosis not present

## 2021-12-27 DIAGNOSIS — M6281 Muscle weakness (generalized): Secondary | ICD-10-CM | POA: Diagnosis not present

## 2021-12-27 DIAGNOSIS — G8929 Other chronic pain: Secondary | ICD-10-CM | POA: Diagnosis not present

## 2021-12-27 DIAGNOSIS — R2681 Unsteadiness on feet: Secondary | ICD-10-CM | POA: Diagnosis not present

## 2021-12-27 DIAGNOSIS — R262 Difficulty in walking, not elsewhere classified: Secondary | ICD-10-CM | POA: Diagnosis not present
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
## 2021-12-28 ENCOUNTER — Ambulatory Visit: Payer: Medicare Other | Admitting: Physical Therapy

## 2021-12-28 ENCOUNTER — Encounter: Payer: Self-pay | Admitting: Physical Therapy

## 2021-12-28 DIAGNOSIS — R2681 Unsteadiness on feet: Secondary | ICD-10-CM

## 2021-12-28 DIAGNOSIS — R262 Difficulty in walking, not elsewhere classified: Secondary | ICD-10-CM

## 2021-12-28 DIAGNOSIS — M6281 Muscle weakness (generalized): Secondary | ICD-10-CM

## 2021-12-28 DIAGNOSIS — M545 Low back pain, unspecified: Secondary | ICD-10-CM
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
## 2022-01-03 ENCOUNTER — Ambulatory Visit: Payer: Medicare Other | Admitting: Physical Therapy

## 2022-01-03 ENCOUNTER — Other Ambulatory Visit: Payer: Self-pay

## 2022-01-03 DIAGNOSIS — M6281 Muscle weakness (generalized): Secondary | ICD-10-CM

## 2022-01-03 DIAGNOSIS — R262 Difficulty in walking, not elsewhere classified: Secondary | ICD-10-CM
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
## 2022-01-06 ENCOUNTER — Encounter: Payer: Self-pay | Admitting: Physical Therapy

## 2022-01-06 ENCOUNTER — Other Ambulatory Visit: Payer: Self-pay

## 2022-01-06 ENCOUNTER — Ambulatory Visit: Payer: Medicare Other | Admitting: Physical Therapy

## 2022-01-06 DIAGNOSIS — R262 Difficulty in walking, not elsewhere classified: Secondary | ICD-10-CM | POA: Diagnosis not present

## 2022-01-06 DIAGNOSIS — M6281 Muscle weakness (generalized): Secondary | ICD-10-CM
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
## 2022-01-12 ENCOUNTER — Encounter: Payer: Medicare Other | Admitting: Physical Therapy

## 2022-01-13 ENCOUNTER — Encounter: Payer: Medicare Other | Admitting: Physical Therapy

## 2022-01-16 ENCOUNTER — Encounter: Payer: Medicare Other | Admitting: Physical Therapy

## 2022-01-19 ENCOUNTER — Inpatient Hospital Stay (HOSPITAL_COMMUNITY)
Admission: AD | Admit: 2022-01-19 | Discharge: 2022-01-25 | DRG: 522 | Disposition: A | Discharge status: Skilled Nursing Facility | Payer: Medicare Other | Source: Ambulatory Visit | Attending: Internal Medicine | Admitting: Internal Medicine

## 2022-01-19 ENCOUNTER — Inpatient Hospital Stay (HOSPITAL_COMMUNITY): Payer: Medicare Other

## 2022-01-19 ENCOUNTER — Encounter: Payer: Medicare Other | Admitting: Physical Therapy

## 2022-01-19 DIAGNOSIS — E7800 Pure hypercholesterolemia, unspecified: Secondary | ICD-10-CM | POA: Diagnosis present

## 2022-01-19 DIAGNOSIS — Z20822 Contact with and (suspected) exposure to covid-19: Secondary | ICD-10-CM | POA: Diagnosis present

## 2022-01-19 DIAGNOSIS — R400 Somnolence: Secondary | ICD-10-CM | POA: Diagnosis not present

## 2022-01-19 DIAGNOSIS — Z8249 Family history of ischemic heart disease and other diseases of the circulatory system: Secondary | ICD-10-CM | POA: Diagnosis not present

## 2022-01-19 DIAGNOSIS — Z87891 Personal history of nicotine dependence: Secondary | ICD-10-CM

## 2022-01-19 DIAGNOSIS — R600 Localized edema: Secondary | ICD-10-CM | POA: Diagnosis not present

## 2022-01-19 DIAGNOSIS — Z96612 Presence of left artificial shoulder joint: Secondary | ICD-10-CM | POA: Diagnosis present

## 2022-01-19 DIAGNOSIS — T148XXA Other injury of unspecified body region, initial encounter: Secondary | ICD-10-CM

## 2022-01-19 DIAGNOSIS — R262 Difficulty in walking, not elsewhere classified: Secondary | ICD-10-CM | POA: Diagnosis present

## 2022-01-19 DIAGNOSIS — X58XXXA Exposure to other specified factors, initial encounter: Secondary | ICD-10-CM | POA: Diagnosis present

## 2022-01-19 DIAGNOSIS — R7989 Other specified abnormal findings of blood chemistry: Secondary | ICD-10-CM | POA: Diagnosis present

## 2022-01-19 DIAGNOSIS — Z8546 Personal history of malignant neoplasm of prostate: Secondary | ICD-10-CM

## 2022-01-19 DIAGNOSIS — Y929 Unspecified place or not applicable: Secondary | ICD-10-CM

## 2022-01-19 DIAGNOSIS — I872 Venous insufficiency (chronic) (peripheral): Secondary | ICD-10-CM | POA: Diagnosis present

## 2022-01-19 DIAGNOSIS — S72002A Fracture of unspecified part of neck of left femur, initial encounter for closed fracture: Secondary | ICD-10-CM | POA: Diagnosis present

## 2022-01-19 DIAGNOSIS — Z66 Do not resuscitate: Secondary | ICD-10-CM | POA: Diagnosis present

## 2022-01-19 DIAGNOSIS — Z79899 Other long term (current) drug therapy: Secondary | ICD-10-CM | POA: Diagnosis not present

## 2022-01-19 DIAGNOSIS — M5417 Radiculopathy, lumbosacral region: Secondary | ICD-10-CM | POA: Diagnosis present

## 2022-01-19 DIAGNOSIS — Z791 Long term (current) use of non-steroidal anti-inflammatories (NSAID): Secondary | ICD-10-CM

## 2022-01-19 DIAGNOSIS — Z419 Encounter for procedure for purposes other than remedying health state, unspecified: Secondary | ICD-10-CM

## 2022-01-19 DIAGNOSIS — Z9889 Other specified postprocedural states: Secondary | ICD-10-CM

## 2022-01-19 DIAGNOSIS — S72012A Unspecified intracapsular fracture of left femur, initial encounter for closed fracture: Principal | ICD-10-CM | POA: Diagnosis present

## 2022-01-19 DIAGNOSIS — Z8551 Personal history of malignant neoplasm of bladder: Secondary | ICD-10-CM

## 2022-01-19 DIAGNOSIS — E785 Hyperlipidemia, unspecified: Secondary | ICD-10-CM | POA: Diagnosis present

## 2022-01-19 DIAGNOSIS — Z7982 Long term (current) use of aspirin: Secondary | ICD-10-CM | POA: Diagnosis not present

## 2022-01-19 DIAGNOSIS — I5030 Unspecified diastolic (congestive) heart failure: Secondary | ICD-10-CM | POA: Diagnosis not present

## 2022-01-19 DIAGNOSIS — D649 Anemia, unspecified: Secondary | ICD-10-CM | POA: Diagnosis not present

## 2022-01-19 DIAGNOSIS — M961 Postlaminectomy syndrome, not elsewhere classified: Secondary | ICD-10-CM | POA: Diagnosis present

## 2022-01-19 DIAGNOSIS — R609 Edema, unspecified: Secondary | ICD-10-CM | POA: Diagnosis not present

## 2022-01-19 DIAGNOSIS — Z01811 Encounter for preprocedural respiratory examination: Secondary | ICD-10-CM

## 2022-01-19 LAB — TYPE AND SCREEN
ABO/RH(D): A NEG
Antibody Screen: NEGATIVE

## 2022-01-19 LAB — CBC
HCT: 41.6 % (ref 39.0–52.0)
Hemoglobin: 13.5 g/dL (ref 13.0–17.0)
MCH: 30.9 pg (ref 26.0–34.0)
MCHC: 32.5 g/dL (ref 30.0–36.0)
MCV: 95.2 fL (ref 80.0–100.0)
Platelets: 209 10*3/uL (ref 150–400)
RBC: 4.37 MIL/uL (ref 4.22–5.81)
RDW: 14.2 % (ref 11.5–15.5)
WBC: 7.7 10*3/uL (ref 4.0–10.5)
nRBC: 0 % (ref 0.0–0.2)

## 2022-01-19 LAB — SURGICAL PCR SCREEN
MRSA, PCR: NEGATIVE
Staphylococcus aureus: NEGATIVE

## 2022-01-19 LAB — COMPREHENSIVE METABOLIC PANEL
ALT: 16 U/L (ref 0–44)
AST: 21 U/L (ref 15–41)
Albumin: 3.3 g/dL — ABNORMAL LOW (ref 3.5–5.0)
Alkaline Phosphatase: 97 U/L (ref 38–126)
Anion gap: 7 (ref 5–15)
BUN: 27 mg/dL — ABNORMAL HIGH (ref 8–23)
CO2: 28 mmol/L (ref 22–32)
Calcium: 9.3 mg/dL (ref 8.9–10.3)
Chloride: 104 mmol/L (ref 98–111)
Creatinine, Ser: 1.33 mg/dL — ABNORMAL HIGH (ref 0.61–1.24)
GFR, Estimated: 53 mL/min — ABNORMAL LOW (ref >60)
Glucose, Bld: 96 mg/dL (ref 70–99)
Potassium: 4.7 mmol/L (ref 3.5–5.1)
Sodium: 139 mmol/L (ref 135–145)
Total Bilirubin: 0.6 mg/dL (ref 0.3–1.2)
Total Protein: 6.5 g/dL (ref 6.5–8.1)

## 2022-01-19 LAB — PROTIME-INR
INR: 1.1 (ref 0.8–1.2)
Prothrombin Time: 14.1 seconds (ref 11.4–15.2)

## 2022-01-19 MED ORDER — HYDROCODONE-ACETAMINOPHEN 5-325 MG PO TABS
1.0000 | ORAL_TABLET | Freq: Four times a day (QID) | ORAL | Status: DC | PRN
  Filled 2022-01-19: qty 2

## 2022-01-19 MED ORDER — HYDROCODONE-ACETAMINOPHEN 5-325 MG PO TABS
1.0000 | ORAL_TABLET | Freq: Four times a day (QID) | ORAL | Status: DC | PRN
  Administered 2022-01-19 – 2022-01-20 (×2): 1 via ORAL
  Filled 2022-01-19: qty 1

## 2022-01-19 MED ORDER — ACETAMINOPHEN 325 MG PO TABS: 650.0000 mg | ORAL_TABLET | Freq: Four times a day (QID) | ORAL | Status: DC | PRN

## 2022-01-19 MED ORDER — MUPIROCIN 2 % EX OINT
1.0000 "application " | TOPICAL_OINTMENT | Freq: Two times a day (BID) | CUTANEOUS | Status: AC
  Administered 2022-01-20 – 2022-01-24 (×9): 1 via NASAL
  Filled 2022-01-19 (×4): qty 22

## 2022-01-19 MED ORDER — MORPHINE SULFATE (PF) 2 MG/ML IV SOLN: 0.5000 mg | INTRAVENOUS | Status: DC | PRN

## 2022-01-19 MED ORDER — DOCUSATE SODIUM 100 MG PO CAPS: 100.0000 mg | ORAL_CAPSULE | Freq: Two times a day (BID) | ORAL | Status: DC

## 2022-01-19 NOTE — Progress Notes (Signed)
Patient seen in clinic this afternoon with displaced left femoral neck fracture.  Inability to ambulate.  Direct admitted to the hospitalist for medical comanagement and preoperative optimization.  Patient is tentatively posted for left hip hemiarthroplasty at 10 AM on 01/20/2022 if medically optimized.
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
## 2022-01-19 NOTE — Progress Notes (Signed)
[facility]: [facility] Requesting Physician: Dr Charlies Constable, MD   History: 80 male who presented to the orthopedic office with hip pain and difficulty ambulating.  Found to have a left-sided femoral neck fracture.  Dr. Zachery Dakins requesting transfer for intervention.  Plan of Care:  -- Accepted to medical/surgical bed at [facility] will assume care on arrival to [facility] inpatient unit.    Please page [place] and Consults [phone] as soon as the patient arrives to the hospital.   Javeah Loeza [place] DO

## 2022-01-19 NOTE — Assessment & Plan Note (Addendum)
Patient was seen by orthopedics.  Underwent surgery on 01/20/2022.  Pain is well controlled.  Seen by PT and OT.  SNF is recommended.

## 2022-01-19 NOTE — Assessment & Plan Note (Addendum)
Patient has chronic lower extremity edema.  He has a history of lymphedema.  Lower extremity Doppler studies without DVT.   BNP was normal.  Echocardiogram shows normal systolic function. Grade 1 diastolic dysfunction is noted.  No significant valvular abnormalities noted. He was noted to have low-grade fever with elevated WBCs. Noted to be on cefadroxil per orthopedics.. WBC appears to be improving. Continue to monitor.  No obvious source of infection identified otherwise.  Concern was for cellulitis of his lower extremities but those changes appear to be chronic. Continue compression stockings and keep legs elevated as much as possible

## 2022-01-19 NOTE — Assessment & Plan Note (Addendum)
History of prostate cancer  Remote history of bladder and prostate cancer currently not on treatment. -Outpatient urology follow-up

## 2022-01-19 NOTE — Assessment & Plan Note (Addendum)
Continue statin. 

## 2022-01-20 ENCOUNTER — Inpatient Hospital Stay (HOSPITAL_COMMUNITY): Payer: Medicare Other

## 2022-01-20 ENCOUNTER — Other Ambulatory Visit: Payer: Self-pay

## 2022-01-20 ENCOUNTER — Inpatient Hospital Stay (HOSPITAL_COMMUNITY): Payer: Medicare Other | Admitting: Certified Registered Nurse Anesthetist

## 2022-01-20 ENCOUNTER — Encounter (HOSPITAL_COMMUNITY)
Admission: AD | Disposition: A | Discharge status: Skilled Nursing Facility | Payer: Self-pay | Source: Ambulatory Visit | Attending: Internal Medicine

## 2022-01-20 ENCOUNTER — Inpatient Hospital Stay: Admit: 2022-01-20 | Payer: Medicare Other | Admitting: Orthopedic Surgery

## 2022-01-20 DIAGNOSIS — R7989 Other specified abnormal findings of blood chemistry: Secondary | ICD-10-CM

## 2022-01-20 DIAGNOSIS — R609 Edema, unspecified: Secondary | ICD-10-CM

## 2022-01-20 DIAGNOSIS — R600 Localized edema: Secondary | ICD-10-CM

## 2022-01-20 HISTORY — PX: HIP ARTHROPLASTY: SHX981

## 2022-01-20 LAB — RESP PANEL BY RT-PCR (FLU A&B, COVID) ARPGX2
Influenza A by PCR: NEGATIVE
Influenza B by PCR: NEGATIVE
SARS Coronavirus 2 by RT PCR: NEGATIVE

## 2022-01-20 LAB — BRAIN NATRIURETIC PEPTIDE: B Natriuretic Peptide: 92.3 pg/mL (ref 0.0–100.0)

## 2022-01-20 SURGERY — HEMIARTHROPLASTY, HIP, DIRECT ANTERIOR APPROACH, FOR FRACTURE
Anesthesia: General | Site: Hip | Laterality: Left

## 2022-01-20 MED ORDER — ONDANSETRON HCL 4 MG/2ML IJ SOLN: 4.0000 mg | Freq: Once | INTRAMUSCULAR | Status: DC | PRN

## 2022-01-20 MED ORDER — FENTANYL CITRATE (PF) 250 MCG/5ML IJ SOLN
INTRAMUSCULAR | Status: DC | PRN
  Administered 2022-01-20: 75 ug via INTRAVENOUS

## 2022-01-20 MED ORDER — ONDANSETRON HCL 4 MG/2ML IJ SOLN
INTRAMUSCULAR | Status: DC | PRN
  Administered 2022-01-20: 4 mg via INTRAVENOUS

## 2022-01-20 MED ORDER — CEFADROXIL 500 MG PO CAPS
500.0000 mg | ORAL_CAPSULE | Freq: Two times a day (BID) | ORAL | Status: DC
  Administered 2022-01-21 – 2022-01-24 (×7): 500 mg via ORAL
  Filled 2022-01-20 (×8): qty 1

## 2022-01-20 MED ORDER — CEFAZOLIN SODIUM-DEXTROSE 2-4 GM/100ML-% IV SOLN
2.0000 g | Freq: Three times a day (TID) | INTRAVENOUS | Status: AC
  Administered 2022-01-20 (×2): 2 g via INTRAVENOUS
  Filled 2022-01-20 (×2): qty 100

## 2022-01-20 MED ORDER — ADULT MULTIVITAMIN W/MINERALS CH
1.0000 | ORAL_TABLET | Freq: Every day | ORAL | Status: DC
  Administered 2022-01-21 – 2022-01-24 (×4): 1 via ORAL
  Filled 2022-01-20 (×5): qty 1

## 2022-01-20 MED ORDER — PHENYLEPHRINE 40 MCG/ML (10ML) SYRINGE FOR IV PUSH (FOR BLOOD PRESSURE SUPPORT)
PREFILLED_SYRINGE | INTRAVENOUS | Status: DC | PRN
  Administered 2022-01-20: 40 ug via INTRAVENOUS
  Administered 2022-01-20: 120 ug via INTRAVENOUS
  Administered 2022-01-20: 80 ug via INTRAVENOUS

## 2022-01-20 MED ORDER — MORPHINE SULFATE (PF) 2 MG/ML IV SOLN: 1.0000 mg | INTRAVENOUS | Status: DC | PRN

## 2022-01-20 MED ORDER — CEFAZOLIN SODIUM-DEXTROSE 2-4 GM/100ML-% IV SOLN
2.0000 g | INTRAVENOUS | Status: AC
  Administered 2022-01-20: 2 g via INTRAVENOUS
  Filled 2022-01-20: qty 100

## 2022-01-20 MED ORDER — DEXAMETHASONE SODIUM PHOSPHATE 10 MG/ML IJ SOLN
INTRAMUSCULAR | Status: DC | PRN
Start: 2022-01-20 — End: 2022-01-20
  Administered 2022-01-20: 5 mg via INTRAVENOUS

## 2022-01-20 MED ORDER — BUPIVACAINE HCL (PF) 0.25 % IJ SOLN
INTRAMUSCULAR | Status: AC
  Filled 2022-01-20: qty 30

## 2022-01-20 MED ORDER — TRANEXAMIC ACID-NACL 1000-0.7 MG/100ML-% IV SOLN
1000.0000 mg | INTRAVENOUS | Status: AC
  Administered 2022-01-20: 1000 mg via INTRAVENOUS
  Filled 2022-01-20: qty 100

## 2022-01-20 MED ORDER — PROPOFOL 10 MG/ML IV BOLUS
INTRAVENOUS | Status: AC
  Filled 2022-01-20: qty 20

## 2022-01-20 MED ORDER — LIDOCAINE 2% (20 MG/ML) 5 ML SYRINGE
INTRAMUSCULAR | Status: AC
  Filled 2022-01-20: qty 5

## 2022-01-20 MED ORDER — CHLORHEXIDINE GLUCONATE 4 % EX LIQD
60.0000 mL | Freq: Once | CUTANEOUS | Status: AC
  Administered 2022-01-20: 4 via TOPICAL
  Filled 2022-01-20: qty 60

## 2022-01-20 MED ORDER — ROCURONIUM BROMIDE 10 MG/ML (PF) SYRINGE
PREFILLED_SYRINGE | INTRAVENOUS | Status: DC | PRN
Start: 2022-01-20 — End: 2022-01-20
  Administered 2022-01-20: 60 mg via INTRAVENOUS

## 2022-01-20 MED ORDER — FENTANYL CITRATE (PF) 250 MCG/5ML IJ SOLN
INTRAMUSCULAR | Status: AC
  Filled 2022-01-20: qty 5

## 2022-01-20 MED ORDER — FENTANYL CITRATE (PF) 100 MCG/2ML IJ SOLN
INTRAMUSCULAR | Status: AC
  Filled 2022-01-20: qty 2

## 2022-01-20 MED ORDER — SODIUM CHLORIDE 0.9 % IR SOLN
Status: DC | PRN
  Administered 2022-01-20: 500 mL

## 2022-01-20 MED ORDER — LIDOCAINE 2% (20 MG/ML) 5 ML SYRINGE
INTRAMUSCULAR | Status: DC | PRN
Start: 2022-01-20 — End: 2022-01-20
  Administered 2022-01-20: 80 mg via INTRAVENOUS

## 2022-01-20 MED ORDER — SUGAMMADEX SODIUM 200 MG/2ML IV SOLN
INTRAVENOUS | Status: DC | PRN
  Administered 2022-01-20 (×2): 100 mg via INTRAVENOUS

## 2022-01-20 MED ORDER — MEPERIDINE HCL 25 MG/ML IJ SOLN
INTRAMUSCULAR | Status: AC
  Filled 2022-01-20: qty 1

## 2022-01-20 MED ORDER — PHENYLEPHRINE HCL-NACL 20-0.9 MG/250ML-% IV SOLN
INTRAVENOUS | Status: DC | PRN
  Administered 2022-01-20: 30 ug/min via INTRAVENOUS

## 2022-01-20 MED ORDER — ROCURONIUM BROMIDE 10 MG/ML (PF) SYRINGE
PREFILLED_SYRINGE | INTRAVENOUS | Status: AC
  Filled 2022-01-20: qty 10

## 2022-01-20 MED ORDER — PROPOFOL 10 MG/ML IV BOLUS
INTRAVENOUS | Status: DC | PRN
  Administered 2022-01-20: 70 mg via INTRAVENOUS
  Administered 2022-01-20: 20 mg via INTRAVENOUS

## 2022-01-20 MED ORDER — DEXAMETHASONE SODIUM PHOSPHATE 10 MG/ML IJ SOLN
INTRAMUSCULAR | Status: AC
  Filled 2022-01-20: qty 1

## 2022-01-20 MED ORDER — ENOXAPARIN SODIUM 40 MG/0.4ML IJ SOSY
40.0000 mg | PREFILLED_SYRINGE | INTRAMUSCULAR | Status: DC
  Administered 2022-01-21 – 2022-01-24 (×4): 40 mg via SUBCUTANEOUS
  Filled 2022-01-20 (×5): qty 0.4

## 2022-01-20 MED ORDER — POVIDONE-IODINE 10 % EX SWAB: 2.0000 "application " | Freq: Once | CUTANEOUS | Status: DC

## 2022-01-20 MED ORDER — CHLORHEXIDINE GLUCONATE 0.12 % MT SOLN
OROMUCOSAL | Status: AC
  Administered 2022-01-20: 15 mL
  Filled 2022-01-20: qty 15

## 2022-01-20 MED ORDER — HYDROCODONE-ACETAMINOPHEN 5-325 MG PO TABS
1.0000 | ORAL_TABLET | ORAL | Status: DC | PRN
  Administered 2022-01-21 – 2022-01-23 (×5): 2 via ORAL
  Filled 2022-01-20 (×6): qty 2

## 2022-01-20 MED ORDER — FENTANYL CITRATE (PF) 100 MCG/2ML IJ SOLN
25.0000 ug | INTRAMUSCULAR | Status: DC | PRN
  Administered 2022-01-20 (×4): 25 ug via INTRAVENOUS

## 2022-01-20 MED ORDER — 0.9 % SODIUM CHLORIDE (POUR BTL) OPTIME
TOPICAL | Status: DC | PRN
  Administered 2022-01-20: 1000 mL

## 2022-01-20 MED ORDER — ENSURE ENLIVE PO LIQD
237.0000 mL | Freq: Three times a day (TID) | ORAL | Status: DC
  Administered 2022-01-21 – 2022-01-23 (×5): 237 mL via ORAL
  Filled 2022-01-20 (×3): qty 237

## 2022-01-20 MED ORDER — VANCOMYCIN HCL 1000 MG IV SOLR
INTRAVENOUS | Status: AC
  Filled 2022-01-20: qty 20

## 2022-01-20 MED ORDER — LACTATED RINGERS IV SOLN: INTRAVENOUS | Status: DC | PRN

## 2022-01-20 MED ORDER — ONDANSETRON HCL 4 MG/2ML IJ SOLN
INTRAMUSCULAR | Status: AC
  Filled 2022-01-20: qty 2

## 2022-01-20 MED ORDER — MEPERIDINE HCL 25 MG/ML IJ SOLN
6.2500 mg | INTRAMUSCULAR | Status: DC | PRN
  Administered 2022-01-20: 6.25 mg via INTRAVENOUS

## 2022-01-20 MED ORDER — PHENYLEPHRINE 40 MCG/ML (10ML) SYRINGE FOR IV PUSH (FOR BLOOD PRESSURE SUPPORT)
PREFILLED_SYRINGE | INTRAVENOUS | Status: AC
  Filled 2022-01-20: qty 10

## 2022-01-20 SURGICAL SUPPLY — 68 items
ADH SKN CLS APL DERMABOND .7 (GAUZE/BANDAGES/DRESSINGS) ×2
APL PRP STRL LF DISP 70% ISPRP (MISCELLANEOUS) ×2
BLADE SAW SAG HD 89.5X25X.94 (BLADE) ×1 IMPLANT
BRUSH FEMORAL CANAL (MISCELLANEOUS) IMPLANT
CEMENT BONE SIMPLEX SPEEDSET (Cement) IMPLANT
CHLORAPREP W/TINT 26 (MISCELLANEOUS) ×4 IMPLANT
COVER SURGICAL LIGHT HANDLE (MISCELLANEOUS) ×2 IMPLANT
DERMABOND ADVANCED (GAUZE/BANDAGES/DRESSINGS) ×2
DERMABOND ADVANCED .7 DNX12 (GAUZE/BANDAGES/DRESSINGS) ×2 IMPLANT
DRAPE HALF SHEET 40X57 (DRAPES) ×4 IMPLANT
DRAPE HIP W/POCKET STRL (MISCELLANEOUS) ×2 IMPLANT
DRAPE IMP U-DRAPE 54X76 (DRAPES) ×2 IMPLANT
DRAPE INCISE IOBAN 66X45 STRL (DRAPES) ×2 IMPLANT
DRAPE INCISE IOBAN 85X60 (DRAPES) ×2 IMPLANT
DRAPE POUCH INSTRU U-SHP 10X18 (DRAPES) ×2 IMPLANT
DRAPE SURG 17X11 SM STRL (DRAPES) ×2 IMPLANT
DRAPE U-SHAPE 47X51 STRL (DRAPES) ×2 IMPLANT
DRESSING AQUACEL AG SP 3.5X10 (GAUZE/BANDAGES/DRESSINGS) IMPLANT
DRSG AQUACEL AG ADV 3.5X 6 (GAUZE/BANDAGES/DRESSINGS) IMPLANT
DRSG AQUACEL AG ADV 3.5X10 (GAUZE/BANDAGES/DRESSINGS) ×1 IMPLANT
DRSG AQUACEL AG SP 3.5X10 (GAUZE/BANDAGES/DRESSINGS)
ELECT BLADE 4.0 EZ CLEAN MEGAD (MISCELLANEOUS) ×2
ELECT REM PT RETURN 15FT ADLT (MISCELLANEOUS) ×2 IMPLANT
ELECTRODE BLDE 4.0 EZ CLN MEGD (MISCELLANEOUS) ×1 IMPLANT
FEMORAL HEAD LFIT V40 28MM PL0 (Orthopedic Implant) ×1 IMPLANT
GLOVE SRG 8 PF TXTR STRL LF DI (GLOVE) ×1 IMPLANT
GLOVE SURG ORTHO LTX SZ8 (GLOVE) ×4 IMPLANT
GLOVE SURG UNDER POLY LF SZ8 (GLOVE) ×2
GOWN STRL REUS W/ TWL XL LVL3 (GOWN DISPOSABLE) ×1 IMPLANT
GOWN STRL REUS W/TWL XL LVL3 (GOWN DISPOSABLE) ×2
GUIDEROD T2 3X1000 (ROD) ×1 IMPLANT
HANDPIECE INTERPULSE COAX TIP (DISPOSABLE)
HEAD FEM BIPOLAR 28X55 (Head) ×1 IMPLANT
HOOD PEEL AWAY FLYTE STAYCOOL (MISCELLANEOUS) ×6 IMPLANT
IRRIGATION SURGIPHOR STRL (IV SOLUTION) IMPLANT
KIT BASIN OR (CUSTOM PROCEDURE TRAY) ×2 IMPLANT
KIT TURNOVER KIT A (KITS) ×2 IMPLANT
MANIFOLD NEPTUNE II (INSTRUMENTS) ×2 IMPLANT
MARKER SKIN DUAL TIP RULER LAB (MISCELLANEOUS) ×2 IMPLANT
NDL 18GX1X1/2 (RX/OR ONLY) (NEEDLE) ×1 IMPLANT
NEEDLE 18GX1X1/2 (RX/OR ONLY) (NEEDLE) ×2 IMPLANT
NS IRRIG 1000ML POUR BTL (IV SOLUTION) ×2 IMPLANT
PACK TOTAL JOINT (CUSTOM PROCEDURE TRAY) ×2 IMPLANT
REAMER SHAFT BIXCUT (INSTRUMENTS) ×1 IMPLANT
RETRIEVER SUT HEWSON (MISCELLANEOUS) ×2 IMPLANT
SEALER BIPOLAR AQUA 6.0 (INSTRUMENTS) ×2 IMPLANT
SET HNDPC FAN SPRY TIP SCT (DISPOSABLE) IMPLANT
SET INTERPULSE LAVAGE W/TIP (ORTHOPEDIC DISPOSABLE SUPPLIES) ×2 IMPLANT
SPONGE T-LAP 18X18 ~~LOC~~+RFID (SPONGE) ×4 IMPLANT
STAPLER VISISTAT 35W (STAPLE) ×2 IMPLANT
STEM ACCOLADE SZ 6 (Hips) ×1 IMPLANT
STOCKINETTE IMPERVIOUS LG (DRAPES) IMPLANT
SUCTION FRAZIER HANDLE 10FR (MISCELLANEOUS) ×2
SUCTION TUBE FRAZIER 10FR DISP (MISCELLANEOUS) ×1 IMPLANT
SUT ETHIBOND 2 V 37 (SUTURE) ×2 IMPLANT
SUT MNCRL AB 3-0 PS2 18 (SUTURE) ×2 IMPLANT
SUT VIC AB 0 CT1 27 (SUTURE) ×4
SUT VIC AB 0 CT1 27XBRD ANBCTR (SUTURE) ×2 IMPLANT
SUT VIC AB 1 CT1 27 (SUTURE) ×4
SUT VIC AB 1 CT1 27XBRD ANBCTR (SUTURE) ×2 IMPLANT
SUT VIC AB 2-0 CT2 27 (SUTURE) ×4 IMPLANT
SUT VLOC 180 0 24IN GS25 (SUTURE) ×2 IMPLANT
SYR 20ML LL LF (SYRINGE) ×2 IMPLANT
TOWEL GREEN STERILE (TOWEL DISPOSABLE) ×2 IMPLANT
TOWER CARTRIDGE SMART MIX (DISPOSABLE) IMPLANT
TRAY FOLEY MTR SLVR 16FR STAT (SET/KITS/TRAYS/PACK) ×2 IMPLANT
TUBE SUCT ARGYLE STRL (TUBING) ×2 IMPLANT
WATER STERILE IRR 1000ML POUR (IV SOLUTION) ×2 IMPLANT

## 2022-01-20 NOTE — Progress Notes (Signed)
Initial Nutrition Assessment  DOCUMENTATION CODES:   Not applicable  INTERVENTION:   - Ensure Enlive po TID, each supplement provides 350 kcal and 20 grams of protein.  - MVI with minerals  - Encourage PO intake  NUTRITION DIAGNOSIS:   Increased nutrient needs related to hip fracture, post-op healing as evidenced by estimated needs.  GOAL:   Patient will meet greater than or equal to 90% of their needs  MONITOR:   PO intake, Supplement acceptance, Labs, Weight trends  REASON FOR ASSESSMENT:   Consult Hip fracture protocol  ASSESSMENT:   86 year old male who presented on 2/02 with displaced L femoral neck fx. PMH of lumbosacral radiculopathy, bladder and prostate cancer, HLD, lymphedema.  Pt in OR for L hip hemiarthroplasty at time of RD attempted visit. Unable to obtain diet and weight history at this time.  Reviewed weight history in chart. Pt with an 11.3 kg weight loss since 08/12/21. This is a 10% weight loss in less than 6 months which is significant for timeframe. However, weight of 113.4 kg documented on 08/12/21 appears to have been coped over from previous encounters on 01/27/21 and 01/25/21.  Pt with good PO intake on Heart Healthy diet. RD to order oral nutrition supplements to aid pt in meeting increased needs related to hip fx and post-op healing. Will also order daily MVI with minerals.  Meal Completion: 75% x 1 Heart Healthy meal on 2/2  Medications reviewed and include: IV abx  Labs reviewed: BUN 27, creatinine 1.33  NUTRITION - FOCUSED PHYSICAL EXAM:  Unable to complete at this time. Pt in OR.  Diet Order:   Diet Order             Diet NPO time specified Except for: Sips with Meds  Diet effective midnight                   EDUCATION NEEDS:   No education needs have been identified at this time  Skin:  Skin Assessment: Reviewed RN Assessment  Last BM:  01/18/22  Height:   Ht Readings from Last 1 Encounters:  01/20/22 6\' 2"  (1.88 m)     Weight:   Wt Readings from Last 1 Encounters:  01/20/22 102.1 kg    BMI:  Body mass index is 28.89 kg/m.  Estimated Nutritional Needs:   Kcal:  2100-2300  Protein:  105-120 grams  Fluid:  >/= 2.0 L    Gustavus Bryant, MS, RD, LDN Inpatient Clinical Dietitian Please see [facility] for contact information.

## 2022-01-20 NOTE — Interval H&P Note (Signed)
The patient has been re-examined, and the chart reviewed, and there have been no interval changes to the documented history and physical.    The operative side was examined and the patient was confirmed to have. Sens DPN, SPN, TN intact, Motor EHL, ext, flex 5/5, severe lower extremity edema b/l lower extremities.   The risks, benefits, and alternatives have been discussed at length with patient, and the patient is willing to proceed.  Left hip marked. Consent has been signed.

## 2022-01-20 NOTE — Anesthesia Postprocedure Evaluation (Signed)
Anesthesia Post Note  Patient: [name]  Procedure(s) Performed: ARTHROPLASTY BIPOLAR HIP (HEMIARTHROPLASTY) (Left: Hip)     Patient location during evaluation: PACU Anesthesia Type: General Level of consciousness: awake and alert Pain management: pain level controlled Vital Signs Assessment: post-procedure vital signs reviewed and stable Respiratory status: spontaneous breathing, nonlabored ventilation, respiratory function stable and patient connected to nasal cannula oxygen Cardiovascular status: blood pressure returned to baseline and stable Postop Assessment: no apparent nausea or vomiting Anesthetic complications: no   No notable events documented.  Last Vitals:  Vitals:   01/20/22 1500 01/20/22 1541  BP: (!) 115/49 115/81  Pulse: 85 82  Resp: (!) 25 18  Temp: (!) 36.1 C 36.8 C  SpO2: 98% 97%    Last Pain:  Vitals:   01/20/22 1541  TempSrc: Oral                 Santa Lighter
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
## 2022-01-20 NOTE — Anesthesia Preprocedure Evaluation (Addendum)
Anesthesia Evaluation  Patient identified by MRN, date of birth, ID band Patient awake    Reviewed: Allergy & Precautions, NPO status , Patient's Chart, lab work & pertinent test results  Airway Mallampati: I  TM Distance: >3 FB Neck ROM: Full    Dental  (+) Dental Advisory Given, Partial Upper, Chipped   Pulmonary former smoker,    Pulmonary exam normal breath sounds clear to auscultation       Cardiovascular negative cardio ROS Normal cardiovascular exam Rhythm:Regular Rate:Normal     Neuro/Psych  Neuromuscular disease negative psych ROS   GI/Hepatic negative GI ROS, Neg liver ROS,   Endo/Other  negative endocrine ROS  Renal/GU negative Renal ROS     Musculoskeletal  (+) Arthritis ,  Left femoral neck fracture   Abdominal   Peds  Hematology negative hematology ROS (+)   Anesthesia Other Findings Day of surgery medications reviewed with the patient.  Reproductive/Obstetrics                            Anesthesia Physical Anesthesia Plan  ASA: 3  Anesthesia Plan: General   Post-op Pain Management: Tylenol PO (pre-op)   Induction: Intravenous  PONV Risk Score and Plan: 2 and Dexamethasone and Ondansetron  Airway Management Planned: Oral ETT  Additional Equipment:   Intra-op Plan:   Post-operative Plan: Extubation in OR  Informed Consent: I have reviewed the patients History and Physical, chart, labs and discussed the procedure including the risks, benefits and alternatives for the proposed anesthesia with the patient or authorized representative who has indicated his/her understanding and acceptance.   Patient has DNR.  Discussed DNR with patient and Suspend DNR.   Dental advisory given  Plan Discussed with: CRNA  Anesthesia Plan Comments:         Anesthesia Quick Evaluation

## 2022-01-20 NOTE — Assessment & Plan Note (Addendum)
Noted to have creatinine of 1.33 at admission.  Old labs reviewed.  Creatinine was 1.46 in February 2022.  Quite possible he has an element of chronic disease but unknown stage at this time.  Will need to be worked up in the outpatient setting.   Renal function fluctuated here in the hospital.  Given gentle hydration for a few hours.  Noted to be better today.  Avoid nephrotoxic agents.

## 2022-01-20 NOTE — Op Note (Signed)
01/19/2022 - 01/20/2022  12:19 PM  PATIENT:  [name]   MRN: [id number]  PRE-OPERATIVE DIAGNOSIS:  Left displaced femoral neck fracture.  POST-OPERATIVE DIAGNOSIS:  Left displaced femoral neck fracture.  PROCEDURE:  Procedure(s): ARTHROPLASTY BIPOLAR HIP (HEMIARTHROPLASTY)  PREOPERATIVE INDICATIONS:  [name] is an 86 y.o. male who was admitted 01/19/2022 with a diagnosis of Closed displaced fracture of left femoral neck ([place]) and elected for surgical management.  The risks benefits and alternatives were discussed with the patient including but not limited to the risks of nonoperative treatment, versus surgical intervention including infection, bleeding, nerve injury, periprosthetic fracture, the need for revision surgery, dislocation, leg length discrepancy, blood clots, cardiopulmonary complications, morbidity, mortality, among others, and they were willing to proceed.  Predicted outcome is good, although there will be at least a six to nine month expected recovery.   OPERATIVE REPORT     SURGEON:  Charlies Constable, MD    ASSISTANT:  Izola Price, RNFA,  (Present throughout the entire procedure,  necessary for completion of procedure in a timely manner, assisting with retraction, instrumentation, and closure)     ANESTHESIA:  General  ESTIMATED BLOOD LOSS: 937 cc    COMPLICATIONS:  None.       COMPONENTS:  [employer] Accolade two 127 degree size 6, bipolar Hemi head 28 mm cobalt chrome inner ball, 55 mm outer ball. Implant Name Type Inv. Item Serial No. Manufacturer Lot No. LRB No. Used Action  STEM ACCOLADE SZ 6 - [id number] Hips STEM ACCOLADE SZ 6  [employer] [id number] Left 1 Implanted  FEMORAL HEAD LFIT V40 28MM PL0 - [id number] Orthopedic Implant FEMORAL HEAD LFIT V40 28MM PL0  [employer] [id number] Left 1 Implanted  HEAD FEM BIPOLAR 28X55 - [id number] Head HEAD FEM BIPOLAR 28X55  [employer] [id number] Left 1 Implanted      PROCEDURE IN DETAIL: The  patient was met in the holding area and identified.  The appropriate hip  was marked at the operative site. The patient was then transported to the OR and  placed under anesthesia.  At that point, the patient was  placed in the lateral decubitus position with the operative side up and  secured to the operating room table and all bony prominences padded. A subaxillary role was placed.    The operative lower extremity was prepped from the iliac crest to the ankle.  Sterile draping was performed.  2g of ancef and 1g TXA were given prior to incision. Time out was performed prior to incision.      A routine posterolateral approach was utilized via sharp dissection  carried down to the subcutaneous tissue.  Gross bleeders were Bovie  coagulated.  The iliotibial band was identified and incised  along the length of the skin incision.  A Charnley retractor was inserted with care to protect the sciatic nerve.  With the hip internally rotated, the short external rotators  were identified. The piriformis was tagged with #5 Ethibond, and the hip capsule released in a T-type fashion, and posterior sleeve of the capsule was also tagged.  The femoral neck was exposed, and I resected the femoral neck using the appropriate jig. This was performed at approximately a thumb's breadth above the lesser trochanter.    I then exposed the deep acetabulum, cleared out any tissue including the ligamentum teres.    I then prepared the proximal femur using the box cutter, Charnley awl, and then sequentially broached.  A trial utilized, and I reduced the  hip, leg lengths were assessed clinically and felt to be equal. The hip was then taken through a full range of motion, the hip was stable at full extension and 90 degrees external rotation without anterior subluxation. The hip was also stable in the position of sleep, and in neutral abduction up to 90 degrees flexion, and 90 degrees IR. The trial components were then removed.   The  patient had good mediolateral fit and excellent rotational stability and good bone quality so elected to use a press-fit stem.  The real stem was opened and impacted into place.  The hip was then reduced with the trial head again and taken through functional range of motion and found to have excellent stability. Leg lengths were restored. The real head was then impacted onto the stem and the hip was again reduced.  The capsule was then repaired with #2 Ethibond., and the piriformis was repaired to the abductor tendon.  His. Excellent posterior capsular repair was achieved.   I then irrigated the hip copiously again with pulse lavage. The fascia and IT band was repaired with #1 stratafix, followed by 0 vicryl for the fat layer followed by 2-0 Vicryl and running 3-0 Monocryl for the skin, Dermabond was applied and an Aquacel dressing was placed.  The patient was then awakened and returned to PACU in stable and satisfactory condition. There were no complications.  Post op recs: WB: WBAT with posterior hip precautions x6 weeks Abx: ancef while on house or POD3 and then cefadroxil 500 BID x7 days Imaging: PACU xrays Dressing: Aquacel dressing to be kept intact until follow-up DVT prophylaxis: lovenox starting POD1 x4 weeks Follow up: 2 weeks after surgery for a wound check with Dr. Zachery Dakins at [facility].  Address: [address] [place], [place], [place] [address]  Office Phone: [phone]   Charlies Constable, MD Orthopedic Surgeon  01/20/2022 12:19 PM

## 2022-01-20 NOTE — Hospital Course (Addendum)
86 y.o. male with medical history significant of lumbosacral radiculopathy, remote history of bladder and prostate cancer followed by urology at [facility], hyperlipidemia, lymphedema presented to [facility] orthopedics office with left hip pain and difficulty ambulating.  Imaging revealed displaced left femoral neck fracture.  He was hospitalized for further management.  Orthopedics was consulted.  Underwent surgery on 01/20/2022.  Stable for the most part.

## 2022-01-20 NOTE — Progress Notes (Signed)
[facility] PROGRESS NOTE   [name] IWP:[id number] DOB: [birth year] DOA: 01/19/2022  1 DOS: the patient was seen and examined on 01/20/2022  PCP: Shon Baton, MD  Brief History and Hospital Course:  86 y.o. male with medical history significant of lumbosacral radiculopathy, remote history of bladder and prostate cancer followed by urology at [facility], hyperlipidemia, lymphedema presented to [facility] orthopedics office with left hip pain and difficulty ambulating.  Imaging revealed displaced left femoral neck fracture.  He was hospitalized for further management.  Orthopedics was consulted.  Consultants: Orthopedics  Procedures: Lower extremity Doppler studies    Subjective: Patient mentions that he feels well this morning.  Denies any shortness of breath or chest pain.  Denies any heart disease.  Mentions that his leg swelling has been present for more than a year and has not noticed any worsening recently.    Assessment/Plan:  * Closed displaced fracture of left femoral neck ([place])- (present on admission) Patient was seen by orthopedics.  Plan is for surgical intervention today.  EKG without any concerning findings.  Chest x-ray was unremarkable.  No significant murmurs appreciated on examination.  Bilateral lower extremity edema- (present on admission) Patient has history of lymphedema.  Patient does have significant edema bilateral lower extremities.  However patient mentions that his legs have been this way for about a year.  Has not noticed any worsening in his edema.  Denies any pain.  No fever or chills.  This is likely chronic COVID evidence for chronic venous disease and venous stasis dermatitis.  Will benefit from compression stockings.  Lower extremity Doppler studies were negative for DVT.  BNP was normal.  Echocardiogram is pending.    Elevated serum creatinine- (present on admission) Noted to have creatinine of 1.33 at admission.  Old labs reviewed.  Creatinine  was 1.46 in February 2022.  Quite possible he has an element of chronic disease but unknown stage at this time.  Will need to be worked up in the outpatient setting.  Monitor renal function closely in the hospital.  Avoid nephrotoxic agents.  Monitor urine output.  History of bladder cancer History of prostate cancer Remote history of bladder and prostate cancer currently not on treatment. -Outpatient urology follow-up  HLD (hyperlipidemia)- (present on admission) Supposed to be on statin at home which can be resumed after surgery.      DVT Prophylaxis: Initiate chemoprophylaxis after her surgery Code Status: DNR Family Communication: Discussed with patient.  No family at bedside Disposition Plan: To be determined  Status is: Inpatient Remains inpatient appropriate because: Left hip fracture.       Medications: Scheduled:  [MAR Hold] mupirocin ointment  1 application Nasal BID   povidone-iodine  2 application Topical Once   Continuous:   ceFAZolin (ANCEF) IV     tranexamic acid     PRN:[MAR Hold] acetaminophen, [MAR Hold] HYDROcodone-acetaminophen  Antibiotics: Anti-infectives (From admission, onward)    Start     Dose/Rate Route Frequency Ordered Stop   01/20/22 0900  ceFAZolin (ANCEF) IVPB 2g/100 mL premix        2 g 200 mL/hr over 30 Minutes Intravenous On call to O.R. 01/20/22 0409 01/21/22 0559       Objective:  Vital Signs  Vitals:   01/19/22 2111 01/20/22 0031 01/20/22 0726 01/20/22 0939  BP: 115/67 119/64 122/69 137/71  Pulse: 63 62 73 68  Resp: 17 17 17 18   Temp: 97.9 F (36.6 C) 97.9 F (36.6 C) 98.4 F (  36.9 C)   TempSrc: Oral Oral Oral   SpO2: 98% 98% 98% 98%  Weight:    102.1 kg  Height:    6\' 2"  (1.88 m)    Intake/Output Summary (Last 24 hours) at 01/20/2022 1044 Last data filed at 01/20/2022 0445 Gross per 24 hour  Intake 360 ml  Output 1000 ml  Net -640 ml   Filed Weights   01/20/22 0939  Weight: 102.1 kg    General  appearance: Awake alert.  In no distress Resp: Clear to auscultation bilaterally.  Normal effort Cardio: S1-S2 is normal regular.  No S3-S4.  No rubs murmurs or bruit GI: Abdomen is soft.  Nontender nondistended.  Bowel sounds are present normal.  No masses organomegaly Extremities: 2+ pitting edema bilateral lower extremities with erythema anteriorly over both lower extremities Neurologic: Alert and oriented x3.  No focal neurological deficits.    Lab Results:  Data Reviewed: I have personally reviewed labs and imaging study reports  CBC: Recent Labs  Lab 01/19/22 1947  WBC 7.7  HGB 13.5  HCT 41.6  MCV 95.2  PLT 242    Basic Metabolic Panel: Recent Labs  Lab 01/19/22 1947  NA 139  K 4.7  CL 104  CO2 28  GLUCOSE 96  BUN 27*  CREATININE 1.33*  CALCIUM 9.3    GFR: Estimated Creatinine Clearance: 53.7 mL/min (A) (by C-G formula based on SCr of 1.33 mg/dL (H)).  Liver Function Tests: Recent Labs  Lab 01/19/22 1947  AST 21  ALT 16  ALKPHOS 97  BILITOT 0.6  PROT 6.5  ALBUMIN 3.3*     Coagulation Profile: Recent Labs  Lab 01/19/22 1947  INR 1.1     Recent Results (from the past 240 hour(s))  Surgical PCR screen     Status: None   Collection Time: 01/19/22  7:00 PM   Specimen: Nasal Mucosa; Nasal Swab  Result Value Ref Range Status   MRSA, PCR NEGATIVE NEGATIVE Final   Staphylococcus aureus NEGATIVE NEGATIVE Final    Comment: (NOTE) The Xpert SA Assay (FDA approved for NASAL specimens in patients 75 years of age and older), is one component of a comprehensive surveillance program. It is not intended to diagnose infection nor to guide or monitor treatment. Performed at [facility], [place] [address] [place], [place] [address]   Resp Panel by RT-PCR (Flu A&B, Covid) Nasopharyngeal Swab     Status: None   Collection Time: 01/20/22  7:30 AM   Specimen: Nasopharyngeal Swab; Nasopharyngeal(NP) swabs in vial transport medium  Result Value Ref Range  Status   SARS Coronavirus 2 by RT PCR NEGATIVE NEGATIVE Final    Comment: (NOTE) SARS-CoV-2 target nucleic acids are NOT DETECTED.  The SARS-CoV-2 RNA is generally detectable in upper respiratory specimens during the acute phase of infection. The lowest concentration of SARS-CoV-2 viral copies this assay can detect is 138 copies/mL. A negative result does not preclude SARS-Cov-2 infection and should not be used as the sole basis for treatment or other patient management decisions. A negative result may occur with  improper specimen collection/handling, submission of specimen other than nasopharyngeal swab, presence of viral mutation(s) within the areas targeted by this assay, and inadequate number of viral copies(<138 copies/mL). A negative result must be combined with clinical observations, patient history, and epidemiological information. The expected result is Negative.  Fact Sheet for Patients:  EntrepreneurPulse.com.au  Fact Sheet for Healthcare Providers:  IncredibleEmployment.be  This test is no t yet approved  or cleared by the [place] and  has been authorized for detection and/or diagnosis of SARS-CoV-2 by FDA under an Emergency Use Authorization (EUA). This EUA will remain  in effect (meaning this test can be used) for the duration of the COVID-19 declaration under Section 564(b)(1) of the Act, 21 U.S.C.section 360bbb-3(b)(1), unless the authorization is terminated  or revoked sooner.       Influenza A by PCR NEGATIVE NEGATIVE Final   Influenza B by PCR NEGATIVE NEGATIVE Final    Comment: (NOTE) The Xpert Xpress SARS-CoV-2/FLU/RSV plus assay is intended as an aid in the diagnosis of influenza from Nasopharyngeal swab specimens and should not be used as a sole basis for treatment. Nasal washings and aspirates are unacceptable for Xpert Xpress SARS-CoV-2/FLU/RSV testing.  Fact Sheet for  Patients: EntrepreneurPulse.com.au  Fact Sheet for Healthcare Providers: IncredibleEmployment.be  This test is not yet approved or cleared by the [place] FDA and has been authorized for detection and/or diagnosis of SARS-CoV-2 by FDA under an Emergency Use Authorization (EUA). This EUA will remain in effect (meaning this test can be used) for the duration of the COVID-19 declaration under Section 564(b)(1) of the Act, 21 U.S.C. section 360bbb-3(b)(1), unless the authorization is terminated or revoked.  Performed at [facility], [place] [address] [place], [place] [address]       Radiology Studies: DG Pelvis 1-2 Views  Result Date: 01/19/2022 CLINICAL DATA:  Known left hip fracture EXAM: PELVIS - 1 VIEW COMPARISON:  Films from earlier in the same day. FINDINGS: Pelvic ring is intact. Postsurgical changes in lumbar spine and pelvis are seen. Left subcapital femoral neck fracture is noted with impaction at the fracture site. No other focal abnormality is noted. IMPRESSION: Left femoral neck fracture with impaction. Electronically Signed   By: Inez Catalina M.D.   On: 01/19/2022 20:45   Chest Portable 1 View  Result Date: 01/19/2022 CLINICAL DATA:  Preop evaluation, known history of femoral neck fracture EXAM: PORTABLE CHEST 1 VIEW COMPARISON:  12/24/2015 FINDINGS: Cardiac shadow is within normal limits. Postsurgical changes in the left chest wall and left shoulder are seen. No acute bony abnormality is noted. The lungs are clear bilaterally. IMPRESSION: No acute abnormality noted. Electronically Signed   By: Inez Catalina M.D.   On: 01/19/2022 20:41   DG FEMUR PORT MIN 2 VIEWS LEFT  Result Date: 01/19/2022 CLINICAL DATA:  Known left femoral fracture EXAM: LEFT FEMUR PORTABLE 2 VIEWS COMPARISON:  None. FINDINGS: Left femoral neck fracture is noted with impaction at the fracture site. Distal femur is within normal limits. Degenerative changes about  the knee joint are seen. IMPRESSION: Left femoral neck fracture with impaction. Electronically Signed   By: Inez Catalina M.D.   On: 01/19/2022 20:45   VAS [place] LOWER EXTREMITY VENOUS (DVT)  Result Date: 01/20/2022  Lower Venous DVT Study Patient Name:  [name]  Date of Exam:   01/20/2022 Medical Rec #: [id number]       Accession #:    [id number] Date of Birth: 1936/09/21       Patient Gender: M Patient Age:   34 years Exam Location:  [facility] Procedure:      VAS [place] LOWER EXTREMITY VENOUS (DVT) Referring Phys: Bonnielee Haff --------------------------------------------------------------------------------  Indications: Edema.  Comparison Study: 01/31/21 prior Performing Technologist: Archie Patten RVS  Examination Guidelines: A complete evaluation includes B-mode imaging, spectral Doppler, color Doppler, and power Doppler as needed of all accessible portions of  each vessel. Bilateral testing is considered an integral part of a complete examination. Limited examinations for reoccurring indications may be performed as noted. The reflux portion of the exam is performed with the patient in reverse Trendelenburg.  +---------+---------------+---------+-----------+----------+-------------------+  RIGHT     Compressibility Phasicity Spontaneity Properties Thrombus Aging       +---------+---------------+---------+-----------+----------+-------------------+  CFV       Full            Yes       Yes                                         +---------+---------------+---------+-----------+----------+-------------------+  SFJ       Full                                                                  +---------+---------------+---------+-----------+----------+-------------------+  FV Prox   Full                                                                  +---------+---------------+---------+-----------+----------+-------------------+  FV Mid    Full                                                                   +---------+---------------+---------+-----------+----------+-------------------+  FV Distal Full                                                                  +---------+---------------+---------+-----------+----------+-------------------+  PFV       Full                                                                  +---------+---------------+---------+-----------+----------+-------------------+  POP       Full            Yes       Yes                                         +---------+---------------+---------+-----------+----------+-------------------+  PTV       Full                                                                  +---------+---------------+---------+-----------+----------+-------------------+  PERO                                                       Not well visualized  +---------+---------------+---------+-----------+----------+-------------------+   +---------+---------------+---------+-----------+----------+-------------------+  LEFT      Compressibility Phasicity Spontaneity Properties Thrombus Aging       +---------+---------------+---------+-----------+----------+-------------------+  CFV       Full            Yes       Yes                                         +---------+---------------+---------+-----------+----------+-------------------+  SFJ       Full                                                                  +---------+---------------+---------+-----------+----------+-------------------+  FV Prox   Full                                                                  +---------+---------------+---------+-----------+----------+-------------------+  FV Mid    Full                                                                  +---------+---------------+---------+-----------+----------+-------------------+  FV Distal Full                                                                  +---------+---------------+---------+-----------+----------+-------------------+   PFV       Full                                                                  +---------+---------------+---------+-----------+----------+-------------------+  POP       Full            Yes       Yes                                         +---------+---------------+---------+-----------+----------+-------------------+  PTV       Full                                                                  +---------+---------------+---------+-----------+----------+-------------------+  PERO                                                       Not well visualized  +---------+---------------+---------+-----------+----------+-------------------+     Summary: BILATERAL: - No evidence of deep vein thrombosis seen in the lower extremities, bilaterally. -No evidence of popliteal cyst, bilaterally.   *See table(s) above for measurements and observations.    Preliminary        LOS: 1 day   Zamariah Seaborn [employer] on [email or web address]  01/20/2022, 10:44 AM

## 2022-01-20 NOTE — Anesthesia Procedure Notes (Signed)
Procedure Name: Intubation Date/Time: 01/20/2022 10:54 AM Performed by: Trinna Post., CRNA Pre-anesthesia Checklist: Patient identified, Emergency Drugs available, Suction available, Patient being monitored and Timeout performed Patient Re-evaluated:Patient Re-evaluated prior to induction Oxygen Delivery Method: Circle system utilized Preoxygenation: Pre-oxygenation with 100% oxygen Induction Type: IV induction Ventilation: Mask ventilation without difficulty Laryngoscope Size: Mac and 4 Grade View: Grade I Tube type: Oral Tube size: 7.5 mm Number of attempts: 1 Airway Equipment and Method: Stylet Placement Confirmation: positive ETCO2, ETT inserted through vocal cords under direct vision and breath sounds checked- equal and bilateral Secured at: 22 cm Tube secured with: Tape Dental Injury: Teeth and Oropharynx as per pre-operative assessment  Comments: Engineer, manufacturing, [employer]
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
## 2022-01-20 NOTE — Discharge Instructions (Signed)
Diet: As you were doing prior to hospitalization   Shower:  May shower but keep the wounds dry, use an occlusive plastic wrap, NO SOAKING IN TUB.  If the bandage gets wet, change with a clean dry gauze.  If you have a splint on, leave the splint in place and keep the splint dry with a plastic bag.  Dressing:  Keep your dressing intact until follow up.  Activity:  Increase activity slowly as tolerated, but follow the weight bearing instructions below.  The rules on driving is that you can not be taking narcotics while you drive, and you must feel in control of the vehicle.    Weight Bearing:   weight bearing as tolerated with posterior hip precautions.    Blood clot prevention (DVT Prophylaxis): After surgery you are at an increased risk for a blood clot. you were prescribed a blood thinner, lovenox 40mg , to be taken once daily for a total of 4 weeks from surgery to help reduce your risk of getting a blood clot. This will help prevent a blood clot. Signs of a pulmonary embolus (blood clot in the lungs) include sudden short of breath, feeling lightheaded or dizzy, chest pain with a deep breath, rapid pulse rapid breathing. Signs of a blood clot in your arms or legs include new unexplained swelling and cramping, warm, red or darkened skin around the painful area. Please call the office or 911 right away if these signs or symptoms develop. To prevent constipation: you may use a stool softener such as -  Colace (over the counter) 100 mg by mouth twice a day  Drink plenty of fluids (prune juice may be helpful) and high fiber foods Miralax (over the counter) for constipation as needed.    Itching:  If you experience itching with your medications, try taking only a single pain pill, or even half a pain pill at a time.  You may take up to 10 pain pills per day, and you can also use benadryl over the counter for itching or also to help with sleep.   Precautions:  If you experience chest pain or shortness of  breath - call 911 immediately for transfer to the hospital emergency department!!   Call office [phone]) for the following: Temperature greater than 101F Persistent nausea and vomiting Severe uncontrolled pain Redness, tenderness, or signs of infection (pain, swelling, redness, odor or green/yellow discharge around the site) Difficulty breathing, headache or visual disturbances Hives Persistent dizziness or light-headedness Extreme fatigue Any other questions or concerns you may have after discharge  In an emergency, call 911 or go to an Emergency Department at a nearby hospital  Follow- Up Appointment:  Please call for an appointment to be seen approximately 2-3 week after surgery in [facility] with your surgeon Dr. Charlies Constable - [phone] Address: [address] [place], [place], [place] [address]

## 2022-01-20 NOTE — Transfer of Care (Signed)
Immediate Anesthesia Transfer of Care Note  Patient: [name]  Procedure(s) Performed: ARTHROPLASTY BIPOLAR HIP (HEMIARTHROPLASTY) (Left: Hip)  Patient Location: PACU  Anesthesia Type:General  Level of Consciousness: awake and drowsy  Airway & Oxygen Therapy: Patient Spontanous Breathing and Patient connected to face mask oxygen  Post-op Assessment: Report given to RN and Post -op Vital signs reviewed and stable  Post vital signs: Reviewed and stable  Last Vitals:  Vitals Value Taken Time  BP 128/78 01/20/22 1259  Temp    Pulse 86 01/20/22 1306  Resp 28 01/20/22 1306  SpO2 97 % 01/20/22 1306  Vitals shown include unvalidated device data.  Last Pain:  Vitals:   01/20/22 0726  TempSrc: Oral  PainSc:       Patients Stated Pain Goal: 3 (70/76/15 1834)  Complications: No notable events documented.

## 2022-01-20 NOTE — Progress Notes (Signed)
Pt seen in room, alert/oriented in no apparent distress. S/p left hip hemiarthroplasty. Dressing CDI. Spouse at bedside. No complaints.

## 2022-01-20 NOTE — Progress Notes (Signed)
Lower extremity venous has been completed.   Preliminary results in CV Proc.   Jinny Blossom Genevia Bouldin 01/20/2022 8:42 AM

## 2022-01-21 ENCOUNTER — Inpatient Hospital Stay (HOSPITAL_COMMUNITY): Payer: Medicare Other

## 2022-01-21 ENCOUNTER — Other Ambulatory Visit (HOSPITAL_COMMUNITY): Payer: Medicare Other

## 2022-01-21 LAB — BASIC METABOLIC PANEL
Anion gap: 8 (ref 5–15)
BUN: 23 mg/dL (ref 8–23)
CO2: 26 mmol/L (ref 22–32)
Calcium: 8.9 mg/dL (ref 8.9–10.3)
Chloride: 102 mmol/L (ref 98–111)
Creatinine, Ser: 1.34 mg/dL — ABNORMAL HIGH (ref 0.61–1.24)
GFR, Estimated: 53 mL/min — ABNORMAL LOW (ref >60)
Glucose, Bld: 120 mg/dL — ABNORMAL HIGH (ref 70–99)
Potassium: 4.9 mmol/L (ref 3.5–5.1)
Sodium: 136 mmol/L (ref 135–145)

## 2022-01-21 LAB — CBC
HCT: 38.8 % — ABNORMAL LOW (ref 39.0–52.0)
Hemoglobin: 12.5 g/dL — ABNORMAL LOW (ref 13.0–17.0)
MCH: 30.1 pg (ref 26.0–34.0)
MCHC: 32.2 g/dL (ref 30.0–36.0)
MCV: 93.5 fL (ref 80.0–100.0)
Platelets: 227 10*3/uL (ref 150–400)
RBC: 4.15 MIL/uL — ABNORMAL LOW (ref 4.22–5.81)
RDW: 14 % (ref 11.5–15.5)
WBC: 16.8 10*3/uL — ABNORMAL HIGH (ref 4.0–10.5)
nRBC: 0 % (ref 0.0–0.2)

## 2022-01-21 MED ORDER — SIMVASTATIN 20 MG PO TABS
40.0000 mg | ORAL_TABLET | Freq: Every day | ORAL | Status: DC
  Administered 2022-01-22 – 2022-01-24 (×3): 40 mg via ORAL
  Filled 2022-01-21 (×4): qty 2

## 2022-01-21 MED ORDER — METHOCARBAMOL 1000 MG/10ML IJ SOLN
500.0000 mg | Freq: Four times a day (QID) | INTRAVENOUS | Status: DC | PRN
  Filled 2022-01-21: qty 5

## 2022-01-21 MED ORDER — GABAPENTIN 300 MG PO CAPS
300.0000 mg | ORAL_CAPSULE | Freq: Three times a day (TID) | ORAL | Status: DC
  Administered 2022-01-21 – 2022-01-24 (×12): 300 mg via ORAL
  Filled 2022-01-21 (×13): qty 1

## 2022-01-21 MED ORDER — ASPIRIN 81 MG PO CHEW
81.0000 mg | CHEWABLE_TABLET | Freq: Every day | ORAL | Status: DC
  Administered 2022-01-22 – 2022-01-24 (×3): 81 mg via ORAL
  Filled 2022-01-21 (×4): qty 1

## 2022-01-21 MED ORDER — ASPIRIN 81 MG PO TABS
81.0000 mg | ORAL_TABLET | Freq: Every day | ORAL | Status: DC
  Administered 2022-01-21: 81 mg via ORAL

## 2022-01-21 MED ORDER — DONEPEZIL HCL 5 MG PO TABS
5.0000 mg | ORAL_TABLET | Freq: Every day | ORAL | Status: DC
  Administered 2022-01-21 – 2022-01-24 (×4): 5 mg via ORAL
  Filled 2022-01-21 (×4): qty 1

## 2022-01-21 MED ORDER — SENNOSIDES-DOCUSATE SODIUM 8.6-50 MG PO TABS: 2.0000 | ORAL_TABLET | Freq: Every evening | ORAL | Status: DC | PRN

## 2022-01-21 MED ORDER — POLYETHYLENE GLYCOL 3350 17 G PO PACK: 17.0000 g | PACK | Freq: Every day | ORAL | Status: DC | PRN

## 2022-01-21 NOTE — Progress Notes (Signed)
° °  ORTHOPAEDIC PROGRESS NOTE  s/p Procedure(s): ARTHROPLASTY BIPOLAR HIP (HEMIARTHROPLASTY) on 01/20/2022 with Dr. Zachery Dakins  SUBJECTIVE: Reports moderate pain about operative site. Requesting breakfast. No chest pain. No SOB. No nausea/vomiting. No other complaints.  OBJECTIVE: PE: General: resting in hospital bed, NAD LLE: incision CDI, leg lengths equal, intact EHL/TA/GSC, he endorses distal sensation, chronic lower extremity edema and stasis dermatitis, warm well perfused foot   Vitals:   01/21/22 0406 01/21/22 0807  BP: 117/72 117/70  Pulse: (!) 108 91  Resp: 20 20  Temp: 100.3 F (37.9 C) 98 F (36.7 C)  SpO2: 95% 94%   Stable post-op images.   ASSESSMENT: [name] is a 86 y.o. male POD#1  PLAN: Weightbearing: WBAT LLE Insicional and dressing care: Reinforce dressings as needed Orthopedic device(s): None Showering: Hold for now VTE prophylaxis: Lovenox 40mg  qd  Pain control: PRN pain medications, minimize narcotics as able Follow - up plan: 2 weeks in office with Dr. Zachery Dakins Dispo: PT/OT evaluations pending. Likely SNF.  Contact information:  After hours and holidays please check [email or web address] for group call information for [facility]   Noemi Chapel, PA-C 01/21/2022

## 2022-01-21 NOTE — Plan of Care (Signed)

## 2022-01-21 NOTE — Progress Notes (Signed)
[facility] PROGRESS NOTE   [name] UXL:[id number] DOB: [birth year] DOA: 01/19/2022  2 DOS: the patient was seen and examined on 01/21/2022  PCP: Shon Baton, MD  Brief History and Hospital Course:  86 y.o. male with medical history significant of lumbosacral radiculopathy, remote history of bladder and prostate cancer followed by urology at [facility], hyperlipidemia, lymphedema presented to [facility] orthopedics office with left hip pain and difficulty ambulating.  Imaging revealed displaced left femoral neck fracture.  He was hospitalized for further management.  Orthopedics was consulted.  Underwent surgery on 01/20/2022.  Consultants: Orthopedics  Procedures:   Left hip arthroplasty 2/3  Lower extremity Doppler studies    Subjective: Patient complains of muscle spasms in his operative leg.  Pain is 5 out of 10 in intensity.  No chest pain shortness of breath nausea vomiting diarrhea.  No dysuria.     Assessment/Plan:  * Closed displaced fracture of left femoral neck ([place])- (present on admission) Patient was seen by orthopedics.  Underwent surgery on 01/20/2022.  Continues to have significant pain and muscle spasms.  Will initiate Robaxin.  Continue with narcotic pain medications.  Start bowel regimen.  PT and OT evaluation. Low-grade fever noted this morning.  WBC noted to be elevated which could be reactive.  Will check UA.  Patient denies any cough shortness of breath dysuria abdominal pain or diarrhea.  Bilateral lower extremity edema- (present on admission) Patient has chronic lower extremity edema.  He has a history of lymphedema.  Lower extremity Doppler studies without DVT.  Compression stockings.  Keep lower extremities elevated.   BNP was normal.  Echocardiogram is pending. He is noted to have low-grade fever this morning and initially there was some concern for cellulitis but patient mentions that the erythema is about the same as before.  For now we will  continue to monitor.  If his WBC continues to climb and if he has high fevers then may have to consider treatment.  Although he has noted to be on cefadroxil after surgery.  This was initiated by orthopedics postoperatively.  Elevated serum creatinine- (present on admission) Noted to have creatinine of 1.33 at admission.  Old labs reviewed.  Creatinine was 1.46 in February 2022.  Quite possible he has an element of chronic disease but unknown stage at this time.  Will need to be worked up in the outpatient setting.   Renal function stable.  Avoid nephrotoxic agents.    History of bladder cancer History of prostate cancer  Remote history of bladder and prostate cancer currently not on treatment. -Outpatient urology follow-up  HLD (hyperlipidemia)- (present on admission) Can resume statin.      DVT Prophylaxis: Lovenox Code Status: DNR Family Communication: Discussed with patient.  No family at bedside Disposition Plan: To be determined  Status is: Inpatient Remains inpatient appropriate because: Left hip fracture.       Medications: Scheduled:  cefadroxil  500 mg Oral BID   enoxaparin  40 mg Subcutaneous Q24H   feeding supplement  237 mL Oral TID BM   multivitamin with minerals  1 tablet Oral Daily   mupirocin ointment  1 application Nasal BID   Continuous:  methocarbamol (ROBAXIN) IV     ZDG:UYQIHKVQQVZDG, HYDROcodone-acetaminophen, methocarbamol (ROBAXIN) IV, morphine injection  Antibiotics: Anti-infectives (From admission, onward)    Start     Dose/Rate Route Frequency Ordered Stop   01/21/22 2100  cefadroxil (DURICEF) capsule 500 mg        500 mg  Oral 2 times daily 01/20/22 1520 01/28/22 2159   01/20/22 1615  ceFAZolin (ANCEF) IVPB 2g/100 mL premix        2 g 200 mL/hr over 30 Minutes Intravenous Every 8 hours 01/20/22 1520 01/20/22 2236   01/20/22 0900  ceFAZolin (ANCEF) IVPB 2g/100 mL premix        2 g 200 mL/hr over 30 Minutes Intravenous On call to O.R.  01/20/22 0409 01/20/22 1130       Objective:  Vital Signs  Vitals:   01/20/22 1949 01/20/22 2354 01/21/22 0406 01/21/22 0807  BP: 122/83 105/75 117/72 117/70  Pulse: 98 (!) 108 (!) 108 91  Resp: 20 18 20 20   Temp: 99.1 F (37.3 C) 98.5 F (36.9 C) 100.3 F (37.9 C) 98 F (36.7 C)  TempSrc:  Oral Oral Oral  SpO2:  95% 95% 94%  Weight:      Height:        Intake/Output Summary (Last 24 hours) at 01/21/2022 1006 Last data filed at 01/21/2022 0300 Gross per 24 hour  Intake 1240 ml  Output 250 ml  Net 990 ml   Filed Weights   01/20/22 0939  Weight: 102.1 kg    General appearance: Awake alert.  In no distress Resp: Clear to auscultation bilaterally.  Normal effort Cardio: S1-S2 is normal regular.  No S3-S4.  No rubs murmurs or bruit GI: Abdomen is soft.  Nontender nondistended.  Bowel sounds are present normal.  No masses organomegaly Extremities: Mild swelling noted over the operative site on the left leg.  Continues to have edema bilateral lower extremities with erythema over anterior legs.  Stable from yesterday. Neurologic: Alert and oriented x3.  No focal neurological deficits.     Lab Results:  Data Reviewed: I have personally reviewed labs and imaging study reports  CBC: Recent Labs  Lab 01/19/22 1947 01/21/22 0622  WBC 7.7 16.8*  HGB 13.5 12.5*  HCT 41.6 38.8*  MCV 95.2 93.5  PLT 209 962    Basic Metabolic Panel: Recent Labs  Lab 01/19/22 1947 01/21/22 0622  NA 139 136  K 4.7 4.9  CL 104 102  CO2 28 26  GLUCOSE 96 120*  BUN 27* 23  CREATININE 1.33* 1.34*  CALCIUM 9.3 8.9    GFR: Estimated Creatinine Clearance: 53.3 mL/min (A) (by C-G formula based on SCr of 1.34 mg/dL (H)).  Liver Function Tests: Recent Labs  Lab 01/19/22 1947  AST 21  ALT 16  ALKPHOS 97  BILITOT 0.6  PROT 6.5  ALBUMIN 3.3*     Coagulation Profile: Recent Labs  Lab 01/19/22 1947  INR 1.1     Recent Results (from the past 240 hour(s))  Surgical PCR  screen     Status: None   Collection Time: 01/19/22  7:00 PM   Specimen: Nasal Mucosa; Nasal Swab  Result Value Ref Range Status   MRSA, PCR NEGATIVE NEGATIVE Final   Staphylococcus aureus NEGATIVE NEGATIVE Final    Comment: (NOTE) The Xpert SA Assay (FDA approved for NASAL specimens in patients 53 years of age and older), is one component of a comprehensive surveillance program. It is not intended to diagnose infection nor to guide or monitor treatment. Performed at [facility], [place] [address] [place],  [address]   Resp Panel by RT-PCR (Flu A&B, Covid) Nasopharyngeal Swab     Status: None   Collection Time: 01/20/22  7:30 AM   Specimen: Nasopharyngeal Swab; Nasopharyngeal(NP) swabs in vial transport medium  Result Value Ref Range Status   SARS Coronavirus 2 by RT PCR NEGATIVE NEGATIVE Final    Comment: (NOTE) SARS-CoV-2 target nucleic acids are NOT DETECTED.  The SARS-CoV-2 RNA is generally detectable in upper respiratory specimens during the acute phase of infection. The lowest concentration of SARS-CoV-2 viral copies this assay can detect is 138 copies/mL. A negative result does not preclude SARS-Cov-2 infection and should not be used as the sole basis for treatment or other patient management decisions. A negative result may occur with  improper specimen collection/handling, submission of specimen other than nasopharyngeal swab, presence of viral mutation(s) within the areas targeted by this assay, and inadequate number of viral copies(<138 copies/mL). A negative result must be combined with clinical observations, patient history, and epidemiological information. The expected result is Negative.  Fact Sheet for Patients:  EntrepreneurPulse.com.au  Fact Sheet for Healthcare Providers:  IncredibleEmployment.be  This test is no t yet approved or cleared by the [place] FDA and  has been authorized for detection and/or  diagnosis of SARS-CoV-2 by FDA under an Emergency Use Authorization (EUA). This EUA will remain  in effect (meaning this test can be used) for the duration of the COVID-19 declaration under Section 564(b)(1) of the Act, 21 U.S.C.section 360bbb-3(b)(1), unless the authorization is terminated  or revoked sooner.       Influenza A by PCR NEGATIVE NEGATIVE Final   Influenza B by PCR NEGATIVE NEGATIVE Final    Comment: (NOTE) The Xpert Xpress SARS-CoV-2/FLU/RSV plus assay is intended as an aid in the diagnosis of influenza from Nasopharyngeal swab specimens and should not be used as a sole basis for treatment. Nasal washings and aspirates are unacceptable for Xpert Xpress SARS-CoV-2/FLU/RSV testing.  Fact Sheet for Patients: EntrepreneurPulse.com.au  Fact Sheet for Healthcare Providers: IncredibleEmployment.be  This test is not yet approved or cleared by the [place] FDA and has been authorized for detection and/or diagnosis of SARS-CoV-2 by FDA under an Emergency Use Authorization (EUA). This EUA will remain in effect (meaning this test can be used) for the duration of the COVID-19 declaration under Section 564(b)(1) of the Act, 21 U.S.C. section 360bbb-3(b)(1), unless the authorization is terminated or revoked.  Performed at [facility], [place] [address], [place], [place] [address]       Radiology Studies: DG Pelvis 1-2 Views  Result Date: 01/19/2022 CLINICAL DATA:  Known left hip fracture EXAM: PELVIS - 1 VIEW COMPARISON:  Films from earlier in the same day. FINDINGS: Pelvic ring is intact. Postsurgical changes in lumbar spine and pelvis are seen. Left subcapital femoral neck fracture is noted with impaction at the fracture site. No other focal abnormality is noted. IMPRESSION: Left femoral neck fracture with impaction. Electronically Signed   By: Inez Catalina M.D.   On: 01/19/2022 20:45   Chest Portable 1 View  Result Date:  01/19/2022 CLINICAL DATA:  Preop evaluation, known history of femoral neck fracture EXAM: PORTABLE CHEST 1 VIEW COMPARISON:  12/24/2015 FINDINGS: Cardiac shadow is within normal limits. Postsurgical changes in the left chest wall and left shoulder are seen. No acute bony abnormality is noted. The lungs are clear bilaterally. IMPRESSION: No acute abnormality noted. Electronically Signed   By: Inez Catalina M.D.   On: 01/19/2022 20:41   DG HIP UNILAT WITH PELVIS 1V LEFT  Result Date: 01/20/2022 CLINICAL DATA:  Fracture proximal left femur EXAM: DG HIP (WITH OR WITHOUT PELVIS) 1V*L* COMPARISON:  01/19/2022 FINDINGS: There is interval left hip arthroplasty. No fracture lines  are seen. Surgical clips are seen in the pelvis. IMPRESSION: Interval left hip arthroplasty. Electronically Signed   By: Elmer Picker M.D.   On: 01/20/2022 12:32   DG HIP OPERATIVE UNILAT W OR W/O PELVIS LEFT  Result Date: 01/20/2022 CLINICAL DATA:  Postoperative LEFT hip arthroplasty. EXAM: OPERATIVE LEFT HIP (WITH PELVIS IF PERFORMED) 2 VIEWS TECHNIQUE: Fluoroscopic spot image(s) were submitted for interpretation post-operatively. COMPARISON:  Exam of earlier the same date. FINDINGS: Surgical clips are noted about the pelvis. Interval LEFT hip arthroplasty. Acetabular component appears well placed on the AP projection but cannot be well evaluated on the lateral projection due to artifact from adjacent support apparatus. Gas present in the soft tissues about the LEFT hip compatible with recent surgery. Femoral component appears well seated without signs of acute process. Limited view of the pelvis excludes the upper iliac crests. Pelvis otherwise unremarkable. IMPRESSION: Interval LEFT hip arthroplasty as above. No immediate complications. Postoperative changes about the LEFT hip. Acetabular component not well assessed on cross-table lateral due to adjacent support devices. Electronically Signed   By: Zetta Bills M.D.   On: 01/20/2022  13:52   DG FEMUR PORT MIN 2 VIEWS LEFT  Result Date: 01/19/2022 CLINICAL DATA:  Known left femoral fracture EXAM: LEFT FEMUR PORTABLE 2 VIEWS COMPARISON:  None. FINDINGS: Left femoral neck fracture is noted with impaction at the fracture site. Distal femur is within normal limits. Degenerative changes about the knee joint are seen. IMPRESSION: Left femoral neck fracture with impaction. Electronically Signed   By: Inez Catalina M.D.   On: 01/19/2022 20:45   VAS [place] LOWER EXTREMITY VENOUS (DVT)  Result Date: 01/20/2022  Lower Venous DVT Study Patient Name:  [name] Crisman  Date of Exam:   01/20/2022 Medical Rec #: [id number]       Accession #:    [id number] Date of Birth: 1936-09-10       Patient Gender: M Patient Age:   61 years Exam Location:  [facility] Procedure:      VAS [place] LOWER EXTREMITY VENOUS (DVT) Referring Phys: Bonnielee Haff --------------------------------------------------------------------------------  Indications: Edema.  Comparison Study: 01/31/21 prior Performing Technologist: Archie Patten RVS  Examination Guidelines: A complete evaluation includes B-mode imaging, spectral Doppler, color Doppler, and power Doppler as needed of all accessible portions of each vessel. Bilateral testing is considered an integral part of a complete examination. Limited examinations for reoccurring indications may be performed as noted. The reflux portion of the exam is performed with the patient in reverse Trendelenburg.  +---------+---------------+---------+-----------+----------+-------------------+  RIGHT     Compressibility Phasicity Spontaneity Properties Thrombus Aging       +---------+---------------+---------+-----------+----------+-------------------+  CFV       Full            Yes       Yes                                         +---------+---------------+---------+-----------+----------+-------------------+  SFJ       Full                                                                   +---------+---------------+---------+-----------+----------+-------------------+  FV Prox   Full                                                                  +---------+---------------+---------+-----------+----------+-------------------+  FV Mid    Full                                                                  +---------+---------------+---------+-----------+----------+-------------------+  FV Distal Full                                                                  +---------+---------------+---------+-----------+----------+-------------------+  PFV       Full                                                                  +---------+---------------+---------+-----------+----------+-------------------+  POP       Full            Yes       Yes                                         +---------+---------------+---------+-----------+----------+-------------------+  PTV       Full                                                                  +---------+---------------+---------+-----------+----------+-------------------+  PERO                                                       Not well visualized  +---------+---------------+---------+-----------+----------+-------------------+   +---------+---------------+---------+-----------+----------+-------------------+  LEFT      Compressibility Phasicity Spontaneity Properties Thrombus Aging       +---------+---------------+---------+-----------+----------+-------------------+  CFV       Full            Yes       Yes                                         +---------+---------------+---------+-----------+----------+-------------------+  SFJ       Full                                                                  +---------+---------------+---------+-----------+----------+-------------------+  FV Prox   Full                                                                  +---------+---------------+---------+-----------+----------+-------------------+  FV  Mid    Full                                                                  +---------+---------------+---------+-----------+----------+-------------------+  FV Distal Full                                                                  +---------+---------------+---------+-----------+----------+-------------------+  PFV       Full                                                                  +---------+---------------+---------+-----------+----------+-------------------+  POP       Full            Yes       Yes                                         +---------+---------------+---------+-----------+----------+-------------------+  PTV       Full                                                                  +---------+---------------+---------+-----------+----------+-------------------+  PERO                                                       Not well visualized  +---------+---------------+---------+-----------+----------+-------------------+     Summary: BILATERAL: - No evidence of deep vein thrombosis seen in the lower extremities, bilaterally. -No evidence of popliteal cyst, bilaterally.   *See table(s) above for measurements and observations.    Preliminary        LOS: 2 days   [facility] Pager on [email or web address]  01/21/2022, 10:06 AM

## 2022-01-21 NOTE — Progress Notes (Signed)
Echo attempted. Called away for STAT echo by cardiology before test could begin. Will attempt again as schedule permits.

## 2022-01-22 ENCOUNTER — Inpatient Hospital Stay (HOSPITAL_COMMUNITY): Payer: Medicare Other

## 2022-01-22 ENCOUNTER — Other Ambulatory Visit (HOSPITAL_COMMUNITY): Payer: Medicare Other

## 2022-01-22 ENCOUNTER — Encounter (HOSPITAL_COMMUNITY): Payer: Self-pay | Admitting: Internal Medicine

## 2022-01-22 ENCOUNTER — Other Ambulatory Visit: Payer: Self-pay

## 2022-01-22 DIAGNOSIS — I5030 Unspecified diastolic (congestive) heart failure: Secondary | ICD-10-CM

## 2022-01-22 DIAGNOSIS — D649 Anemia, unspecified: Secondary | ICD-10-CM

## 2022-01-22 LAB — CBC
HCT: 32.6 % — ABNORMAL LOW (ref 39.0–52.0)
Hemoglobin: 10.9 g/dL — ABNORMAL LOW (ref 13.0–17.0)
MCH: 31.4 pg (ref 26.0–34.0)
MCHC: 33.4 g/dL (ref 30.0–36.0)
MCV: 93.9 fL (ref 80.0–100.0)
Platelets: 166 10*3/uL (ref 150–400)
RBC: 3.47 MIL/uL — ABNORMAL LOW (ref 4.22–5.81)
RDW: 14.4 % (ref 11.5–15.5)
WBC: 13.8 10*3/uL — ABNORMAL HIGH (ref 4.0–10.5)
nRBC: 0 % (ref 0.0–0.2)

## 2022-01-22 LAB — ECHOCARDIOGRAM COMPLETE
AR max vel: 1.65 cm2
AV Area VTI: 1.76 cm2
AV Area mean vel: 1.71 cm2
AV Mean grad: 12 mmHg
AV Peak grad: 23.5 mmHg
Ao pk vel: 2.43 m/s
Area-P 1/2: 5.02 cm2
Calc EF: 64.7 %
Height: 74 in
MV VTI: 3.63 cm2
S' Lateral: 2.5 cm
Single Plane A2C EF: 65.3 %
Single Plane A4C EF: 61.6 %
Weight: 3600 oz

## 2022-01-22 LAB — BASIC METABOLIC PANEL
Anion gap: 8 (ref 5–15)
BUN: 30 mg/dL — ABNORMAL HIGH (ref 8–23)
CO2: 24 mmol/L (ref 22–32)
Calcium: 8.3 mg/dL — ABNORMAL LOW (ref 8.9–10.3)
Chloride: 103 mmol/L (ref 98–111)
Creatinine, Ser: 1.51 mg/dL — ABNORMAL HIGH (ref 0.61–1.24)
GFR, Estimated: 46 mL/min — ABNORMAL LOW (ref >60)
Glucose, Bld: 121 mg/dL — ABNORMAL HIGH (ref 70–99)
Potassium: 4.4 mmol/L (ref 3.5–5.1)
Sodium: 135 mmol/L (ref 135–145)

## 2022-01-22 MED ORDER — SODIUM CHLORIDE 0.9 % IV SOLN: INTRAVENOUS | Status: AC

## 2022-01-22 NOTE — Plan of Care (Signed)

## 2022-01-22 NOTE — Assessment & Plan Note (Addendum)
Drop in hemoglobin is likely due to dilution as well as recent surgery.  Anemia panel reviewed.  No clear-cut deficiencies identified.  Hemoglobin is stable.

## 2022-01-22 NOTE — Progress Notes (Signed)
Subjective: 2 Days Post-Op s/p Procedure(s): ARTHROPLASTY BIPOLAR HIP (HEMIARTHROPLASTY)   Patient somnolent initially but easily aroused. Able to sleep last night with minimal pain. No pain this morning. Denies chest pain, SOB, Calf pain. No nausea/vomiting. No other complaints.  Objective:  PE: VITALS:   Vitals:   01/21/22 1609 01/21/22 2149 01/22/22 0432 01/22/22 0804  BP: 127/81 120/65 101/62 103/72  Pulse: 95 90 (!) 107 85  Resp: 19 18 18 18   Temp: 98.2 F (36.8 C) 99.7 F (37.6 C) 99.6 F (37.6 C) 99.5 F (37.5 C)  TempSrc: Oral Oral Oral Oral  SpO2: 96% 96% 93% 93%  Weight:      Height:       General: laying in bed, in no acute distress Resp: no increased work of breathing GI: soft, nontender abdomen MSK: Surgical incision CDI, dressing in place. Dorsiflexion and plantarflexion at ankle intact. Distal sensation intact. Foot warm and well perfused.   LABS  Results for orders placed or performed during the hospital encounter of 01/19/22 (from the past 24 hour(s))  CBC     Status: Abnormal   Collection Time: 01/22/22  2:55 AM  Result Value Ref Range   WBC 13.8 (H) 4.0 - 10.5 K/uL   RBC 3.47 (L) 4.22 - 5.81 MIL/uL   Hemoglobin 10.9 (L) 13.0 - 17.0 g/dL   HCT 32.6 (L) 39.0 - 52.0 %   MCV 93.9 80.0 - 100.0 fL   MCH 31.4 26.0 - 34.0 pg   MCHC 33.4 30.0 - 36.0 g/dL   RDW 14.4 11.5 - 15.5 %   Platelets 166 150 - 400 K/uL   nRBC 0.0 0.0 - 0.2 %  Basic metabolic panel     Status: Abnormal   Collection Time: 01/22/22  2:55 AM  Result Value Ref Range   Sodium 135 135 - 145 mmol/L   Potassium 4.4 3.5 - 5.1 mmol/L   Chloride 103 98 - 111 mmol/L   CO2 24 22 - 32 mmol/L   Glucose, Bld 121 (H) 70 - 99 mg/dL   BUN 30 (H) 8 - 23 mg/dL   Creatinine, Ser 1.51 (H) 0.61 - 1.24 mg/dL   Calcium 8.3 (L) 8.9 - 10.3 mg/dL   GFR, Estimated 46 (L) >60 mL/min   Anion gap 8 5 - 15    DG HIP UNILAT WITH PELVIS 1V LEFT  Result Date: 01/20/2022 CLINICAL DATA:  Fracture  proximal left femur EXAM: DG HIP (WITH OR WITHOUT PELVIS) 1V*L* COMPARISON:  01/19/2022 FINDINGS: There is interval left hip arthroplasty. No fracture lines are seen. Surgical clips are seen in the pelvis. IMPRESSION: Interval left hip arthroplasty. Electronically Signed   By: Elmer Picker M.D.   On: 01/20/2022 12:32   DG HIP OPERATIVE UNILAT W OR W/O PELVIS LEFT  Result Date: 01/20/2022 CLINICAL DATA:  Postoperative LEFT hip arthroplasty. EXAM: OPERATIVE LEFT HIP (WITH PELVIS IF PERFORMED) 2 VIEWS TECHNIQUE: Fluoroscopic spot image(s) were submitted for interpretation post-operatively. COMPARISON:  Exam of earlier the same date. FINDINGS: Surgical clips are noted about the pelvis. Interval LEFT hip arthroplasty. Acetabular component appears well placed on the AP projection but cannot be well evaluated on the lateral projection due to artifact from adjacent support apparatus. Gas present in the soft tissues about the LEFT hip compatible with recent surgery. Femoral component appears well seated without signs of acute process. Limited view of the pelvis excludes the upper iliac crests. Pelvis otherwise unremarkable. IMPRESSION: Interval LEFT hip arthroplasty as above.  No immediate complications. Postoperative changes about the LEFT hip. Acetabular component not well assessed on cross-table lateral due to adjacent support devices. Electronically Signed   By: Zetta Bills M.D.   On: 01/20/2022 13:52   VAS [place] LOWER EXTREMITY VENOUS (DVT)  Result Date: 01/21/2022  Lower Venous DVT Study Patient Name:  [name] Vickrey  Date of Exam:   01/20/2022 Medical Rec #: [id number]       Accession #:    [id number] Date of Birth: 06/13/1936       Patient Gender: M Patient Age:   86 years Exam Location:  [facility] Procedure:      VAS [place] LOWER EXTREMITY VENOUS (DVT) Referring Phys: Bonnielee Haff --------------------------------------------------------------------------------  Indications: Edema.  Comparison Study:  01/31/21 prior Performing Technologist: Archie Patten RVS  Examination Guidelines: A complete evaluation includes B-mode imaging, spectral Doppler, color Doppler, and power Doppler as needed of all accessible portions of each vessel. Bilateral testing is considered an integral part of a complete examination. Limited examinations for reoccurring indications may be performed as noted. The reflux portion of the exam is performed with the patient in reverse Trendelenburg.  +---------+---------------+---------+-----------+----------+-------------------+  RIGHT     Compressibility Phasicity Spontaneity Properties Thrombus Aging       +---------+---------------+---------+-----------+----------+-------------------+  CFV       Full            Yes       Yes                                         +---------+---------------+---------+-----------+----------+-------------------+  SFJ       Full                                                                  +---------+---------------+---------+-----------+----------+-------------------+  FV Prox   Full                                                                  +---------+---------------+---------+-----------+----------+-------------------+  FV Mid    Full                                                                  +---------+---------------+---------+-----------+----------+-------------------+  FV Distal Full                                                                  +---------+---------------+---------+-----------+----------+-------------------+  PFV       Full                                                                  +---------+---------------+---------+-----------+----------+-------------------+  POP       Full            Yes       Yes                                         +---------+---------------+---------+-----------+----------+-------------------+  PTV       Full                                                                   +---------+---------------+---------+-----------+----------+-------------------+  PERO                                                       Not well visualized  +---------+---------------+---------+-----------+----------+-------------------+   +---------+---------------+---------+-----------+----------+-------------------+  LEFT      Compressibility Phasicity Spontaneity Properties Thrombus Aging       +---------+---------------+---------+-----------+----------+-------------------+  CFV       Full            Yes       Yes                                         +---------+---------------+---------+-----------+----------+-------------------+  SFJ       Full                                                                  +---------+---------------+---------+-----------+----------+-------------------+  FV Prox   Full                                                                  +---------+---------------+---------+-----------+----------+-------------------+  FV Mid    Full                                                                  +---------+---------------+---------+-----------+----------+-------------------+  FV Distal Full                                                                  +---------+---------------+---------+-----------+----------+-------------------+  PFV       Full                                                                  +---------+---------------+---------+-----------+----------+-------------------+  POP       Full            Yes       Yes                                         +---------+---------------+---------+-----------+----------+-------------------+  PTV       Full                                                                  +---------+---------------+---------+-----------+----------+-------------------+  PERO                                                       Not well visualized  +---------+---------------+---------+-----------+----------+-------------------+      Summary: BILATERAL: - No evidence of deep vein thrombosis seen in the lower extremities, bilaterally. -No evidence of popliteal cyst, bilaterally.   *See table(s) above for measurements and observations. Electronically signed by Harold Barban MD on 01/21/2022 at 5:31:44 PM.    Final     Assessment: Closed left hip fracture  2 Days Post-Op s/p Procedure(s): ARTHROPLASTY BIPOLAR HIP (HEMIARTHROPLASTY)  PLAN: Afebrile since early yesterday morning. Hbg stable at 10.7. Weightbearing: WBAT LLE Insicional and dressing care: Reinforce dressings as needed Orthopedic device(s): None Showering: Hold for now VTE prophylaxis: Lovenox 40mg  qd  Pain control: PRN pain medications, minimize narcotics as able Follow - up plan: 2 weeks in office with Dr. Zachery Dakins Dispo: PT/OT evaluations pending. Likely SNF.   Contact information:  After hours and holidays please check [email or web address] for group call information for [facility]    Ventura Bruns 01/22/2022, 8:37 AM
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
## 2022-01-22 NOTE — Progress Notes (Signed)
[facility] PROGRESS NOTE   [name] ZOX:[id number] DOB: 09-Sep-1936 DOA: 01/19/2022  3 DOS: the patient was seen and examined on 01/22/2022  PCP: Shon Baton, MD  Brief History and Hospital Course:  86 y.o. male with medical history significant of lumbosacral radiculopathy, remote history of bladder and prostate cancer followed by urology at [facility], hyperlipidemia, lymphedema presented to [facility] orthopedics office with left hip pain and difficulty ambulating.  Imaging revealed displaced left femoral neck fracture.  He was hospitalized for further management.  Orthopedics was consulted.  Underwent surgery on 01/20/2022.  Stable for the most part.  Consultants: Orthopedics  Procedures:   Left hip arthroplasty 2/3  Lower extremity Doppler studies    Subjective: Patient sleepy this morning though easily arousable.  States that pain is better.  Denies any chest pain shortness of breath.  No nausea vomiting.      Assessment/Plan:  * Closed displaced fracture of left femoral neck ([place])- (present on admission) Patient was seen by orthopedics.  Underwent surgery on 01/20/2022.  Started on Robaxin yesterday apart from his narcotic pain medications.  Pain is better controlled today.  Continue bowel regimen.  PT and OT evaluation is pending.  Bilateral lower extremity edema- (present on admission) Patient has chronic lower extremity edema.  He has a history of lymphedema.  Lower extremity Doppler studies without DVT.  Compression stockings.  Keep lower extremities elevated.   BNP was normal.  Echocardiogram is pending. Low-grade fevers noted.  WBC noted to be better today at 13.8.  Noted to be on cefadroxil per orthopedics. Continue to monitor.  No obvious source of infection identified otherwise.  Concern was for cellulitis of his lower extremities but those changes appear to be chronic.  Normocytic anemia Drop in hemoglobin is likely due to dilution as well as recent surgery.   Check anemia panel.  Recheck hemoglobin tomorrow.  Elevated serum creatinine- (present on admission) Noted to have creatinine of 1.33 at admission.  Old labs reviewed.  Creatinine was 1.46 in February 2022.  Quite possible he has an element of chronic disease but unknown stage at this time.  Will need to be worked up in the outpatient setting.   Mildly increased BUN and creatinine noted today.  Could be from poor oral intake.  Gently hydrate for a few hours.  Recheck labs tomorrow.  Monitor urine output.  Avoid nephrotoxic agents.  History of bladder cancer History of prostate cancer  Remote history of bladder and prostate cancer currently not on treatment. -Outpatient urology follow-up  HLD (hyperlipidemia)- (present on admission) Can resume statin.      DVT Prophylaxis: Lovenox Code Status: DNR Family Communication: Discussed with patient.  No family at bedside Disposition Plan: To be determined  Status is: Inpatient Remains inpatient appropriate because: Left hip fracture.       Medications: Scheduled:  aspirin  81 mg Oral Daily   cefadroxil  500 mg Oral BID   donepezil  5 mg Oral QHS   enoxaparin  40 mg Subcutaneous Q24H   feeding supplement  237 mL Oral TID BM   gabapentin  300 mg Oral TID   multivitamin with minerals  1 tablet Oral Daily   mupirocin ointment  1 application Nasal BID   simvastatin  40 mg Oral Q breakfast   Continuous:  sodium chloride 75 mL/hr at 01/22/22 0705   methocarbamol (ROBAXIN) IV     WJX:BJYNWGNFAOZHY, HYDROcodone-acetaminophen, methocarbamol (ROBAXIN) IV, morphine injection, polyethylene glycol, senna-docusate  Antibiotics: Anti-infectives (  From admission, onward)    Start     Dose/Rate Route Frequency Ordered Stop   01/21/22 2100  cefadroxil (DURICEF) capsule 500 mg        500 mg Oral 2 times daily 01/20/22 1520 01/28/22 2159   01/20/22 1615  ceFAZolin (ANCEF) IVPB 2g/100 mL premix        2 g 200 mL/hr over 30 Minutes  Intravenous Every 8 hours 01/20/22 1520 01/20/22 2236   01/20/22 0900  ceFAZolin (ANCEF) IVPB 2g/100 mL premix        2 g 200 mL/hr over 30 Minutes Intravenous On call to O.R. 01/20/22 0409 01/20/22 1130       Objective:  Vital Signs  Vitals:   01/21/22 1609 01/21/22 2149 01/22/22 0432 01/22/22 0804  BP: 127/81 120/65 101/62 103/72  Pulse: 95 90 (!) 107 85  Resp: 19 18 18 18   Temp: 98.2 F (36.8 C) 99.7 F (37.6 C) 99.6 F (37.6 C) 99.5 F (37.5 C)  TempSrc: Oral Oral Oral Oral  SpO2: 96% 96% 93% 93%  Weight:      Height:        Intake/Output Summary (Last 24 hours) at 01/22/2022 0924 Last data filed at 01/21/2022 2300 Gross per 24 hour  Intake 480 ml  Output 2 ml  Net 478 ml   Filed Weights   01/20/22 0939  Weight: 102.1 kg    General appearance: Somnolent but easily arousable.  No distress Resp: Clear to auscultation bilaterally.  Normal effort Cardio: S1-S2 is normal regular.  No S3-S4.  No rubs murmurs or bruit GI: Abdomen is soft.  Nontender nondistended.  Bowel sounds are present normal.  No masses organomegaly Extremities: Compression stockings noted over bilateral lower extremities which have chronic edema and erythema anteriorly. Neurologic: No focal neurological deficits.     Lab Results:  Data Reviewed: I have personally reviewed labs and imaging study reports  CBC: Recent Labs  Lab 01/19/22 1947 01/21/22 0622 01/22/22 0255  WBC 7.7 16.8* 13.8*  HGB 13.5 12.5* 10.9*  HCT 41.6 38.8* 32.6*  MCV 95.2 93.5 93.9  PLT 209 227 878    Basic Metabolic Panel: Recent Labs  Lab 01/19/22 1947 01/21/22 0622 01/22/22 0255  NA 139 136 135  K 4.7 4.9 4.4  CL 104 102 103  CO2 28 26 24   GLUCOSE 96 120* 121*  BUN 27* 23 30*  CREATININE 1.33* 1.34* 1.51*  CALCIUM 9.3 8.9 8.3*    GFR: Estimated Creatinine Clearance: 47.3 mL/min (A) (by C-G formula based on SCr of 1.51 mg/dL (H)).  Liver Function Tests: Recent Labs  Lab 01/19/22 1947  AST 21   ALT 16  ALKPHOS 97  BILITOT 0.6  PROT 6.5  ALBUMIN 3.3*     Coagulation Profile: Recent Labs  Lab 01/19/22 1947  INR 1.1     Recent Results (from the past 240 hour(s))  Surgical PCR screen     Status: None   Collection Time: 01/19/22  7:00 PM   Specimen: Nasal Mucosa; Nasal Swab  Result Value Ref Range Status   MRSA, PCR NEGATIVE NEGATIVE Final   Staphylococcus aureus NEGATIVE NEGATIVE Final    Comment: (NOTE) The Xpert SA Assay (FDA approved for NASAL specimens in patients 46 years of age and older), is one component of a comprehensive surveillance program. It is not intended to diagnose infection nor to guide or monitor treatment. Performed at [facility], [place] [address], [place], [place] [address]   Resp  Panel by RT-PCR (Flu A&B, Covid) Nasopharyngeal Swab     Status: None   Collection Time: 01/20/22  7:30 AM   Specimen: Nasopharyngeal Swab; Nasopharyngeal(NP) swabs in vial transport medium  Result Value Ref Range Status   SARS Coronavirus 2 by RT PCR NEGATIVE NEGATIVE Final    Comment: (NOTE) SARS-CoV-2 target nucleic acids are NOT DETECTED.  The SARS-CoV-2 RNA is generally detectable in upper respiratory specimens during the acute phase of infection. The lowest concentration of SARS-CoV-2 viral copies this assay can detect is 138 copies/mL. A negative result does not preclude SARS-Cov-2 infection and should not be used as the sole basis for treatment or other patient management decisions. A negative result may occur with  improper specimen collection/handling, submission of specimen other than nasopharyngeal swab, presence of viral mutation(s) within the areas targeted by this assay, and inadequate number of viral copies(<138 copies/mL). A negative result must be combined with clinical observations, patient history, and epidemiological information. The expected result is Negative.  Fact Sheet for Patients:   EntrepreneurPulse.com.au  Fact Sheet for Healthcare Providers:  IncredibleEmployment.be  This test is no t yet approved or cleared by the [place] FDA and  has been authorized for detection and/or diagnosis of SARS-CoV-2 by FDA under an Emergency Use Authorization (EUA). This EUA will remain  in effect (meaning this test can be used) for the duration of the COVID-19 declaration under Section 564(b)(1) of the Act, 21 U.S.C.section 360bbb-3(b)(1), unless the authorization is terminated  or revoked sooner.       Influenza A by PCR NEGATIVE NEGATIVE Final   Influenza B by PCR NEGATIVE NEGATIVE Final    Comment: (NOTE) The Xpert Xpress SARS-CoV-2/FLU/RSV plus assay is intended as an aid in the diagnosis of influenza from Nasopharyngeal swab specimens and should not be used as a sole basis for treatment. Nasal washings and aspirates are unacceptable for Xpert Xpress SARS-CoV-2/FLU/RSV testing.  Fact Sheet for Patients: EntrepreneurPulse.com.au  Fact Sheet for Healthcare Providers: IncredibleEmployment.be  This test is not yet approved or cleared by the [place] FDA and has been authorized for detection and/or diagnosis of SARS-CoV-2 by FDA under an Emergency Use Authorization (EUA). This EUA will remain in effect (meaning this test can be used) for the duration of the COVID-19 declaration under Section 564(b)(1) of the Act, 21 U.S.C. section 360bbb-3(b)(1), unless the authorization is terminated or revoked.  Performed at [facility], [place] [address] [place], [place] [address]       Radiology Studies: DG HIP UNILAT WITH PELVIS 1V LEFT  Result Date: 01/20/2022 CLINICAL DATA:  Fracture proximal left femur EXAM: DG HIP (WITH OR WITHOUT PELVIS) 1V*L* COMPARISON:  01/19/2022 FINDINGS: There is interval left hip arthroplasty. No fracture lines are seen. Surgical clips are seen in the pelvis.  IMPRESSION: Interval left hip arthroplasty. Electronically Signed   By: Elmer Picker M.D.   On: 01/20/2022 12:32   DG HIP OPERATIVE UNILAT W OR W/O PELVIS LEFT  Result Date: 01/20/2022 CLINICAL DATA:  Postoperative LEFT hip arthroplasty. EXAM: OPERATIVE LEFT HIP (WITH PELVIS IF PERFORMED) 2 VIEWS TECHNIQUE: Fluoroscopic spot image(s) were submitted for interpretation post-operatively. COMPARISON:  Exam of earlier the same date. FINDINGS: Surgical clips are noted about the pelvis. Interval LEFT hip arthroplasty. Acetabular component appears well placed on the AP projection but cannot be well evaluated on the lateral projection due to artifact from adjacent support apparatus. Gas present in the soft tissues about the LEFT hip compatible with recent surgery. Femoral  component appears well seated without signs of acute process. Limited view of the pelvis excludes the upper iliac crests. Pelvis otherwise unremarkable. IMPRESSION: Interval LEFT hip arthroplasty as above. No immediate complications. Postoperative changes about the LEFT hip. Acetabular component not well assessed on cross-table lateral due to adjacent support devices. Electronically Signed   By: Zetta Bills M.D.   On: 01/20/2022 13:52       LOS: 3 days   [facility] Pager on [email or web address]  01/22/2022, 9:24 AM

## 2022-01-22 NOTE — Progress Notes (Signed)
*  PRELIMINARY RESULTS* Echocardiogram 2D Echocardiogram has been performed.  [name] 01/22/2022, 1:40 PM

## 2022-01-23 ENCOUNTER — Encounter: Payer: Medicare Other | Admitting: Physical Therapy

## 2022-01-23 LAB — BASIC METABOLIC PANEL
Anion gap: 8 (ref 5–15)
BUN: 29 mg/dL — ABNORMAL HIGH (ref 8–23)
CO2: 23 mmol/L (ref 22–32)
Calcium: 8 mg/dL — ABNORMAL LOW (ref 8.9–10.3)
Chloride: 101 mmol/L (ref 98–111)
Creatinine, Ser: 1.31 mg/dL — ABNORMAL HIGH (ref 0.61–1.24)
GFR, Estimated: 54 mL/min — ABNORMAL LOW (ref >60)
Glucose, Bld: 115 mg/dL — ABNORMAL HIGH (ref 70–99)
Potassium: 4.4 mmol/L (ref 3.5–5.1)
Sodium: 132 mmol/L — ABNORMAL LOW (ref 135–145)

## 2022-01-23 LAB — IRON AND TIBC
Iron: 16 ug/dL — ABNORMAL LOW (ref 45–182)
Saturation Ratios: 8 % — ABNORMAL LOW (ref 17.9–39.5)
TIBC: 197 ug/dL — ABNORMAL LOW (ref 250–450)
UIBC: 181 ug/dL

## 2022-01-23 LAB — RETICULOCYTES
Immature Retic Fract: 12.2 % (ref 2.3–15.9)
RBC.: 3.32 MIL/uL — ABNORMAL LOW (ref 4.22–5.81)
Retic Count, Absolute: 61.4 10*3/uL (ref 19.0–186.0)
Retic Ct Pct: 1.9 % (ref 0.4–3.1)

## 2022-01-23 LAB — CBC
HCT: 31.6 % — ABNORMAL LOW (ref 39.0–52.0)
Hemoglobin: 10.7 g/dL — ABNORMAL LOW (ref 13.0–17.0)
MCH: 32 pg (ref 26.0–34.0)
MCHC: 33.9 g/dL (ref 30.0–36.0)
MCV: 94.6 fL (ref 80.0–100.0)
Platelets: 157 10*3/uL (ref 150–400)
RBC: 3.34 MIL/uL — ABNORMAL LOW (ref 4.22–5.81)
RDW: 14.4 % (ref 11.5–15.5)
WBC: 12.9 10*3/uL — ABNORMAL HIGH (ref 4.0–10.5)
nRBC: 0 % (ref 0.0–0.2)

## 2022-01-23 LAB — FERRITIN: Ferritin: 416 ng/mL — ABNORMAL HIGH (ref 24–336)

## 2022-01-23 LAB — SURGICAL PATHOLOGY

## 2022-01-23 LAB — FOLATE: Folate: 19.8 ng/mL (ref >5.9)

## 2022-01-23 LAB — VITAMIN B12: Vitamin B-12: 366 pg/mL (ref 180–914)

## 2022-01-23 MED ORDER — POLYETHYLENE GLYCOL 3350 17 G PO PACK
17.0000 g | PACK | Freq: Every day | ORAL | Status: DC
  Administered 2022-01-23 – 2022-01-24 (×2): 17 g via ORAL
  Filled 2022-01-23 (×3): qty 1

## 2022-01-23 MED ORDER — SENNOSIDES-DOCUSATE SODIUM 8.6-50 MG PO TABS
2.0000 | ORAL_TABLET | Freq: Two times a day (BID) | ORAL | Status: DC
  Administered 2022-01-23 – 2022-01-24 (×4): 2 via ORAL
  Filled 2022-01-23 (×5): qty 2
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
## 2022-01-23 NOTE — Care Management Important Message (Signed)
Important Message  Patient Details  Name: [name] MRN: [id number] Date of Birth: [birth year]   Medicare Important Message Given:  Yes     Hannah Beat 01/23/2022, 11:47 AM

## 2022-01-23 NOTE — TOC Initial Note (Signed)
Transition of Care [facility]) - Initial/Assessment Note    Patient Details  Name: [name] MRN: [id number] Date of Birth: 07/20/1936  Transition of Care [facility]) CM/SW Contact:    Carl Chars, LCSW Phone Number: 01/23/2022, 12:23 PM  Clinical Narrative:    Pt oriented x1, CSW spoke with wife [name].  She is in agreement with recommendation for SNF, choice document provided, permission given to send out referral in hub with first choice being [facility].  Pt is vaccinated for covid with 3 boosters.  Referral sent out in hub.                Expected Discharge Plan: Skilled Nursing Facility Barriers to Discharge: SNF Pending bed offer   Patient Goals and CMS Choice   CMS Medicare.gov Compare Post Acute Care list provided to:: Patient Represenative (must comment) Choice offered to / list presented to : Spouse  Expected Discharge Plan and Services Expected Discharge Plan: [place] In-house Referral: Clinical Social Work   Post Acute Care Choice: [place] Living arrangements for the past 2 months: [place]                                      Prior Living Arrangements/Services Living arrangements for the past 2 months: Single Family Home Lives with:: Spouse Patient language and need for interpreter reviewed:: Yes        Need for Family Participation in Patient Care: Yes (Comment) Care giver support system in place?: Yes (comment) Current home services: Other (comment) (none) Criminal Activity/Legal Involvement Pertinent to Current Situation/Hospitalization: No - Comment as needed  Activities of Daily Living Home Assistive Devices/Equipment: Eyeglasses, Dentures (specify type), Bedside commode/3-in-1 ADL Screening (condition at time of admission) Patient's cognitive ability adequate to safely complete daily activities?: Yes Is the patient deaf or have difficulty hearing?: Yes Does the patient have difficulty seeing, even  when wearing glasses/contacts?: Yes Does the patient have difficulty concentrating, remembering, or making decisions?: No Patient able to express need for assistance with ADLs?: Yes Does the patient have difficulty dressing or bathing?: Yes Independently performs ADLs?: No Communication: Independent Dressing (OT): Needs assistance Is this a change from baseline?: Change from baseline, expected to last <3days Grooming: Needs assistance Is this a change from baseline?: Change from baseline, expected to last <3 days Bathing: Needs assistance Is this a change from baseline?: Change from baseline, expected to last <3 days In/Out Bed: Needs assistance Is this a change from baseline?: Change from baseline, expected to last <3 days Does the patient have difficulty walking or climbing stairs?: Yes Weakness of Legs: Both Weakness of Arms/Hands: Both  Permission Sought/Granted                  Emotional Assessment Appearance:: Appears stated age Attitude/Demeanor/Rapport: Unable to Assess Affect (typically observed): Unable to Assess Orientation: : Oriented to Self Alcohol / Substance Use: Not Applicable Psych Involvement: No (comment)  Admission diagnosis:  Closed displaced fracture of left femoral neck (HCC) [S72.002A] Patient Active Problem List   Diagnosis Date Noted   Normocytic anemia 01/22/2022   Elevated serum creatinine 01/20/2022   Closed displaced fracture of left femoral neck ([place]) 01/19/2022   Bilateral lower extremity edema 01/19/2022   Lumbar radiculopathy, chronic 01/27/2021   History of total replacement of left shoulder joint 12/19/2016   Primary osteoarthritis of both knees 12/15/2016   Chondromalacia patellae,  left knee 12/15/2016   Chondromalacia patellae, right knee 12/15/2016   History of amputation of right thumb 12/15/2016   History of gastroesophageal reflux (GERD) 12/15/2016   History of hyperlipidemia 12/15/2016   History of bladder cancer 12/15/2016    History of prostate cancer 12/15/2016   Former smoker 12/15/2016   Sepsis due to cellulitis ([place]) 12/24/2015   Acute dyspnea 12/24/2015   RVH (right ventricular hypertrophy) 12/24/2015   Acute renal failure ([place]) 12/24/2015   HLD (hyperlipidemia) 12/24/2015   Prolonged Q-T interval on ECG 12/24/2015   Primary localized osteoarthrosis, shoulder region 04/30/2013   PCP:  Carl Baton, MD Pharmacy:   [id number] - [place], [place] - [place] [address] Phone: [phone] Fax: [phone]     Social Determinants of Health (SDOH) Interventions    Readmission Risk Interventions No flowsheet data found.

## 2022-01-23 NOTE — Progress Notes (Signed)
Mobility Specialist: Progress Note   01/23/22 1250  Mobility  Bed Position Chair  Activity Transferred from bed to chair  Level of Assistance +2 (takes two people)  Assistive Device Stedy  Activity Response Tolerated fair  $Mobility charge 1 Mobility   Pt required modA to sit EOB and maxA to stand with significant bed elevation. Pt c/o LLE pain during transfer, no rating given. Pt is in the chair with call bell in reach and pt's wife present in the room. Chair alarm is on.   [facility] Mobility Specialist Mobility Specialist 5 North: [phone] Mobility Specialist 6 North: [phone]

## 2022-01-23 NOTE — NC FL2 (Signed)
[place] LEVEL OF CARE SCREENING TOOL     IDENTIFICATION  Patient Name: [name] Birthdate: 1936-04-16 Sex: male Admission Date (Current Location): 01/19/2022  [facility] and [place] Number:  Herbalist and Address:  [facility] [place], [place] [address], [place], [place] [address]      Provider Number: [id number]  Attending Physician Name and Address:  Bonnielee Haff, MD  Relative Name and Phone Number:  Abdullahi, [place] Spouse [phone]    Current Level of Care: Hospital Recommended Level of Care: [place] Prior Approval Number:    Date Approved/Denied:   PASRR Number: [id number]  Discharge Plan: SNF    Current Diagnoses: Patient Active Problem List   Diagnosis Date Noted   Normocytic anemia 01/22/2022   Elevated serum creatinine 01/20/2022   Closed displaced fracture of left femoral neck ([place]) 01/19/2022   Bilateral lower extremity edema 01/19/2022   Lumbar radiculopathy, chronic 01/27/2021   History of total replacement of left shoulder joint 12/19/2016   Primary osteoarthritis of both knees 12/15/2016   Chondromalacia patellae, left knee 12/15/2016   Chondromalacia patellae, right knee 12/15/2016   History of amputation of right thumb 12/15/2016   History of gastroesophageal reflux (GERD) 12/15/2016   History of hyperlipidemia 12/15/2016   History of bladder cancer 12/15/2016   History of prostate cancer 12/15/2016   Former smoker 12/15/2016   Sepsis due to cellulitis ([place]) 12/24/2015   Acute dyspnea 12/24/2015   RVH (right ventricular hypertrophy) 12/24/2015   Acute renal failure ([place]) 12/24/2015   HLD (hyperlipidemia) 12/24/2015   Prolonged Q-T interval on ECG 12/24/2015   Primary localized osteoarthrosis, shoulder region 04/30/2013    Orientation RESPIRATION BLADDER Height & Weight     Self  Normal Incontinent, External catheter Weight: 225 lb (102.1 kg) Height:  6\' 2"  (188 cm)  BEHAVIORAL  SYMPTOMS/MOOD NEUROLOGICAL BOWEL NUTRITION STATUS      Incontinent Diet (see discharge summary)  AMBULATORY STATUS COMMUNICATION OF NEEDS Skin   Total Care Verbally Surgical wounds                       Personal Care Assistance Level of Assistance  Bathing, Feeding, Dressing, Total care Bathing Assistance: Maximum assistance Feeding assistance: Limited assistance Dressing Assistance: Maximum assistance Total Care Assistance: Maximum assistance   Functional Limitations Info  Sight, Hearing, Speech Sight Info: Adequate Hearing Info: Adequate Speech Info: Adequate    SPECIAL CARE FACTORS FREQUENCY  PT (By licensed PT), OT (By licensed OT)     PT Frequency: 5x week OT Frequency: 5x week            Contractures Contractures Info: Not present    Additional Factors Info  Code Status, Allergies Code Status Info: DNR Allergies Info: NKA           Current Medications (01/23/2022):  This is the current hospital active medication list Current Facility-Administered Medications  Medication Dose Route Frequency Provider Last Rate Last Admin   acetaminophen (TYLENOL) tablet 650 mg  650 mg Oral Q6H PRN Willaim Sheng, MD       aspirin chewable tablet 81 mg  81 mg Oral Daily Pham, Minh Q, RPH-CPP   81 mg at 01/23/22 5732   cefadroxil (DURICEF) capsule 500 mg  500 mg Oral BID Willaim Sheng, MD   500 mg at 01/23/22 0952   donepezil (ARICEPT) tablet 5 mg  5 mg Oral QHS Bonnielee Haff, MD   5  mg at 01/22/22 2254   enoxaparin (LOVENOX) injection 40 mg  40 mg Subcutaneous Q24H Willaim Sheng, MD   40 mg at 01/23/22 5784   feeding supplement (ENSURE ENLIVE / ENSURE PLUS) liquid 237 mL  237 mL Oral TID BM Willaim Sheng, MD   237 mL at 01/23/22 1027   gabapentin (NEURONTIN) capsule 300 mg  300 mg Oral TID Bonnielee Haff, MD   300 mg at 01/23/22 6962   HYDROcodone-acetaminophen (NORCO/VICODIN) 5-325 MG per tablet 1-2 tablet  1-2 tablet Oral Q4H PRN Bonnielee Haff, MD   2 tablet at 01/23/22 1133   methocarbamol (ROBAXIN) 500 mg in dextrose 5 % 50 mL IVPB  500 mg Intravenous Q6H PRN Bonnielee Haff, MD       morphine (PF) 2 MG/ML injection 1-2 mg  1-2 mg Intravenous Q3H PRN Bonnielee Haff, MD       multivitamin with minerals tablet 1 tablet  1 tablet Oral Daily Willaim Sheng, MD   1 tablet at 01/23/22 0953   mupirocin ointment (BACTROBAN) 2 % 1 application  1 application Nasal BID Willaim Sheng, MD   1 application at 95/28/41 0953   polyethylene glycol (MIRALAX / GLYCOLAX) packet 17 g  17 g Oral Daily Bonnielee Haff, MD   17 g at 01/23/22 1012   senna-docusate (Senokot-S) tablet 2 tablet  2 tablet Oral BID Bonnielee Haff, MD   2 tablet at 01/23/22 1012   simvastatin (ZOCOR) tablet 40 mg  40 mg Oral Q breakfast Bonnielee Haff, MD   40 mg at 01/23/22 3244     Discharge Medications: Please see discharge summary for a list of discharge medications.  Relevant Imaging Results:  Relevant Lab Results:   Additional Information SSN: [id number].  Pt is vaccinated for covid with 3 boosters.  Joanne Chars, LCSW

## 2022-01-23 NOTE — Plan of Care (Signed)

## 2022-01-23 NOTE — Progress Notes (Addendum)
° °  ORTHOPAEDIC PROGRESS NOTE  s/p Procedure(s): ARTHROPLASTY BIPOLAR HIP (HEMIARTHROPLASTY) on 01/20/2022 with Dr. Zachery Dakins  SUBJECTIVE: Reports moderate pain about operative site. PT has been going slow. Needs significant assistance with mobility. Anticipate discharge to SNF.  OBJECTIVE: PE: General: resting in hospital bed, NAD LLE: incision CDI, leg lengths equal, intact EHL/TA/GSC, he endorses distal sensation, chronic lower extremity edema and stasis dermatitis- improved, warm well perfused foot   Vitals:   01/23/22 0744 01/23/22 1225  BP: 118/68 (!) 106/54  Pulse: 88 79  Resp: 17 18  Temp: 97.6 F (36.4 C) 99.1 F (37.3 C)  SpO2: 94% 98%   Stable post-op images.   ASSESSMENT: [name] is a 86 y.o. male POD#1  PLAN: Weightbearing: WBAT LLE Insicional and dressing care: Reinforce dressings as needed Orthopedic device(s): None Abx: cefadroxil 500 BID x7 days Showering: Hold for now VTE prophylaxis: Lovenox 40mg  qd  Pain control: PRN pain medications, minimize narcotics as able Follow - up plan: 2 weeks in office with Dr. Zachery Dakins Dispo: PT/OT evaluations pending. Likely SNF.  Contact information:  After hours and holidays please check [email or web address] for group call information for [facility]  Charlies Constable, MD Orthopaedic Surgery

## 2022-01-23 NOTE — Progress Notes (Signed)
[facility] PROGRESS NOTE   [name] NWG:[id number] DOB: 01-08-1936 DOA: 01/19/2022  4 DOS: the patient was seen and examined on 01/23/2022  PCP: Shon Baton, MD  Brief History and Hospital Course:  86 y.o. male with medical history significant of lumbosacral radiculopathy, remote history of bladder and prostate cancer followed by urology at [facility], hyperlipidemia, lymphedema presented to [facility] orthopedics office with left hip pain and difficulty ambulating.  Imaging revealed displaced left femoral neck fracture.  He was hospitalized for further management.  Orthopedics was consulted.  Underwent surgery on 01/20/2022.  Stable for the most part.  Waiting on skilled nursing facility.  Consultants: Orthopedics  Procedures:   Left hip arthroplasty 2/3  Lower extremity Doppler studies    Subjective: Patient is sleepy but easily arousable.  Denies any complaints at this time.  No chest pain shortness of breath.  Pain in the left lower extremity has improved.     Assessment/Plan:  * Closed displaced fracture of left femoral neck ([place])- (present on admission) Patient was seen by orthopedics.  Underwent surgery on 01/20/2022.  Pain is well controlled.  Seen by PT and OT.  SNF is recommended.  Bilateral lower extremity edema- (present on admission) Patient has chronic lower extremity edema.  He has a history of lymphedema.  Lower extremity Doppler studies without DVT.  Compression stockings.  Keep lower extremities elevated.   BNP was normal.  Echocardiogram shows normal systolic function. Grade 1 diastolic dysfunction is noted.  No significant valvular abnormalities noted. He was noted to have low-grade fever with elevated WBCs raising concern for possible infection.  Noted to be on cefadroxil per orthopedics.. WBC appears to be improving. Continue to monitor.  No obvious source of infection identified otherwise.  Concern was for cellulitis of his lower extremities but those  changes appear to be chronic.  Normocytic anemia Drop in hemoglobin is likely due to dilution as well as recent surgery.  Hemoglobin stable this morning. Anemia panel reviewed.  No clear-cut deficiencies identified.   Elevated serum creatinine- (present on admission) Noted to have creatinine of 1.33 at admission.  Old labs reviewed.  Creatinine was 1.46 in February 2022.  Quite possible he has an element of chronic disease but unknown stage at this time.  Will need to be worked up in the outpatient setting.   Mildly increased BUN and creatinine noted yesterday.  He was given IV fluids with improvement.  Encourage oral intake.  Avoid nephrotoxic agents.  Stop IV fluids.  History of bladder cancer History of prostate cancer  Remote history of bladder and prostate cancer currently not on treatment. -Outpatient urology follow-up  HLD (hyperlipidemia)- (present on admission) Continue statin.      DVT Prophylaxis: Lovenox Code Status: DNR Family Communication: Discussed with patient.  No family at bedside Disposition Plan: SNF  Status is: Inpatient Remains inpatient appropriate because: Left hip fracture.       Medications: Scheduled:  aspirin  81 mg Oral Daily   cefadroxil  500 mg Oral BID   donepezil  5 mg Oral QHS   enoxaparin  40 mg Subcutaneous Q24H   feeding supplement  237 mL Oral TID BM   gabapentin  300 mg Oral TID   multivitamin with minerals  1 tablet Oral Daily   mupirocin ointment  1 application Nasal BID   simvastatin  40 mg Oral Q breakfast   Continuous:  methocarbamol (ROBAXIN) IV     VHQ:IONGEXBMWUXLK, HYDROcodone-acetaminophen, methocarbamol (ROBAXIN) IV, morphine injection,  polyethylene glycol, senna-docusate  Antibiotics: Anti-infectives (From admission, onward)    Start     Dose/Rate Route Frequency Ordered Stop   01/21/22 2100  cefadroxil (DURICEF) capsule 500 mg        500 mg Oral 2 times daily 01/20/22 1520 01/28/22 2159   01/20/22 1615   ceFAZolin (ANCEF) IVPB 2g/100 mL premix        2 g 200 mL/hr over 30 Minutes Intravenous Every 8 hours 01/20/22 1520 01/20/22 2236   01/20/22 0900  ceFAZolin (ANCEF) IVPB 2g/100 mL premix        2 g 200 mL/hr over 30 Minutes Intravenous On call to O.R. 01/20/22 0409 01/20/22 1130       Objective:  Vital Signs  Vitals:   01/22/22 1344 01/22/22 1951 01/23/22 0430 01/23/22 0744  BP: (!) 117/58 123/68 111/68 118/68  Pulse: 97 (!) 109 88 88  Resp: 16 17 16 17   Temp: 99.2 F (37.3 C) 99.2 F (37.3 C) 98.3 F (36.8 C) 97.6 F (36.4 C)  TempSrc: Oral  Oral Oral  SpO2: 97% 94% 95% 94%  Weight:      Height:        Intake/Output Summary (Last 24 hours) at 01/23/2022 0931 Last data filed at 01/23/2022 0900 Gross per 24 hour  Intake 824.5 ml  Output 1340 ml  Net -515.5 ml    Filed Weights   01/20/22 0939  Weight: 102.1 kg    General appearance: Awake alert.  In no distress Resp: Clear to auscultation bilaterally.  Normal effort Cardio: S1-S2 is normal regular.  No S3-S4.  No rubs murmurs or bruit GI: Abdomen is soft.  Nontender nondistended.  Bowel sounds are present normal.  No masses organomegaly Extremities: Compression stockings over both lower extremities Neurologic:  No focal neurological deficits.     Lab Results:  Data Reviewed: I have personally reviewed labs and imaging study reports  CBC: Recent Labs  Lab 01/19/22 1947 01/21/22 0622 01/22/22 0255 01/23/22 0354  WBC 7.7 16.8* 13.8* 12.9*  HGB 13.5 12.5* 10.9* 10.7*  HCT 41.6 38.8* 32.6* 31.6*  MCV 95.2 93.5 93.9 94.6  PLT 209 227 166 157     Basic Metabolic Panel: Recent Labs  Lab 01/19/22 1947 01/21/22 0622 01/22/22 0255 01/23/22 0354  NA 139 136 135 132*  K 4.7 4.9 4.4 4.4  CL 104 102 103 101  CO2 28 26 24 23   GLUCOSE 96 120* 121* 115*  BUN 27* 23 30* 29*  CREATININE 1.33* 1.34* 1.51* 1.31*  CALCIUM 9.3 8.9 8.3* 8.0*     GFR: Estimated Creatinine Clearance: 54.5 mL/min (A) (by C-G  formula based on SCr of 1.31 mg/dL (H)).  Liver Function Tests: Recent Labs  Lab 01/19/22 1947  AST 21  ALT 16  ALKPHOS 97  BILITOT 0.6  PROT 6.5  ALBUMIN 3.3*      Coagulation Profile: Recent Labs  Lab 01/19/22 1947  INR 1.1      Recent Results (from the past 240 hour(s))  Surgical PCR screen     Status: None   Collection Time: 01/19/22  7:00 PM   Specimen: Nasal Mucosa; Nasal Swab  Result Value Ref Range Status   MRSA, PCR NEGATIVE NEGATIVE Final   Staphylococcus aureus NEGATIVE NEGATIVE Final    Comment: (NOTE) The Xpert SA Assay (FDA approved for NASAL specimens in patients 77 years of age and older), is one component of a comprehensive surveillance program. It is not intended to diagnose infection nor  to guide or monitor treatment. Performed at [facility], [place] [address] [place], [place] [address]   Resp Panel by RT-PCR (Flu A&B, Covid) Nasopharyngeal Swab     Status: None   Collection Time: 01/20/22  7:30 AM   Specimen: Nasopharyngeal Swab; Nasopharyngeal(NP) swabs in vial transport medium  Result Value Ref Range Status   SARS Coronavirus 2 by RT PCR NEGATIVE NEGATIVE Final    Comment: (NOTE) SARS-CoV-2 target nucleic acids are NOT DETECTED.  The SARS-CoV-2 RNA is generally detectable in upper respiratory specimens during the acute phase of infection. The lowest concentration of SARS-CoV-2 viral copies this assay can detect is 138 copies/mL. A negative result does not preclude SARS-Cov-2 infection and should not be used as the sole basis for treatment or other patient management decisions. A negative result may occur with  improper specimen collection/handling, submission of specimen other than nasopharyngeal swab, presence of viral mutation(s) within the areas targeted by this assay, and inadequate number of viral copies(<138 copies/mL). A negative result must be combined with clinical observations, patient history, and  epidemiological information. The expected result is Negative.  Fact Sheet for Patients:  EntrepreneurPulse.com.au  Fact Sheet for Healthcare Providers:  IncredibleEmployment.be  This test is no t yet approved or cleared by the [place] FDA and  has been authorized for detection and/or diagnosis of SARS-CoV-2 by FDA under an Emergency Use Authorization (EUA). This EUA will remain  in effect (meaning this test can be used) for the duration of the COVID-19 declaration under Section 564(b)(1) of the Act, 21 U.S.C.section 360bbb-3(b)(1), unless the authorization is terminated  or revoked sooner.       Influenza A by PCR NEGATIVE NEGATIVE Final   Influenza B by PCR NEGATIVE NEGATIVE Final    Comment: (NOTE) The Xpert Xpress SARS-CoV-2/FLU/RSV plus assay is intended as an aid in the diagnosis of influenza from Nasopharyngeal swab specimens and should not be used as a sole basis for treatment. Nasal washings and aspirates are unacceptable for Xpert Xpress SARS-CoV-2/FLU/RSV testing.  Fact Sheet for Patients: EntrepreneurPulse.com.au  Fact Sheet for Healthcare Providers: IncredibleEmployment.be  This test is not yet approved or cleared by the [place] FDA and has been authorized for detection and/or diagnosis of SARS-CoV-2 by FDA under an Emergency Use Authorization (EUA). This EUA will remain in effect (meaning this test can be used) for the duration of the COVID-19 declaration under Section 564(b)(1) of the Act, 21 U.S.C. section 360bbb-3(b)(1), unless the authorization is terminated or revoked.  Performed at [facility], [place] [address] [place],  [address]        Radiology Studies: ECHOCARDIOGRAM COMPLETE  Result Date: 01/22/2022    ECHOCARDIOGRAM REPORT   Patient Name:   [name] Date of Exam: 01/22/2022 Medical Rec #:  [id number]      Height:       74.0 in Accession #:     [id number]     Weight:       225.0 lb Date of Birth:  05-Feb-1936      BSA:          2.285 m Patient Age:    65 years       BP:           103/72 mmHg Patient Gender: M              HR:           92 bpm. Exam Location:  [facility] Procedure: 2D  Echo, Cardiac Doppler and Color Doppler Indications:    CHF  History:        Patient has prior history of Echocardiogram examinations, most                 recent 12/25/2015. Signs/Symptoms:Dyspnea; Risk                 Factors:Dyslipidemia and Former Smoker. RT femoral fx.  Sonographer:    Wenda Low Referring Phys: [id number] DANIEL A Elbert  1. Very limited windows. EF appears normal. 60-65%. Left ventricular ejection fraction, by estimation, is 60 to 65%. The left ventricle has normal function. The left ventricle has no regional wall motion abnormalities. Left ventricular diastolic parameters are consistent with Grade I diastolic dysfunction (impaired relaxation).  2. Right ventricular systolic function is normal. The right ventricular size is normal. Tricuspid regurgitation signal is inadequate for assessing PA pressure.  3. The mitral valve was not well visualized.  4. The aortic valve was not well visualized. Aortic valve regurgitation is not visualized. No aortic stenosis is present.  5. The inferior vena cava is dilated in size with >50% respiratory variability, suggesting right atrial pressure of 8 mmHg. Comparison(s): No significant change from prior study. Conclusion(s)/Recommendation(s): Otherwise normal echocardiogram, with minor abnormalities described in the report. FINDINGS  Left Ventricle: Very limited windows. EF appears normal. 60-65%. Left ventricular ejection fraction, by estimation, is 60 to 65%. The left ventricle has normal function. The left ventricle has no regional wall motion abnormalities. The left ventricular internal cavity size was normal in size. Left ventricular diastolic parameters are consistent with Grade I diastolic  dysfunction (impaired relaxation). Right Ventricle: The right ventricular size is normal. No increase in right ventricular wall thickness. Right ventricular systolic function is normal. Tricuspid regurgitation signal is inadequate for assessing PA pressure. Left Atrium: Left atrial size was normal in size. Right Atrium: Right atrial size was normal in size. Pericardium: There is no evidence of pericardial effusion. Mitral Valve: The mitral valve was not well visualized. MV peak gradient, 4.1 mmHg. The mean mitral valve gradient is 2.5 mmHg. Tricuspid Valve: The tricuspid valve is not well visualized. Tricuspid valve regurgitation is not demonstrated. Aortic Valve: The aortic valve was not well visualized. Aortic valve regurgitation is not visualized. No aortic stenosis is present. Aortic valve mean gradient measures 12.0 mmHg. Aortic valve peak gradient measures 23.5 mmHg. Aortic valve area, by VTI measures 1.76 cm. Pulmonic Valve: The pulmonic valve was not well visualized. Pulmonic valve regurgitation is not visualized. Aorta: The aortic root and ascending aorta are structurally normal, with no evidence of dilitation. Venous: The inferior vena cava is dilated in size with greater than 50% respiratory variability, suggesting right atrial pressure of 8 mmHg. IAS/Shunts: The interatrial septum was not well visualized.  LEFT VENTRICLE PLAX 2D LVIDd:         4.30 cm LVIDs:         2.50 cm LV PW:         1.20 cm LV IVS:        1.30 cm LVOT diam:     2.00 cm LV SV:         81 LV SV Index:   35 LVOT Area:     3.14 cm  LV Volumes (MOD) LV vol d, MOD A2C: 52.2 ml LV vol d, MOD A4C: 61.4 ml LV vol s, MOD A2C: 18.1 ml LV vol s, MOD A4C: 23.6 ml LV SV MOD A2C:  34.1 ml LV SV MOD A4C:     61.4 ml LV SV MOD BP:      39.4 ml RIGHT VENTRICLE RV Basal diam:  3.55 cm RV Mid diam:    2.70 cm RV S prime:     11.50 cm/s TAPSE (M-mode): 2.7 cm LEFT ATRIUM             Index        RIGHT ATRIUM           Index LA diam:        3.80 cm  1.66 cm/m   RA Area:     15.50 cm LA Vol (A2C):   61.8 ml 27.05 ml/m  RA Volume:   41.30 ml  18.07 ml/m LA Vol (A4C):   59.4 ml 26.00 ml/m LA Biplane Vol: 62.8 ml 27.48 ml/m  AORTIC VALVE AV Area (Vmax):    1.65 cm AV Area (Vmean):   1.71 cm AV Area (VTI):     1.76 cm AV Vmax:           242.50 cm/s AV Vmean:          158.500 cm/s AV VTI:            0.460 m AV Peak Grad:      23.5 mmHg AV Mean Grad:      12.0 mmHg LVOT Vmax:         127.00 cm/s LVOT Vmean:        86.500 cm/s LVOT VTI:          0.258 m LVOT/AV VTI ratio: 0.56  AORTA Ao Root diam: 3.40 cm MITRAL VALVE MV Area (PHT): 5.02 cm     SHUNTS MV Area VTI:   3.63 cm     Systemic VTI:  0.26 m MV Peak grad:  4.1 mmHg     Systemic Diam: 2.00 cm MV Mean grad:  2.5 mmHg MV Vmax:       1.02 m/s MV Vmean:      68.0 cm/s MV Decel Time: 151 msec MV E velocity: 85.30 cm/s MV A velocity: 112.00 cm/s MV E/A ratio:  0.76 Mary Scientist, physiological signed by Phineas Inches Signature Date/Time: 01/22/2022/1:55:37 PM    Final        LOS: 4 days   Bonnielee Haff  [facility] Pager on [email or web address]  01/23/2022, 9:31 AM

## 2022-01-24 ENCOUNTER — Encounter (HOSPITAL_COMMUNITY): Payer: Self-pay | Admitting: Orthopedic Surgery

## 2022-01-24 LAB — BASIC METABOLIC PANEL
Anion gap: 9 (ref 5–15)
BUN: 30 mg/dL — ABNORMAL HIGH (ref 8–23)
CO2: 24 mmol/L (ref 22–32)
Calcium: 8.5 mg/dL — ABNORMAL LOW (ref 8.9–10.3)
Chloride: 105 mmol/L (ref 98–111)
Creatinine, Ser: 1.23 mg/dL (ref 0.61–1.24)
GFR, Estimated: 58 mL/min — ABNORMAL LOW (ref >60)
Glucose, Bld: 123 mg/dL — ABNORMAL HIGH (ref 70–99)
Potassium: 4.6 mmol/L (ref 3.5–5.1)
Sodium: 138 mmol/L (ref 135–145)

## 2022-01-24 LAB — CBC
HCT: 31.8 % — ABNORMAL LOW (ref 39.0–52.0)
Hemoglobin: 10.7 g/dL — ABNORMAL LOW (ref 13.0–17.0)
MCH: 31.4 pg (ref 26.0–34.0)
MCHC: 33.6 g/dL (ref 30.0–36.0)
MCV: 93.3 fL (ref 80.0–100.0)
Platelets: 205 10*3/uL (ref 150–400)
RBC: 3.41 MIL/uL — ABNORMAL LOW (ref 4.22–5.81)
RDW: 14.1 % (ref 11.5–15.5)
WBC: 12.2 10*3/uL — ABNORMAL HIGH (ref 4.0–10.5)
nRBC: 0 % (ref 0.0–0.2)

## 2022-01-24 LAB — RESP PANEL BY RT-PCR (FLU A&B, COVID) ARPGX2
Influenza A by PCR: NEGATIVE
Influenza B by PCR: NEGATIVE
SARS Coronavirus 2 by RT PCR: NEGATIVE

## 2022-01-24 MED ORDER — POLYETHYLENE GLYCOL 3350 17 G PO PACK: 17.0000 g | PACK | Freq: Every day | ORAL | 0 refills | Status: AC

## 2022-01-24 MED ORDER — CEFADROXIL 500 MG PO CAPS: 500.0000 mg | ORAL_CAPSULE | Freq: Two times a day (BID) | ORAL | Status: AC

## 2022-01-24 MED ORDER — ENOXAPARIN SODIUM 40 MG/0.4ML IJ SOSY: 40.0000 mg | PREFILLED_SYRINGE | INTRAMUSCULAR | Status: AC

## 2022-01-24 MED ORDER — SENNOSIDES-DOCUSATE SODIUM 8.6-50 MG PO TABS: 2.0000 | ORAL_TABLET | Freq: Two times a day (BID) | ORAL | Status: AC

## 2022-01-24 MED ORDER — ENSURE ENLIVE PO LIQD: 237.0000 mL | Freq: Three times a day (TID) | ORAL | 12 refills | Status: AC

## 2022-01-24 MED ORDER — METHOCARBAMOL 500 MG PO TABS: 500.0000 mg | ORAL_TABLET | Freq: Four times a day (QID) | ORAL | Status: DC

## 2022-01-24 MED ORDER — HYDROCODONE-ACETAMINOPHEN 5-325 MG PO TABS
1.0000 | ORAL_TABLET | ORAL | 0 refills | Status: DC | PRN
Start: 2022-01-24 — End: 2022-02-08

## 2022-01-24 NOTE — Plan of Care (Signed)

## 2022-01-24 NOTE — TOC Progression Note (Addendum)
Transition of Care [facility]) - Progression Note    Patient Details  Name: [name] MRN: [id number] Date of Birth: 1936-06-21  Transition of Care [facility]) CM/SW Contact  Joanne Chars, LCSW Phone Number: 01/24/2022, 10:35 AM  Clinical Narrative:   Pt has multiple bed offers: [facility] does offer, [facility] is full.  CSW LM with wife to confirm that she wants to accept [facility] offer.   1100: CSW spoke with wife, who confirms. Auth submitted in [place] and approved: [id number].  3 days: 2/7-2/9.  1325: [facility] can accept pt tomorrow. Pt and wife informed, as was MD.   Expected Discharge Plan: Skilled Nursing Facility Barriers to Discharge: SNF Pending bed offer  Expected Discharge Plan and Services Expected Discharge Plan: [place] In-house Referral: Clinical Social Work   Post Acute Care Choice: [place] Living arrangements for the past 2 months: Single Family Home Expected Discharge Date: 01/24/22                                     Social Determinants of Health (SDOH) Interventions    Readmission Risk Interventions No flowsheet data found.

## 2022-01-24 NOTE — Discharge Summary (Signed)
[facility]  Physician Discharge Summary   Patient ID: [name] MRN: [id number] DOB/AGE: 21-Feb-1936 86 y.o.  Admit date: 01/19/2022 Discharge date:   01/24/2022   PCP: Shon Baton, MD  DISCHARGE DIAGNOSES:  Principal Problem:   Closed displaced fracture of left femoral neck ([place]) Active Problems:   Bilateral lower extremity edema   History of bladder cancer   Elevated serum creatinine   Normocytic anemia   HLD (hyperlipidemia)   RECOMMENDATIONS FOR OUTPATIENT FOLLOW UP: Please check CBC and basic metabolic panel early next week Follow-up with orthopedics as recommended   Home Health: SNF Equipment/Devices: None  CODE STATUS: DNR  DISCHARGE CONDITION: fair  Diet recommendation: Regular as tolerated  INITIAL HISTORY: 86 y.o. male with medical history significant of lumbosacral radiculopathy, remote history of bladder and prostate cancer followed by urology at [facility], hyperlipidemia, lymphedema presented to [facility] orthopedics office with left hip pain and difficulty ambulating.  Imaging revealed displaced left femoral neck fracture.  He was hospitalized for further management.  Orthopedics was consulted.  Underwent surgery on 01/20/2022.  Stable for the most part.    Consultants: Orthopedics   Procedures:    Left hip arthroplasty 2/3   Lower extremity Doppler studies   HOSPITAL COURSE:    * Closed displaced fracture of left femoral neck ([place])- (present on admission) Patient was seen by orthopedics.  Underwent surgery on 01/20/2022.  Pain is well controlled.  Seen by PT and OT.  SNF is recommended.  Bilateral lower extremity edema- (present on admission) Patient has chronic lower extremity edema.  He has a history of lymphedema.  Lower extremity Doppler studies without DVT.   BNP was normal.  Echocardiogram shows normal systolic function. Grade 1 diastolic dysfunction is noted.  No significant valvular abnormalities noted. He was noted to have  low-grade fever with elevated WBCs. Noted to be on cefadroxil per orthopedics.. WBC appears to be improving. Continue to monitor.  No obvious source of infection identified otherwise.  Concern was for cellulitis of his lower extremities but those changes appear to be chronic. Continue compression stockings and keep legs elevated as much as possible  Normocytic anemia Drop in hemoglobin is likely due to dilution as well as recent surgery.  Anemia panel reviewed.  No clear-cut deficiencies identified.  Hemoglobin is stable.  Elevated serum creatinine- (present on admission) Noted to have creatinine of 1.33 at admission.  Old labs reviewed.  Creatinine was 1.46 in February 2022.  Quite possible he has an element of chronic disease but unknown stage at this time.  Will need to be worked up in the outpatient setting.   Renal function fluctuated here in the hospital.  Given gentle hydration for a few hours.  Noted to be better today.  Avoid nephrotoxic agents.  History of bladder cancer History of prostate cancer  Remote history of bladder and prostate cancer currently not on treatment. -Outpatient urology follow-up  HLD (hyperlipidemia)- (present on admission) Continue statin.   Patient is stable.  Okay for discharge to SNF when bed is available.   PERTINENT LABS:  The results of significant diagnostics from this hospitalization (including imaging, microbiology, ancillary and laboratory) are listed below for reference.    Microbiology: Recent Results (from the past 240 hour(s))  Surgical PCR screen     Status: None   Collection Time: 01/19/22  7:00 PM   Specimen: Nasal Mucosa; Nasal Swab  Result Value Ref Range Status   MRSA, PCR NEGATIVE NEGATIVE Final   Staphylococcus aureus  NEGATIVE NEGATIVE Final    Comment: (NOTE) The Xpert SA Assay (FDA approved for NASAL specimens in patients 68 years of age and older), is one component of a comprehensive surveillance program. It is not  intended to diagnose infection nor to guide or monitor treatment. Performed at [facility], [place] [address] [place], [place] [address]   Resp Panel by RT-PCR (Flu A&B, Covid) Nasopharyngeal Swab     Status: None   Collection Time: 01/20/22  7:30 AM   Specimen: Nasopharyngeal Swab; Nasopharyngeal(NP) swabs in vial transport medium  Result Value Ref Range Status   SARS Coronavirus 2 by RT PCR NEGATIVE NEGATIVE Final    Comment: (NOTE) SARS-CoV-2 target nucleic acids are NOT DETECTED.  The SARS-CoV-2 RNA is generally detectable in upper respiratory specimens during the acute phase of infection. The lowest concentration of SARS-CoV-2 viral copies this assay can detect is 138 copies/mL. A negative result does not preclude SARS-Cov-2 infection and should not be used as the sole basis for treatment or other patient management decisions. A negative result may occur with  improper specimen collection/handling, submission of specimen other than nasopharyngeal swab, presence of viral mutation(s) within the areas targeted by this assay, and inadequate number of viral copies(<138 copies/mL). A negative result must be combined with clinical observations, patient history, and epidemiological information. The expected result is Negative.  Fact Sheet for Patients:  EntrepreneurPulse.com.au  Fact Sheet for Healthcare Providers:  IncredibleEmployment.be  This test is no t yet approved or cleared by the [place] FDA and  has been authorized for detection and/or diagnosis of SARS-CoV-2 by FDA under an Emergency Use Authorization (EUA). This EUA will remain  in effect (meaning this test can be used) for the duration of the COVID-19 declaration under Section 564(b)(1) of the Act, 21 U.S.C.section 360bbb-3(b)(1), unless the authorization is terminated  or revoked sooner.       Influenza A by PCR NEGATIVE NEGATIVE Final   Influenza B by PCR NEGATIVE  NEGATIVE Final    Comment: (NOTE) The Xpert Xpress SARS-CoV-2/FLU/RSV plus assay is intended as an aid in the diagnosis of influenza from Nasopharyngeal swab specimens and should not be used as a sole basis for treatment. Nasal washings and aspirates are unacceptable for Xpert Xpress SARS-CoV-2/FLU/RSV testing.  Fact Sheet for Patients: EntrepreneurPulse.com.au  Fact Sheet for Healthcare Providers: IncredibleEmployment.be  This test is not yet approved or cleared by the [place] FDA and has been authorized for detection and/or diagnosis of SARS-CoV-2 by FDA under an Emergency Use Authorization (EUA). This EUA will remain in effect (meaning this test can be used) for the duration of the COVID-19 declaration under Section 564(b)(1) of the Act, 21 U.S.C. section 360bbb-3(b)(1), unless the authorization is terminated or revoked.  Performed at [facility], [place] [address] [place], [place] [address]      Labs:  COVID-19 Labs  Recent Labs    01/23/22 0354  FERRITIN 416*    Lab Results  Component Value Date   SARSCOV2NAA NEGATIVE 01/20/2022   [place] NEGATIVE 01/25/2021      Basic Metabolic Panel: Recent Labs  Lab 01/19/22 1947 01/21/22 0622 01/22/22 0255 01/23/22 0354 01/24/22 0422  NA 139 136 135 132* 138  K 4.7 4.9 4.4 4.4 4.6  CL 104 102 103 101 105  CO2 28 26 24 23 24   GLUCOSE 96 120* 121* 115* 123*  BUN 27* 23 30* 29* 30*  CREATININE 1.33* 1.34* 1.51* 1.31* 1.23  CALCIUM 9.3 8.9  8.3* 8.0* 8.5*   Liver Function Tests: Recent Labs  Lab 01/19/22 1947  AST 21  ALT 16  ALKPHOS 97  BILITOT 0.6  PROT 6.5  ALBUMIN 3.3*    CBC: Recent Labs  Lab 01/19/22 1947 01/21/22 0622 01/22/22 0255 01/23/22 0354 01/24/22 0422  WBC 7.7 16.8* 13.8* 12.9* 12.2*  HGB 13.5 12.5* 10.9* 10.7* 10.7*  HCT 41.6 38.8* 32.6* 31.6* 31.8*  MCV 95.2 93.5 93.9 94.6 93.3  PLT 209 227 166 157 205    BNP: BNP (last 3  results) Recent Labs    01/20/22 0222  BNP 92.3     IMAGING STUDIES DG Pelvis 1-2 Views  Result Date: 01/19/2022 CLINICAL DATA:  Known left hip fracture EXAM: PELVIS - 1 VIEW COMPARISON:  Films from earlier in the same day. FINDINGS: Pelvic ring is intact. Postsurgical changes in lumbar spine and pelvis are seen. Left subcapital femoral neck fracture is noted with impaction at the fracture site. No other focal abnormality is noted. IMPRESSION: Left femoral neck fracture with impaction. Electronically Signed   By: Inez Catalina M.D.   On: 01/19/2022 20:45   Chest Portable 1 View  Result Date: 01/19/2022 CLINICAL DATA:  Preop evaluation, known history of femoral neck fracture EXAM: PORTABLE CHEST 1 VIEW COMPARISON:  12/24/2015 FINDINGS: Cardiac shadow is within normal limits. Postsurgical changes in the left chest wall and left shoulder are seen. No acute bony abnormality is noted. The lungs are clear bilaterally. IMPRESSION: No acute abnormality noted. Electronically Signed   By: Inez Catalina M.D.   On: 01/19/2022 20:41   ECHOCARDIOGRAM COMPLETE  Result Date: 01/22/2022    ECHOCARDIOGRAM REPORT   Patient Name:   [name] Date of Exam: 01/22/2022 Medical Rec #:  [id number]      Height:       74.0 in Accession #:    [id number]     Weight:       225.0 lb Date of Birth:  September 05, 1936      BSA:          2.285 m Patient Age:    66 years       BP:           103/72 mmHg Patient Gender: M              HR:           92 bpm. Exam Location:  [facility] Procedure: 2D Echo, Cardiac Doppler and Color Doppler Indications:    CHF  History:        Patient has prior history of Echocardiogram examinations, most                 recent 12/25/2015. Signs/Symptoms:Dyspnea; Risk                 Factors:Dyslipidemia and Former Smoker. RT femoral fx.  Sonographer:    Wenda Low Referring Phys: [id number] DANIEL A [place]  1. Very limited windows. EF appears normal. 60-65%. Left ventricular ejection fraction, by  estimation, is 60 to 65%. The left ventricle has normal function. The left ventricle has no regional wall motion abnormalities. Left ventricular diastolic parameters are consistent with Grade I diastolic dysfunction (impaired relaxation).  2. Right ventricular systolic function is normal. The right ventricular size is normal. Tricuspid regurgitation signal is inadequate for assessing PA pressure.  3. The mitral valve was not well visualized.  4. The aortic valve was not well visualized. Aortic valve regurgitation is not visualized. No  aortic stenosis is present.  5. The inferior vena cava is dilated in size with >50% respiratory variability, suggesting right atrial pressure of 8 mmHg. Comparison(s): No significant change from prior study. Conclusion(s)/Recommendation(s): Otherwise normal echocardiogram, with minor abnormalities described in the report. FINDINGS  Left Ventricle: Very limited windows. EF appears normal. 60-65%. Left ventricular ejection fraction, by estimation, is 60 to 65%. The left ventricle has normal function. The left ventricle has no regional wall motion abnormalities. The left ventricular internal cavity size was normal in size. Left ventricular diastolic parameters are consistent with Grade I diastolic dysfunction (impaired relaxation). Right Ventricle: The right ventricular size is normal. No increase in right ventricular wall thickness. Right ventricular systolic function is normal. Tricuspid regurgitation signal is inadequate for assessing PA pressure. Left Atrium: Left atrial size was normal in size. Right Atrium: Right atrial size was normal in size. Pericardium: There is no evidence of pericardial effusion. Mitral Valve: The mitral valve was not well visualized. MV peak gradient, 4.1 mmHg. The mean mitral valve gradient is 2.5 mmHg. Tricuspid Valve: The tricuspid valve is not well visualized. Tricuspid valve regurgitation is not demonstrated. Aortic Valve: The aortic valve was not well  visualized. Aortic valve regurgitation is not visualized. No aortic stenosis is present. Aortic valve mean gradient measures 12.0 mmHg. Aortic valve peak gradient measures 23.5 mmHg. Aortic valve area, by VTI measures 1.76 cm. Pulmonic Valve: The pulmonic valve was not well visualized. Pulmonic valve regurgitation is not visualized. Aorta: The aortic root and ascending aorta are structurally normal, with no evidence of dilitation. Venous: The inferior vena cava is dilated in size with greater than 50% respiratory variability, suggesting right atrial pressure of 8 mmHg. IAS/Shunts: The interatrial septum was not well visualized.  LEFT VENTRICLE PLAX 2D LVIDd:         4.30 cm LVIDs:         2.50 cm LV PW:         1.20 cm LV IVS:        1.30 cm LVOT diam:     2.00 cm LV SV:         81 LV SV Index:   35 LVOT Area:     3.14 cm  LV Volumes (MOD) LV vol d, MOD A2C: 52.2 ml LV vol d, MOD A4C: 61.4 ml LV vol s, MOD A2C: 18.1 ml LV vol s, MOD A4C: 23.6 ml LV SV MOD A2C:     34.1 ml LV SV MOD A4C:     61.4 ml LV SV MOD BP:      39.4 ml RIGHT VENTRICLE RV Basal diam:  3.55 cm RV Mid diam:    2.70 cm RV S prime:     11.50 cm/s TAPSE (M-mode): 2.7 cm LEFT ATRIUM             Index        RIGHT ATRIUM           Index LA diam:        3.80 cm 1.66 cm/m   RA Area:     15.50 cm LA Vol (A2C):   61.8 ml 27.05 ml/m  RA Volume:   41.30 ml  18.07 ml/m LA Vol (A4C):   59.4 ml 26.00 ml/m LA Biplane Vol: 62.8 ml 27.48 ml/m  AORTIC VALVE AV Area (Vmax):    1.65 cm AV Area (Vmean):   1.71 cm AV Area (VTI):     1.76 cm AV Vmax:  242.50 cm/s AV Vmean:          158.500 cm/s AV VTI:            0.460 m AV Peak Grad:      23.5 mmHg AV Mean Grad:      12.0 mmHg LVOT Vmax:         127.00 cm/s LVOT Vmean:        86.500 cm/s LVOT VTI:          0.258 m LVOT/AV VTI ratio: 0.56  AORTA Ao Root diam: 3.40 cm MITRAL VALVE MV Area (PHT): 5.02 cm     SHUNTS MV Area VTI:   3.63 cm     Systemic VTI:  0.26 m MV Peak grad:  4.1 mmHg     Systemic  Diam: 2.00 cm MV Mean grad:  2.5 mmHg MV Vmax:       1.02 m/s MV Vmean:      68.0 cm/s MV Decel Time: 151 msec MV E velocity: 85.30 cm/s MV A velocity: 112.00 cm/s MV E/A ratio:  0.76 Landscape architect signed by Phineas Inches Signature Date/Time: 01/22/2022/1:55:37 PM    Final    DG HIP UNILAT WITH PELVIS 1V LEFT  Result Date: 01/20/2022 CLINICAL DATA:  Fracture proximal left femur EXAM: DG HIP (WITH OR WITHOUT PELVIS) 1V*L* COMPARISON:  01/19/2022 FINDINGS: There is interval left hip arthroplasty. No fracture lines are seen. Surgical clips are seen in the pelvis. IMPRESSION: Interval left hip arthroplasty. Electronically Signed   By: Elmer Picker M.D.   On: 01/20/2022 12:32   DG HIP OPERATIVE UNILAT W OR W/O PELVIS LEFT  Result Date: 01/20/2022 CLINICAL DATA:  Postoperative LEFT hip arthroplasty. EXAM: OPERATIVE LEFT HIP (WITH PELVIS IF PERFORMED) 2 VIEWS TECHNIQUE: Fluoroscopic spot image(s) were submitted for interpretation post-operatively. COMPARISON:  Exam of earlier the same date. FINDINGS: Surgical clips are noted about the pelvis. Interval LEFT hip arthroplasty. Acetabular component appears well placed on the AP projection but cannot be well evaluated on the lateral projection due to artifact from adjacent support apparatus. Gas present in the soft tissues about the LEFT hip compatible with recent surgery. Femoral component appears well seated without signs of acute process. Limited view of the pelvis excludes the upper iliac crests. Pelvis otherwise unremarkable. IMPRESSION: Interval LEFT hip arthroplasty as above. No immediate complications. Postoperative changes about the LEFT hip. Acetabular component not well assessed on cross-table lateral due to adjacent support devices. Electronically Signed   By: Zetta Bills M.D.   On: 01/20/2022 13:52   DG FEMUR PORT MIN 2 VIEWS LEFT  Result Date: 01/19/2022 CLINICAL DATA:  Known left femoral fracture EXAM: LEFT FEMUR PORTABLE 2 VIEWS  COMPARISON:  None. FINDINGS: Left femoral neck fracture is noted with impaction at the fracture site. Distal femur is within normal limits. Degenerative changes about the knee joint are seen. IMPRESSION: Left femoral neck fracture with impaction. Electronically Signed   By: Inez Catalina M.D.   On: 01/19/2022 20:45   VAS [place] LOWER EXTREMITY VENOUS (DVT)  Result Date: 01/21/2022  Lower Venous DVT Study Patient Name:  [name]  Date of Exam:   01/20/2022 Medical Rec #: [id number]       Accession #:    [id number] Date of Birth: 1936/10/11       Patient Gender: M Patient Age:   29 years Exam Location:  [facility] Procedure:      VAS [place] LOWER EXTREMITY VENOUS (DVT) Referring Phys: Nicki Gu[place]  Ishan Sanroman --------------------------------------------------------------------------------  Indications: Edema.  Comparison Study: 01/31/21 prior Performing Technologist: Archie Patten RVS  Examination Guidelines: A complete evaluation includes B-mode imaging, spectral Doppler, color Doppler, and power Doppler as needed of all accessible portions of each vessel. Bilateral testing is considered an integral part of a complete examination. Limited examinations for reoccurring indications may be performed as noted. The reflux portion of the exam is performed with the patient in reverse Trendelenburg.  +---------+---------------+---------+-----------+----------+-------------------+  RIGHT     Compressibility Phasicity Spontaneity Properties Thrombus Aging       +---------+---------------+---------+-----------+----------+-------------------+  CFV       Full            Yes       Yes                                         +---------+---------------+---------+-----------+----------+-------------------+  SFJ       Full                                                                  +---------+---------------+---------+-----------+----------+-------------------+  FV Prox   Full                                                                   +---------+---------------+---------+-----------+----------+-------------------+  FV Mid    Full                                                                  +---------+---------------+---------+-----------+----------+-------------------+  FV Distal Full                                                                  +---------+---------------+---------+-----------+----------+-------------------+  PFV       Full                                                                  +---------+---------------+---------+-----------+----------+-------------------+  POP       Full            Yes       Yes                                         +---------+---------------+---------+-----------+----------+-------------------+  PTV       Full                                                                  +---------+---------------+---------+-----------+----------+-------------------+  PERO                                                       Not well visualized  +---------+---------------+---------+-----------+----------+-------------------+   +---------+---------------+---------+-----------+----------+-------------------+  LEFT      Compressibility Phasicity Spontaneity Properties Thrombus Aging       +---------+---------------+---------+-----------+----------+-------------------+  CFV       Full            Yes       Yes                                         +---------+---------------+---------+-----------+----------+-------------------+  SFJ       Full                                                                  +---------+---------------+---------+-----------+----------+-------------------+  FV Prox   Full                                                                  +---------+---------------+---------+-----------+----------+-------------------+  FV Mid    Full                                                                   +---------+---------------+---------+-----------+----------+-------------------+  FV Distal Full                                                                  +---------+---------------+---------+-----------+----------+-------------------+  PFV       Full                                                                  +---------+---------------+---------+-----------+----------+-------------------+  POP  Full            Yes       Yes                                         +---------+---------------+---------+-----------+----------+-------------------+  PTV       Full                                                                  +---------+---------------+---------+-----------+----------+-------------------+  PERO                                                       Not well visualized  +---------+---------------+---------+-----------+----------+-------------------+     Summary: BILATERAL: - No evidence of deep vein thrombosis seen in the lower extremities, bilaterally. -No evidence of popliteal cyst, bilaterally.   *See table(s) above for measurements and observations. Electronically signed by Harold Barban MD on 01/21/2022 at 5:31:44 PM.    Final     DISCHARGE EXAMINATION: Vitals:   01/23/22 0744 01/23/22 1225 01/23/22 1926 01/24/22 0501  BP: 118/68 (!) 106/54 122/68 110/69  Pulse: 88 79    Resp: 17 18 17 18   Temp: 97.6 F (36.4 C) 99.1 F (37.3 C) 98.8 F (37.1 C) 98.4 F (36.9 C)  TempSrc: Oral Oral Oral Oral  SpO2: 94% 98% 97% 93%  Weight:      Height:       General appearance: Awake alert.  In no distress Resp: Clear to auscultation bilaterally.  Normal effort Cardio: S1-S2 is normal regular.  No S3-S4.  No rubs murmurs or bruit GI: Abdomen is soft.  Nontender nondistended.  Bowel sounds are present normal.  No masses organomegaly    DISPOSITION: SNF  Discharge Instructions     Call MD for:  difficulty breathing, headache or visual disturbances   Complete by: As  directed    Call MD for:  extreme fatigue   Complete by: As directed    Call MD for:  persistant dizziness or light-headedness   Complete by: As directed    Call MD for:  persistant nausea and vomiting   Complete by: As directed    Call MD for:  severe uncontrolled pain   Complete by: As directed    Call MD for:  temperature >100.4   Complete by: As directed    Diet - low sodium heart healthy   Complete by: As directed    Discharge instructions   Complete by: As directed    Please review instructions on the discharge summary.  You were cared for by a hospitalist during your hospital stay. If you have any questions about your discharge medications or the care you received while you were in the hospital after you are discharged, you can call the unit and asked to speak with the hospitalist on call if the hospitalist that took care of you is not available. Once you are discharged, your primary care physician will handle any further medical issues. Please note that NO REFILLS for any  discharge medications will be authorized once you are discharged, as it is imperative that you return to your primary care physician (or establish a relationship with a primary care physician if you do not have one) for your aftercare needs so that they can reassess your need for medications and monitor your lab values. If you do not have a primary care physician, you can call [phone] for a physician referral.   Discharge wound care:   Complete by: As directed    Wound care as per orthopedics.   Increase activity slowly   Complete by: As directed           Allergies as of 01/24/2022   No Known Allergies      Medication List     STOP taking these medications    doxycycline 50 MG capsule Commonly known as: VIBRAMYCIN   Garlic 948 MG Caps   GLUCOSAMINE CHONDROITIN JOINT PO   ibuprofen 200 MG tablet Commonly known as: ADVIL   OVER THE COUNTER MEDICATION   oxyCODONE-acetaminophen 5-325 MG  tablet Commonly known as: Product manager COVID-19 Vac Bivalent injection Generic drug: COVID-19 mRNA bivalent vaccine Therapist, music)   Soya Lecithin 1200 MG Caps   STOOL SOFTENER PO   traMADol 50 MG tablet Commonly known as: ULTRAM       TAKE these medications    acetaminophen 325 MG tablet Commonly known as: TYLENOL Take 2 tablets (650 mg total) by mouth every 6 (six) hours as needed for mild pain (or Fever >/= 101).   aspirin 81 MG tablet Take 81 mg by mouth daily.   beta carotene 25000 UNIT capsule Take 25,000 Units by mouth daily.   cefadroxil 500 MG capsule Commonly known as: DURICEF Take 1 capsule (500 mg total) by mouth 2 (two) times daily for 4 days.   cholecalciferol 1000 units tablet Commonly known as: VITAMIN D Take 1,000 Units by mouth daily.   donepezil 5 MG tablet Commonly known as: ARICEPT Take 5 mg by mouth at bedtime.   enoxaparin 40 MG/0.4ML injection Commonly known as: LOVENOX Inject 0.4 mLs (40 mg total) into the skin daily for 26 days. Start taking on: January 25, 2022   EPA PO Take 1 g by mouth daily.   feeding supplement Liqd Take 237 mLs by mouth 3 (three) times daily between meals.   gabapentin 300 MG capsule Commonly known as: NEURONTIN Take 300 mg by mouth 3 (three) times daily.   HYDROcodone-acetaminophen 5-325 MG tablet Commonly known as: NORCO/VICODIN Take 1-2 tablets by mouth every 4 (four) hours as needed for moderate pain.   meloxicam 15 MG tablet Commonly known as: MOBIC Take 15 mg by mouth daily.   methocarbamol 500 MG tablet Commonly known as: Robaxin Take 1 tablet (500 mg total) by mouth 4 (four) times daily. What changed:  when to take this Another medication with the same name was removed. Continue taking this medication, and follow the directions you see here.   multivitamin with minerals Tabs tablet Take 1 tablet by mouth daily.   polyethylene glycol 17 g packet Commonly known as: MIRALAX /  GLYCOLAX Take 17 g by mouth daily. Start taking on: January 25, 2022   PROBIOTIC ADVANCED PO Take 1 capsule by mouth daily.   senna-docusate 8.6-50 MG tablet Commonly known as: Senokot-S Take 2 tablets by mouth 2 (two) times daily.   simvastatin 40 MG tablet Commonly known as: ZOCOR Take 40 mg by mouth daily with breakfast.   vitamin C 500  MG tablet Commonly known as: ASCORBIC ACID Take 1,000 mg by mouth daily.   vitamin E 180 MG (400 UNITS) capsule Take 400 Units by mouth daily.               Discharge Care Instructions  (From admission, onward)           Start     Ordered   01/24/22 0000  Discharge wound care:       Comments: Wound care as per orthopedics.   01/24/22 0857              Follow-up Information     Willaim Sheng, MD Follow up in 2 week(s).   Specialty: Orthopedic Surgery Contact information: [address] [id number] [phone]                 TOTAL DISCHARGE TIME: 24 minutes  [facility] Pager on [email or web address]  01/24/2022, 10:33 AM
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
## 2022-01-25 ENCOUNTER — Encounter: Payer: Medicare Other | Admitting: Physical Therapy

## 2022-01-25 LAB — CBC
HCT: 32.3 % — ABNORMAL LOW (ref 39.0–52.0)
Hemoglobin: 10.7 g/dL — ABNORMAL LOW (ref 13.0–17.0)
MCH: 30.8 pg (ref 26.0–34.0)
MCHC: 33.1 g/dL (ref 30.0–36.0)
MCV: 93.1 fL (ref 80.0–100.0)
Platelets: 251 10*3/uL (ref 150–400)
RBC: 3.47 MIL/uL — ABNORMAL LOW (ref 4.22–5.81)
RDW: 14.2 % (ref 11.5–15.5)
WBC: 11.7 10*3/uL — ABNORMAL HIGH (ref 4.0–10.5)
nRBC: 0 % (ref 0.0–0.2)

## 2022-01-25 LAB — BASIC METABOLIC PANEL
Anion gap: 11 (ref 5–15)
BUN: 31 mg/dL — ABNORMAL HIGH (ref 8–23)
CO2: 24 mmol/L (ref 22–32)
Calcium: 8.6 mg/dL — ABNORMAL LOW (ref 8.9–10.3)
Chloride: 101 mmol/L (ref 98–111)
Creatinine, Ser: 0.88 mg/dL (ref 0.61–1.24)
GFR, Estimated: >60 mL/min (ref >60)
Glucose, Bld: 115 mg/dL — ABNORMAL HIGH (ref 70–99)
Potassium: 4.3 mmol/L (ref 3.5–5.1)
Sodium: 136 mmol/L (ref 135–145)

## 2022-01-25 NOTE — TOC Transition Note (Addendum)
Transition of Care [facility]) - CM/SW Discharge Note   Patient Details  Name: [name] MRN: [id number] Date of Birth: [birth year]  [facility] CM/SW Contact:  Joanne Chars, LCSW Phone Number: 01/25/2022, 9:58 AM   Clinical Narrative:   Pt discharging to [facility].  RN call [phone] for report.      Final next level of care: Skilled Nursing Facility Barriers to Discharge: Barriers Resolved   Patient Goals and CMS Choice   CMS Medicare.gov Compare Post Acute Care list provided to:: Patient Represenative (must comment) Choice offered to / list presented to : Spouse  Discharge Placement              Patient chooses bed at:  [facility]) Patient to be transferred to facility by: [place] Name of family member notified: unable to reach wife today but DC was discussed with her yesterday Patient and family notified of of transfer: 01/24/22  1005: wife arrived at hospital and was informed of DC.  Discharge Plan and Services In-house Referral: Clinical Social Work   Post Acute Care Choice: [place]                               Social Determinants of Health (SDOH) Interventions     Readmission Risk Interventions No flowsheet data found.

## 2022-01-25 NOTE — Discharge Summary (Signed)
[facility]  Physician Discharge Summary   Patient ID: ROLAN WRIGHTSMAN MRN: [id number] DOB/AGE: [birth year] 86 y.o.  Admit date: 01/19/2022 Discharge date:   01/25/2022   PCP: Shon Baton, MD  DISCHARGE DIAGNOSES:  Principal Problem:   Closed displaced fracture of left femoral neck ([place]) Active Problems:   HLD (hyperlipidemia)   History of bladder cancer   Bilateral lower extremity edema   Elevated serum creatinine   Normocytic anemia   RECOMMENDATIONS FOR OUTPATIENT FOLLOW UP: Please check CBC and basic metabolic panel early next week Follow-up with orthopedics as recommended   Home Health: SNF Equipment/Devices: None  CODE STATUS: DNR  DISCHARGE CONDITION: fair  Diet recommendation: Regular as tolerated  INITIAL HISTORY: 86 y.o. male with medical history significant of lumbosacral radiculopathy, remote history of bladder and prostate cancer followed by urology at [facility], hyperlipidemia, lymphedema presented to [facility] orthopedics office with left hip pain and difficulty ambulating.  Imaging revealed displaced left femoral neck fracture.  He was hospitalized for further management.  Orthopedics was consulted.  Underwent surgery on 01/20/2022.  Stable for the most part.    Consultants: Orthopedics   Procedures:    Left hip arthroplasty 2/3   Lower extremity Doppler studies   HOSPITAL COURSE:    * Closed displaced fracture of left femoral neck ([place])- (present on admission) Patient was seen by orthopedics.  Underwent surgery on 01/20/2022.  Pain is well controlled.  Seen by PT and OT.  SNF is recommended.  Normocytic anemia Drop in hemoglobin is likely due to dilution as well as recent surgery.  Anemia panel reviewed.  No clear-cut deficiencies identified.  Hemoglobin is stable.  Elevated serum creatinine- (present on admission) Noted to have creatinine of 1.33 at admission.  Old labs reviewed.  Creatinine was 1.46 in February 2022.  Quite possible he has  an element of chronic disease but unknown stage at this time.  Will need to be worked up in the outpatient setting.   Renal function fluctuated here in the hospital.  Given gentle hydration for a few hours.  Noted to be better today.  Avoid nephrotoxic agents.  Bilateral lower extremity edema- (present on admission) Patient has chronic lower extremity edema.  He has a history of lymphedema.  Lower extremity Doppler studies without DVT.   BNP was normal.  Echocardiogram shows normal systolic function. Grade 1 diastolic dysfunction is noted.  No significant valvular abnormalities noted. He was noted to have low-grade fever with elevated WBCs. Noted to be on cefadroxil per orthopedics.. WBC appears to be improving. Continue to monitor.  No obvious source of infection identified otherwise.  Concern was for cellulitis of his lower extremities but those changes appear to be chronic. Continue compression stockings and keep legs elevated as much as possible  History of bladder cancer History of prostate cancer  Remote history of bladder and prostate cancer currently not on treatment. -Outpatient urology follow-up  HLD (hyperlipidemia)- (present on admission) Continue statin.   Patient is stable.  Okay for discharge to SNF when bed is available.   PERTINENT LABS:  The results of significant diagnostics from this hospitalization (including imaging, microbiology, ancillary and laboratory) are listed below for reference.    Microbiology: Recent Results (from the past 240 hour(s))  Surgical PCR screen     Status: None   Collection Time: 01/19/22  7:00 PM   Specimen: Nasal Mucosa; Nasal Swab  Result Value Ref Range Status   MRSA, PCR NEGATIVE NEGATIVE Final   Staphylococcus aureus  NEGATIVE NEGATIVE Final    Comment: (NOTE) The Xpert SA Assay (FDA approved for NASAL specimens in patients 30 years of age and older), is one component of a comprehensive surveillance program. It is not intended  to diagnose infection nor to guide or monitor treatment. Performed at [facility], [place] [address] [place], [place] [address]   Resp Panel by RT-PCR (Flu A&B, Covid) Nasopharyngeal Swab     Status: None   Collection Time: 01/20/22  7:30 AM   Specimen: Nasopharyngeal Swab; Nasopharyngeal(NP) swabs in vial transport medium  Result Value Ref Range Status   SARS Coronavirus 2 by RT PCR NEGATIVE NEGATIVE Final    Comment: (NOTE) SARS-CoV-2 target nucleic acids are NOT DETECTED.  The SARS-CoV-2 RNA is generally detectable in upper respiratory specimens during the acute phase of infection. The lowest concentration of SARS-CoV-2 viral copies this assay can detect is 138 copies/mL. A negative result does not preclude SARS-Cov-2 infection and should not be used as the sole basis for treatment or other patient management decisions. A negative result may occur with  improper specimen collection/handling, submission of specimen other than nasopharyngeal swab, presence of viral mutation(s) within the areas targeted by this assay, and inadequate number of viral copies(<138 copies/mL). A negative result must be combined with clinical observations, patient history, and epidemiological information. The expected result is Negative.  Fact Sheet for Patients:  EntrepreneurPulse.com.au  Fact Sheet for Healthcare Providers:  IncredibleEmployment.be  This test is no t yet approved or cleared by the [place] FDA and  has been authorized for detection and/or diagnosis of SARS-CoV-2 by FDA under an Emergency Use Authorization (EUA). This EUA will remain  in effect (meaning this test can be used) for the duration of the COVID-19 declaration under Section 564(b)(1) of the Act, 21 U.S.C.section 360bbb-3(b)(1), unless the authorization is terminated  or revoked sooner.       Influenza A by PCR NEGATIVE NEGATIVE Final   Influenza B by PCR NEGATIVE NEGATIVE  Final    Comment: (NOTE) The Xpert Xpress SARS-CoV-2/FLU/RSV plus assay is intended as an aid in the diagnosis of influenza from Nasopharyngeal swab specimens and should not be used as a sole basis for treatment. Nasal washings and aspirates are unacceptable for Xpert Xpress SARS-CoV-2/FLU/RSV testing.  Fact Sheet for Patients: EntrepreneurPulse.com.au  Fact Sheet for Healthcare Providers: IncredibleEmployment.be  This test is not yet approved or cleared by the [place] FDA and has been authorized for detection and/or diagnosis of SARS-CoV-2 by FDA under an Emergency Use Authorization (EUA). This EUA will remain in effect (meaning this test can be used) for the duration of the COVID-19 declaration under Section 564(b)(1) of the Act, 21 U.S.C. section 360bbb-3(b)(1), unless the authorization is terminated or revoked.  Performed at [facility], [place] [address] [place],  [address]   Resp Panel by RT-PCR (Flu A&B, Covid) Nasopharyngeal Swab     Status: None   Collection Time: 01/24/22 12:04 PM   Specimen: Nasopharyngeal Swab; Nasopharyngeal(NP) swabs in vial transport medium  Result Value Ref Range Status   SARS Coronavirus 2 by RT PCR NEGATIVE NEGATIVE Final    Comment: (NOTE) SARS-CoV-2 target nucleic acids are NOT DETECTED.  The SARS-CoV-2 RNA is generally detectable in upper respiratory specimens during the acute phase of infection. The lowest concentration of SARS-CoV-2 viral copies this assay can detect is 138 copies/mL. A negative result does not preclude SARS-Cov-2 infection and should not be used as the sole basis for treatment  or other patient management decisions. A negative result may occur with  improper specimen collection/handling, submission of specimen other than nasopharyngeal swab, presence of viral mutation(s) within the areas targeted by this assay, and inadequate number of viral copies(<138 copies/mL). A  negative result must be combined with clinical observations, patient history, and epidemiological information. The expected result is Negative.  Fact Sheet for Patients:  EntrepreneurPulse.com.au  Fact Sheet for Healthcare Providers:  IncredibleEmployment.be  This test is no t yet approved or cleared by the [place] FDA and  has been authorized for detection and/or diagnosis of SARS-CoV-2 by FDA under an Emergency Use Authorization (EUA). This EUA will remain  in effect (meaning this test can be used) for the duration of the COVID-19 declaration under Section 564(b)(1) of the Act, 21 U.S.C.section 360bbb-3(b)(1), unless the authorization is terminated  or revoked sooner.       Influenza A by PCR NEGATIVE NEGATIVE Final   Influenza B by PCR NEGATIVE NEGATIVE Final    Comment: (NOTE) The Xpert Xpress SARS-CoV-2/FLU/RSV plus assay is intended as an aid in the diagnosis of influenza from Nasopharyngeal swab specimens and should not be used as a sole basis for treatment. Nasal washings and aspirates are unacceptable for Xpert Xpress SARS-CoV-2/FLU/RSV testing.  Fact Sheet for Patients: EntrepreneurPulse.com.au  Fact Sheet for Healthcare Providers: IncredibleEmployment.be  This test is not yet approved or cleared by the [place] FDA and has been authorized for detection and/or diagnosis of SARS-CoV-2 by FDA under an Emergency Use Authorization (EUA). This EUA will remain in effect (meaning this test can be used) for the duration of the COVID-19 declaration under Section 564(b)(1) of the Act, 21 U.S.C. section 360bbb-3(b)(1), unless the authorization is terminated or revoked.  Performed at [facility], [place] [address], [place], [place] [address]      Labs:  COVID-19 Labs  Recent Labs    01/23/22 0354  FERRITIN 416*    Lab Results  Component Value Date   SARSCOV2NAA NEGATIVE  01/24/2022   [place] NEGATIVE 01/20/2022   [place] NEGATIVE 01/25/2021      Basic Metabolic Panel: Recent Labs  Lab 01/21/22 0622 01/22/22 0255 01/23/22 0354 01/24/22 0422 01/25/22 0154  NA 136 135 132* 138 136  K 4.9 4.4 4.4 4.6 4.3  CL 102 103 101 105 101  CO2 26 24 23 24 24   GLUCOSE 120* 121* 115* 123* 115*  BUN 23 30* 29* 30* 31*  CREATININE 1.34* 1.51* 1.31* 1.23 0.88  CALCIUM 8.9 8.3* 8.0* 8.5* 8.6*   Liver Function Tests: Recent Labs  Lab 01/19/22 1947  AST 21  ALT 16  ALKPHOS 97  BILITOT 0.6  PROT 6.5  ALBUMIN 3.3*    CBC: Recent Labs  Lab 01/21/22 0622 01/22/22 0255 01/23/22 0354 01/24/22 0422 01/25/22 0154  WBC 16.8* 13.8* 12.9* 12.2* 11.7*  HGB 12.5* 10.9* 10.7* 10.7* 10.7*  HCT 38.8* 32.6* 31.6* 31.8* 32.3*  MCV 93.5 93.9 94.6 93.3 93.1  PLT 227 166 157 205 251    BNP: BNP (last 3 results) Recent Labs    01/20/22 0222  BNP 92.3     IMAGING STUDIES DG Pelvis 1-2 Views  Result Date: 01/19/2022 CLINICAL DATA:  Known left hip fracture EXAM: PELVIS - 1 VIEW COMPARISON:  Films from earlier in the same day. FINDINGS: Pelvic ring is intact. Postsurgical changes in lumbar spine and pelvis are seen. Left subcapital femoral neck fracture is noted with impaction at the fracture site. No other focal abnormality  is noted. IMPRESSION: Left femoral neck fracture with impaction. Electronically Signed   By: Inez Catalina M.D.   On: 01/19/2022 20:45   Chest Portable 1 View  Result Date: 01/19/2022 CLINICAL DATA:  Preop evaluation, known history of femoral neck fracture EXAM: PORTABLE CHEST 1 VIEW COMPARISON:  12/24/2015 FINDINGS: Cardiac shadow is within normal limits. Postsurgical changes in the left chest wall and left shoulder are seen. No acute bony abnormality is noted. The lungs are clear bilaterally. IMPRESSION: No acute abnormality noted. Electronically Signed   By: Inez Catalina M.D.   On: 01/19/2022 20:41   ECHOCARDIOGRAM COMPLETE  Result  Date: 01/22/2022    ECHOCARDIOGRAM REPORT   Patient Name:   CHRISHUN SCHEER Degnan Date of Exam: 01/22/2022 Medical Rec #:  [id number]      Height:       74.0 in Accession #:    [id number]     Weight:       225.0 lb Date of Birth:  Apr 21, 1936      BSA:          2.285 m Patient Age:    61 years       BP:           103/72 mmHg Patient Gender: M              HR:           92 bpm. Exam Location:  [facility] Procedure: 2D Echo, Cardiac Doppler and Color Doppler Indications:    CHF  History:        Patient has prior history of Echocardiogram examinations, most                 recent 12/25/2015. Signs/Symptoms:Dyspnea; Risk                 Factors:Dyslipidemia and Former Smoker. RT femoral fx.  Sonographer:    Wenda Low Referring Phys: [id number] DANIEL A [place]  1. Very limited windows. EF appears normal. 60-65%. Left ventricular ejection fraction, by estimation, is 60 to 65%. The left ventricle has normal function. The left ventricle has no regional wall motion abnormalities. Left ventricular diastolic parameters are consistent with Grade I diastolic dysfunction (impaired relaxation).  2. Right ventricular systolic function is normal. The right ventricular size is normal. Tricuspid regurgitation signal is inadequate for assessing PA pressure.  3. The mitral valve was not well visualized.  4. The aortic valve was not well visualized. Aortic valve regurgitation is not visualized. No aortic stenosis is present.  5. The inferior vena cava is dilated in size with >50% respiratory variability, suggesting right atrial pressure of 8 mmHg. Comparison(s): No significant change from prior study. Conclusion(s)/Recommendation(s): Otherwise normal echocardiogram, with minor abnormalities described in the report. FINDINGS  Left Ventricle: Very limited windows. EF appears normal. 60-65%. Left ventricular ejection fraction, by estimation, is 60 to 65%. The left ventricle has normal function. The left ventricle has no regional wall  motion abnormalities. The left ventricular internal cavity size was normal in size. Left ventricular diastolic parameters are consistent with Grade I diastolic dysfunction (impaired relaxation). Right Ventricle: The right ventricular size is normal. No increase in right ventricular wall thickness. Right ventricular systolic function is normal. Tricuspid regurgitation signal is inadequate for assessing PA pressure. Left Atrium: Left atrial size was normal in size. Right Atrium: Right atrial size was normal in size. Pericardium: There is no evidence of pericardial effusion. Mitral Valve: The mitral valve was not well visualized. MV peak  gradient, 4.1 mmHg. The mean mitral valve gradient is 2.5 mmHg. Tricuspid Valve: The tricuspid valve is not well visualized. Tricuspid valve regurgitation is not demonstrated. Aortic Valve: The aortic valve was not well visualized. Aortic valve regurgitation is not visualized. No aortic stenosis is present. Aortic valve mean gradient measures 12.0 mmHg. Aortic valve peak gradient measures 23.5 mmHg. Aortic valve area, by VTI measures 1.76 cm. Pulmonic Valve: The pulmonic valve was not well visualized. Pulmonic valve regurgitation is not visualized. Aorta: The aortic root and ascending aorta are structurally normal, with no evidence of dilitation. Venous: The inferior vena cava is dilated in size with greater than 50% respiratory variability, suggesting right atrial pressure of 8 mmHg. IAS/Shunts: The interatrial septum was not well visualized.  LEFT VENTRICLE PLAX 2D LVIDd:         4.30 cm LVIDs:         2.50 cm LV PW:         1.20 cm LV IVS:        1.30 cm LVOT diam:     2.00 cm LV SV:         81 LV SV Index:   35 LVOT Area:     3.14 cm  LV Volumes (MOD) LV vol d, MOD A2C: 52.2 ml LV vol d, MOD A4C: 61.4 ml LV vol s, MOD A2C: 18.1 ml LV vol s, MOD A4C: 23.6 ml LV SV MOD A2C:     34.1 ml LV SV MOD A4C:     61.4 ml LV SV MOD BP:      39.4 ml RIGHT VENTRICLE RV Basal diam:  3.55 cm RV  Mid diam:    2.70 cm RV S prime:     11.50 cm/s TAPSE (M-mode): 2.7 cm LEFT ATRIUM             Index        RIGHT ATRIUM           Index LA diam:        3.80 cm 1.66 cm/m   RA Area:     15.50 cm LA Vol (A2C):   61.8 ml 27.05 ml/m  RA Volume:   41.30 ml  18.07 ml/m LA Vol (A4C):   59.4 ml 26.00 ml/m LA Biplane Vol: 62.8 ml 27.48 ml/m  AORTIC VALVE AV Area (Vmax):    1.65 cm AV Area (Vmean):   1.71 cm AV Area (VTI):     1.76 cm AV Vmax:           242.50 cm/s AV Vmean:          158.500 cm/s AV VTI:            0.460 m AV Peak Grad:      23.5 mmHg AV Mean Grad:      12.0 mmHg LVOT Vmax:         127.00 cm/s LVOT Vmean:        86.500 cm/s LVOT VTI:          0.258 m LVOT/AV VTI ratio: 0.56  AORTA Ao Root diam: 3.40 cm MITRAL VALVE MV Area (PHT): 5.02 cm     SHUNTS MV Area VTI:   3.63 cm     Systemic VTI:  0.26 m MV Peak grad:  4.1 mmHg     Systemic Diam: 2.00 cm MV Mean grad:  2.5 mmHg MV Vmax:       1.02 m/s MV Vmean:      68.0 cm/s  MV Decel Time: 151 msec MV E velocity: 85.30 cm/s MV A velocity: 112.00 cm/s MV E/A ratio:  0.76 Landscape architect signed by Phineas Inches Signature Date/Time: 01/22/2022/1:55:37 PM    Final    DG HIP UNILAT WITH PELVIS 1V LEFT  Result Date: 01/20/2022 CLINICAL DATA:  Fracture proximal left femur EXAM: DG HIP (WITH OR WITHOUT PELVIS) 1V*L* COMPARISON:  01/19/2022 FINDINGS: There is interval left hip arthroplasty. No fracture lines are seen. Surgical clips are seen in the pelvis. IMPRESSION: Interval left hip arthroplasty. Electronically Signed   By: Elmer Picker M.D.   On: 01/20/2022 12:32   DG HIP OPERATIVE UNILAT W OR W/O PELVIS LEFT  Result Date: 01/20/2022 CLINICAL DATA:  Postoperative LEFT hip arthroplasty. EXAM: OPERATIVE LEFT HIP (WITH PELVIS IF PERFORMED) 2 VIEWS TECHNIQUE: Fluoroscopic spot image(s) were submitted for interpretation post-operatively. COMPARISON:  Exam of earlier the same date. FINDINGS: Surgical clips are noted about the pelvis. Interval  LEFT hip arthroplasty. Acetabular component appears well placed on the AP projection but cannot be well evaluated on the lateral projection due to artifact from adjacent support apparatus. Gas present in the soft tissues about the LEFT hip compatible with recent surgery. Femoral component appears well seated without signs of acute process. Limited view of the pelvis excludes the upper iliac crests. Pelvis otherwise unremarkable. IMPRESSION: Interval LEFT hip arthroplasty as above. No immediate complications. Postoperative changes about the LEFT hip. Acetabular component not well assessed on cross-table lateral due to adjacent support devices. Electronically Signed   By: Zetta Bills M.D.   On: 01/20/2022 13:52   DG FEMUR PORT MIN 2 VIEWS LEFT  Result Date: 01/19/2022 CLINICAL DATA:  Known left femoral fracture EXAM: LEFT FEMUR PORTABLE 2 VIEWS COMPARISON:  None. FINDINGS: Left femoral neck fracture is noted with impaction at the fracture site. Distal femur is within normal limits. Degenerative changes about the knee joint are seen. IMPRESSION: Left femoral neck fracture with impaction. Electronically Signed   By: Inez Catalina M.D.   On: 01/19/2022 20:45   VAS [place] LOWER EXTREMITY VENOUS (DVT)  Result Date: 01/21/2022  Lower Venous DVT Study Patient Name:  FORD PEDDIE Ovando  Date of Exam:   01/20/2022 Medical Rec #: [id number]       Accession #:    [id number] Date of Birth: Jul 25, 1936       Patient Gender: M Patient Age:   35 years Exam Location:  [facility] Procedure:      VAS [place] LOWER EXTREMITY VENOUS (DVT) Referring Phys: Bonnielee Haff --------------------------------------------------------------------------------  Indications: Edema.  Comparison Study: 01/31/21 prior Performing Technologist: Archie Patten RVS  Examination Guidelines: A complete evaluation includes B-mode imaging, spectral Doppler, color Doppler, and power Doppler as needed of all accessible portions of each vessel. Bilateral testing is  considered an integral part of a complete examination. Limited examinations for reoccurring indications may be performed as noted. The reflux portion of the exam is performed with the patient in reverse Trendelenburg.  +---------+---------------+---------+-----------+----------+-------------------+  RIGHT     Compressibility Phasicity Spontaneity Properties Thrombus Aging       +---------+---------------+---------+-----------+----------+-------------------+  CFV       Full            Yes       Yes                                         +---------+---------------+---------+-----------+----------+-------------------+  SFJ       Full                                                                  +---------+---------------+---------+-----------+----------+-------------------+  FV Prox   Full                                                                  +---------+---------------+---------+-----------+----------+-------------------+  FV Mid    Full                                                                  +---------+---------------+---------+-----------+----------+-------------------+  FV Distal Full                                                                  +---------+---------------+---------+-----------+----------+-------------------+  PFV       Full                                                                  +---------+---------------+---------+-----------+----------+-------------------+  POP       Full            Yes       Yes                                         +---------+---------------+---------+-----------+----------+-------------------+  PTV       Full                                                                  +---------+---------------+---------+-----------+----------+-------------------+  PERO                                                       Not well visualized  +---------+---------------+---------+-----------+----------+-------------------+    +---------+---------------+---------+-----------+----------+-------------------+  LEFT      Compressibility Phasicity Spontaneity Properties Thrombus Aging       +---------+---------------+---------+-----------+----------+-------------------+  CFV  Full            Yes       Yes                                         +---------+---------------+---------+-----------+----------+-------------------+  SFJ       Full                                                                  +---------+---------------+---------+-----------+----------+-------------------+  FV Prox   Full                                                                  +---------+---------------+---------+-----------+----------+-------------------+  FV Mid    Full                                                                  +---------+---------------+---------+-----------+----------+-------------------+  FV Distal Full                                                                  +---------+---------------+---------+-----------+----------+-------------------+  PFV       Full                                                                  +---------+---------------+---------+-----------+----------+-------------------+  POP       Full            Yes       Yes                                         +---------+---------------+---------+-----------+----------+-------------------+  PTV       Full                                                                  +---------+---------------+---------+-----------+----------+-------------------+  PERO  Not well visualized  +---------+---------------+---------+-----------+----------+-------------------+     Summary: BILATERAL: - No evidence of deep vein thrombosis seen in the lower extremities, bilaterally. -No evidence of popliteal cyst, bilaterally.   *See table(s) above for measurements and observations. Electronically signed by Harold Barban  MD on 01/21/2022 at 5:31:44 PM.    Final     DISCHARGE EXAMINATION: Vitals:   01/24/22 1546 01/24/22 2123 01/25/22 0354 01/25/22 0736  BP: 135/68 127/78 132/74 131/75  Pulse: 89 (!) 103 98 94  Resp: 19 17 16 16   Temp: 98.1 F (36.7 C) 98.9 F (37.2 C) 99.1 F (37.3 C) 99 F (37.2 C)  TempSrc: Oral  Oral Oral  SpO2: 95% 96% 93% 93%  Weight:      Height:       General appearance: Awake alert.  In no distress Resp: Clear to auscultation bilaterally.  Normal effort Cardio: S1-S2 is normal regular.  No S3-S4.  No rubs murmurs or bruit GI: Abdomen is soft.  Nontender nondistended.  Bowel sounds are present normal.  No masses organomegaly    DISPOSITION: SNF  Discharge Instructions     Call MD for:  difficulty breathing, headache or visual disturbances   Complete by: As directed    Call MD for:  extreme fatigue   Complete by: As directed    Call MD for:  persistant dizziness or light-headedness   Complete by: As directed    Call MD for:  persistant nausea and vomiting   Complete by: As directed    Call MD for:  severe uncontrolled pain   Complete by: As directed    Call MD for:  temperature >100.4   Complete by: As directed    Diet - low sodium heart healthy   Complete by: As directed    Discharge instructions   Complete by: As directed    Please review instructions on the discharge summary.  You were cared for by a hospitalist during your hospital stay. If you have any questions about your discharge medications or the care you received while you were in the hospital after you are discharged, you can call the unit and asked to speak with the hospitalist on call if the hospitalist that took care of you is not available. Once you are discharged, your primary care physician will handle any further medical issues. Please note that NO REFILLS for any discharge medications will be authorized once you are discharged, as it is imperative that you return to your primary care physician  (or establish a relationship with a primary care physician if you do not have one) for your aftercare needs so that they can reassess your need for medications and monitor your lab values. If you do not have a primary care physician, you can call [phone] for a physician referral.   Discharge wound care:   Complete by: As directed    Wound care as per orthopedics.   Increase activity slowly   Complete by: As directed           Allergies as of 01/25/2022   No Known Allergies      Medication List     STOP taking these medications    doxycycline 50 MG capsule Commonly known as: VIBRAMYCIN   Garlic 578 MG Caps   GLUCOSAMINE CHONDROITIN JOINT PO   ibuprofen 200 MG tablet Commonly known as: ADVIL   OVER THE COUNTER MEDICATION   oxyCODONE-acetaminophen 5-325 MG tablet Commonly known as: Product manager COVID-19 Vac Bivalent injection  Generic drug: COVID-19 mRNA bivalent vaccine (Pfizer)   Soya Lecithin 1200 MG Caps   STOOL SOFTENER PO   traMADol 50 MG tablet Commonly known as: ULTRAM       TAKE these medications    acetaminophen 325 MG tablet Commonly known as: TYLENOL Take 2 tablets (650 mg total) by mouth every 6 (six) hours as needed for mild pain (or Fever >/= 101).   aspirin 81 MG tablet Take 81 mg by mouth daily.   beta carotene 25000 UNIT capsule Take 25,000 Units by mouth daily.   cefadroxil 500 MG capsule Commonly known as: DURICEF Take 1 capsule (500 mg total) by mouth 2 (two) times daily for 4 days.   cholecalciferol 1000 units tablet Commonly known as: VITAMIN D Take 1,000 Units by mouth daily.   donepezil 5 MG tablet Commonly known as: ARICEPT Take 5 mg by mouth at bedtime.   enoxaparin 40 MG/0.4ML injection Commonly known as: LOVENOX Inject 0.4 mLs (40 mg total) into the skin daily for 26 days.   EPA PO Take 1 g by mouth daily.   feeding supplement Liqd Take 237 mLs by mouth 3 (three) times daily between meals.    gabapentin 300 MG capsule Commonly known as: NEURONTIN Take 300 mg by mouth 3 (three) times daily.   HYDROcodone-acetaminophen 5-325 MG tablet Commonly known as: NORCO/VICODIN Take 1-2 tablets by mouth every 4 (four) hours as needed for moderate pain.   meloxicam 15 MG tablet Commonly known as: MOBIC Take 15 mg by mouth daily.   methocarbamol 500 MG tablet Commonly known as: Robaxin Take 1 tablet (500 mg total) by mouth 4 (four) times daily. What changed:  when to take this Another medication with the same name was removed. Continue taking this medication, and follow the directions you see here.   multivitamin with minerals Tabs tablet Take 1 tablet by mouth daily.   polyethylene glycol 17 g packet Commonly known as: MIRALAX / GLYCOLAX Take 17 g by mouth daily.   PROBIOTIC ADVANCED PO Take 1 capsule by mouth daily.   senna-docusate 8.6-50 MG tablet Commonly known as: Senokot-S Take 2 tablets by mouth 2 (two) times daily.   simvastatin 40 MG tablet Commonly known as: ZOCOR Take 40 mg by mouth daily with breakfast.   vitamin C 500 MG tablet Commonly known as: ASCORBIC ACID Take 1,000 mg by mouth daily.   vitamin E 180 MG (400 UNITS) capsule Take 400 Units by mouth daily.               Discharge Care Instructions  (From admission, onward)           Start     Ordered   01/24/22 0000  Discharge wound care:       Comments: Wound care as per orthopedics.   01/24/22 0857              Contact information for follow-up providers     Willaim Sheng, MD Follow up in 2 week(s).   Specialty: Orthopedic Surgery Contact information: [address] [place] [place] [address] [phone]              Contact information for after-discharge care     Destination     [facility] .   Service: Skilled Nursing Contact information: [address] [place] [phone]  TOTAL DISCHARGE TIME: 35 minutes  [facility] Pager on [email or web address]  01/25/2022, 8:52 AM

## 2022-01-26 ENCOUNTER — Encounter: Payer: Medicare Other | Admitting: Physical Therapy

## 2022-01-30 ENCOUNTER — Encounter: Payer: Medicare Other | Admitting: Physical Therapy

## 2022-02-01 ENCOUNTER — Encounter: Payer: Medicare Other | Admitting: Physical Therapy

## 2022-02-08 ENCOUNTER — Non-Acute Institutional Stay: Payer: Medicare Other | Admitting: Internal Medicine

## 2022-02-08 ENCOUNTER — Encounter: Payer: Self-pay | Admitting: Internal Medicine

## 2022-02-08 ENCOUNTER — Encounter: Payer: Medicare Other | Admitting: Physical Therapy

## 2022-02-08 ENCOUNTER — Other Ambulatory Visit: Payer: Self-pay

## 2022-02-08 VITALS — BP 110/60 | HR 84 | Temp 97.8°F | Resp 20 | Wt 225.0 lb

## 2022-02-08 DIAGNOSIS — L03116 Cellulitis of left lower limb: Secondary | ICD-10-CM

## 2022-02-08 DIAGNOSIS — Z89011 Acquired absence of right thumb: Secondary | ICD-10-CM

## 2022-02-08 DIAGNOSIS — L89626 Pressure-induced deep tissue damage of left heel: Secondary | ICD-10-CM | POA: Insufficient documentation

## 2022-02-08 DIAGNOSIS — L89616 Pressure-induced deep tissue damage of right heel: Secondary | ICD-10-CM | POA: Insufficient documentation

## 2022-02-08 DIAGNOSIS — F03B4 Unspecified dementia, moderate, with anxiety: Secondary | ICD-10-CM

## 2022-02-08 DIAGNOSIS — Z515 Encounter for palliative care: Secondary | ICD-10-CM

## 2022-02-08 DIAGNOSIS — L89152 Pressure ulcer of sacral region, stage 2: Secondary | ICD-10-CM | POA: Insufficient documentation

## 2022-02-08 DIAGNOSIS — S72002A Fracture of unspecified part of neck of left femur, initial encounter for closed fracture: Secondary | ICD-10-CM

## 2022-02-08 DIAGNOSIS — M5416 Radiculopathy, lumbar region: Secondary | ICD-10-CM

## 2022-02-08 MED ORDER — MELATONIN 3 MG PO CAPS: 1.0000 | ORAL_CAPSULE | Freq: Every day | ORAL | 0 refills | Status: AC

## 2022-02-08 MED ORDER — CEPHALEXIN 500 MG PO CAPS: 500.0000 mg | ORAL_CAPSULE | Freq: Three times a day (TID) | ORAL | 0 refills | Status: AC

## 2022-02-08 MED ORDER — OXYCODONE HCL 5 MG PO TABS: ORAL_TABLET | ORAL | 0 refills | Status: AC

## 2022-02-08 MED ORDER — METHOCARBAMOL 500 MG PO TABS: 500.0000 mg | ORAL_TABLET | Freq: Three times a day (TID) | ORAL | Status: AC | PRN

## 2022-02-08 MED ORDER — ACETAMINOPHEN 500 MG PO TABS: 1000.0000 mg | ORAL_TABLET | Freq: Three times a day (TID) | ORAL | 0 refills | Status: AC
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
## 2022-02-10 ENCOUNTER — Encounter: Payer: Medicare Other | Admitting: Physical Therapy

## 2022-02-13 ENCOUNTER — Encounter: Payer: Medicare Other | Admitting: Physical Therapy

## 2022-02-15 ENCOUNTER — Encounter: Payer: Medicare Other | Admitting: Physical Therapy

## 2022-02-17 ENCOUNTER — Encounter: Payer: Medicare Other | Admitting: Physical Therapy

## 2022-03-18 DEATH — deceased

## 2022-07-19 IMAGING — DX DG FEMUR 2+V PORT*L*
1 series · 4 of 4 positions shown · non-contrast
Comparison: None.

CLINICAL DATA: Known left femoral fracture

EXAM:
LEFT FEMUR PORTABLE 2 VIEWS

[Series 1: femur · 0.14mm/px · 4 of 4 slices shown]
[im 1/4]
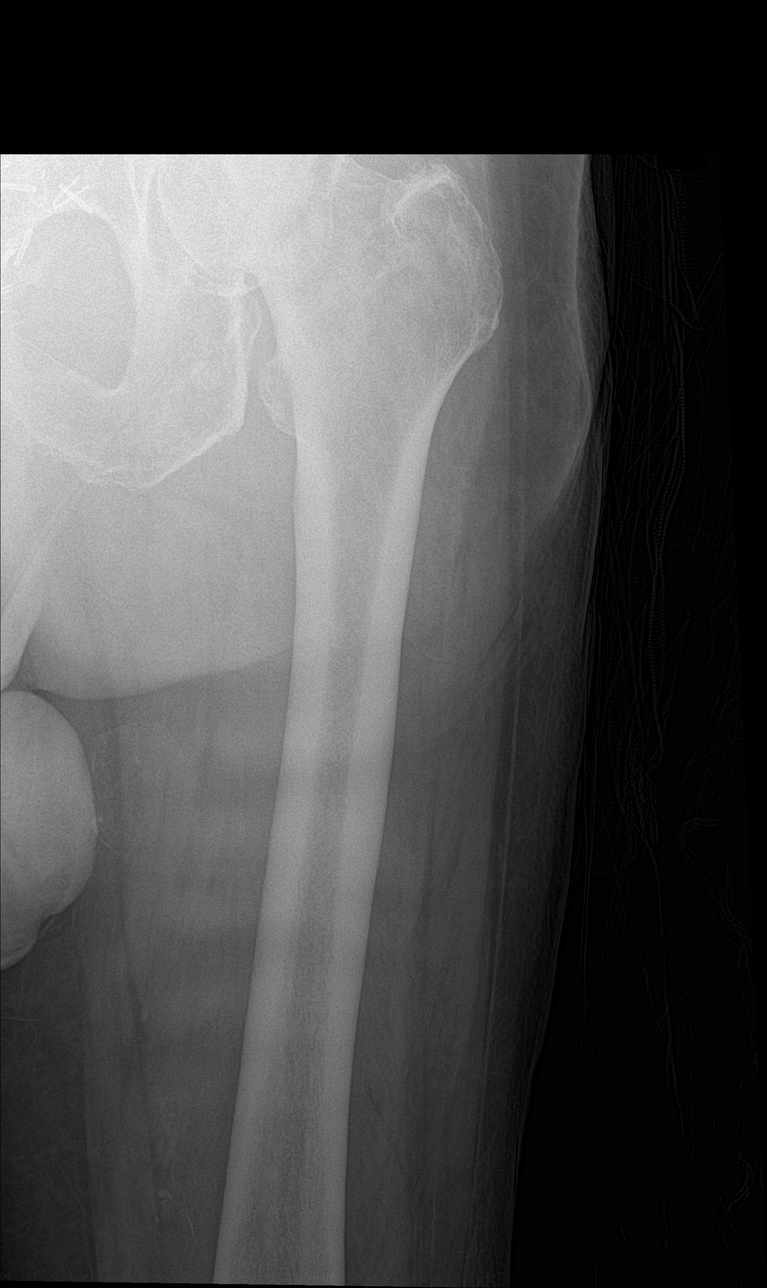
[im 2/4]
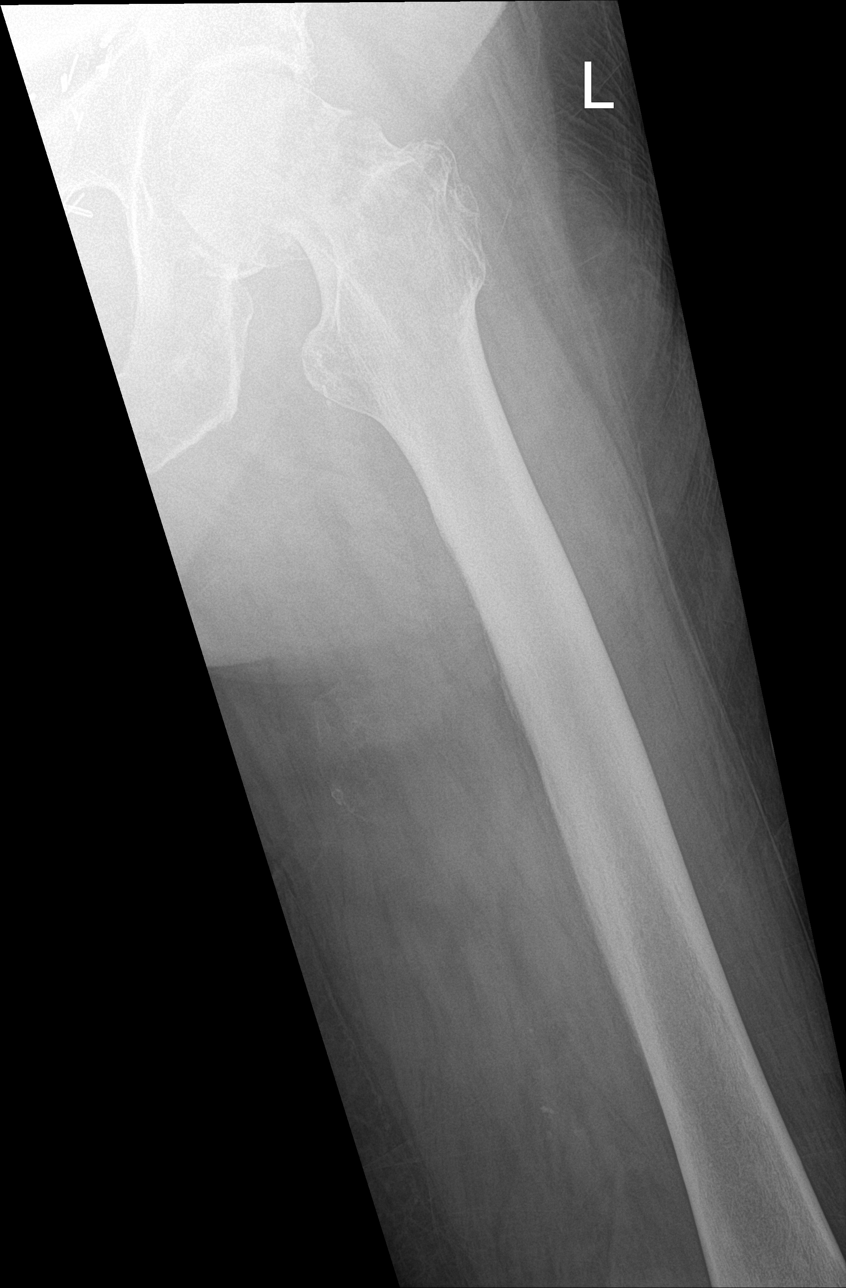
[im 3/4]
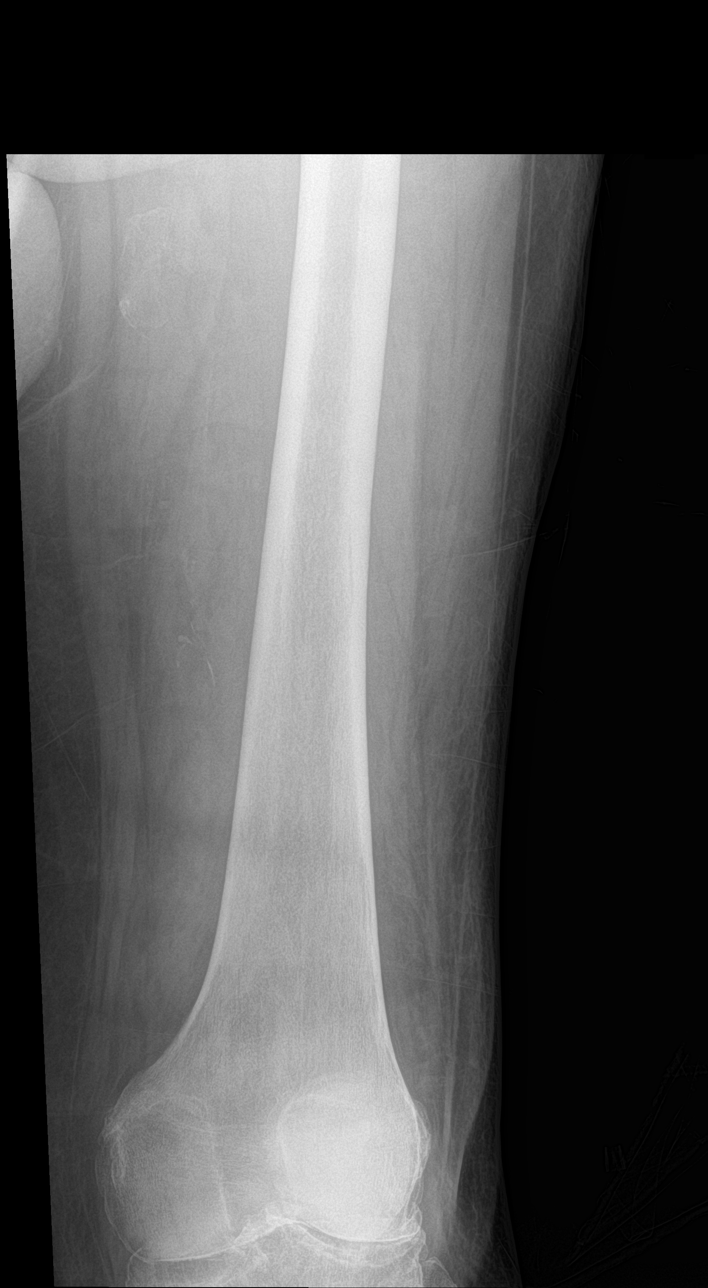
[im 4/4]
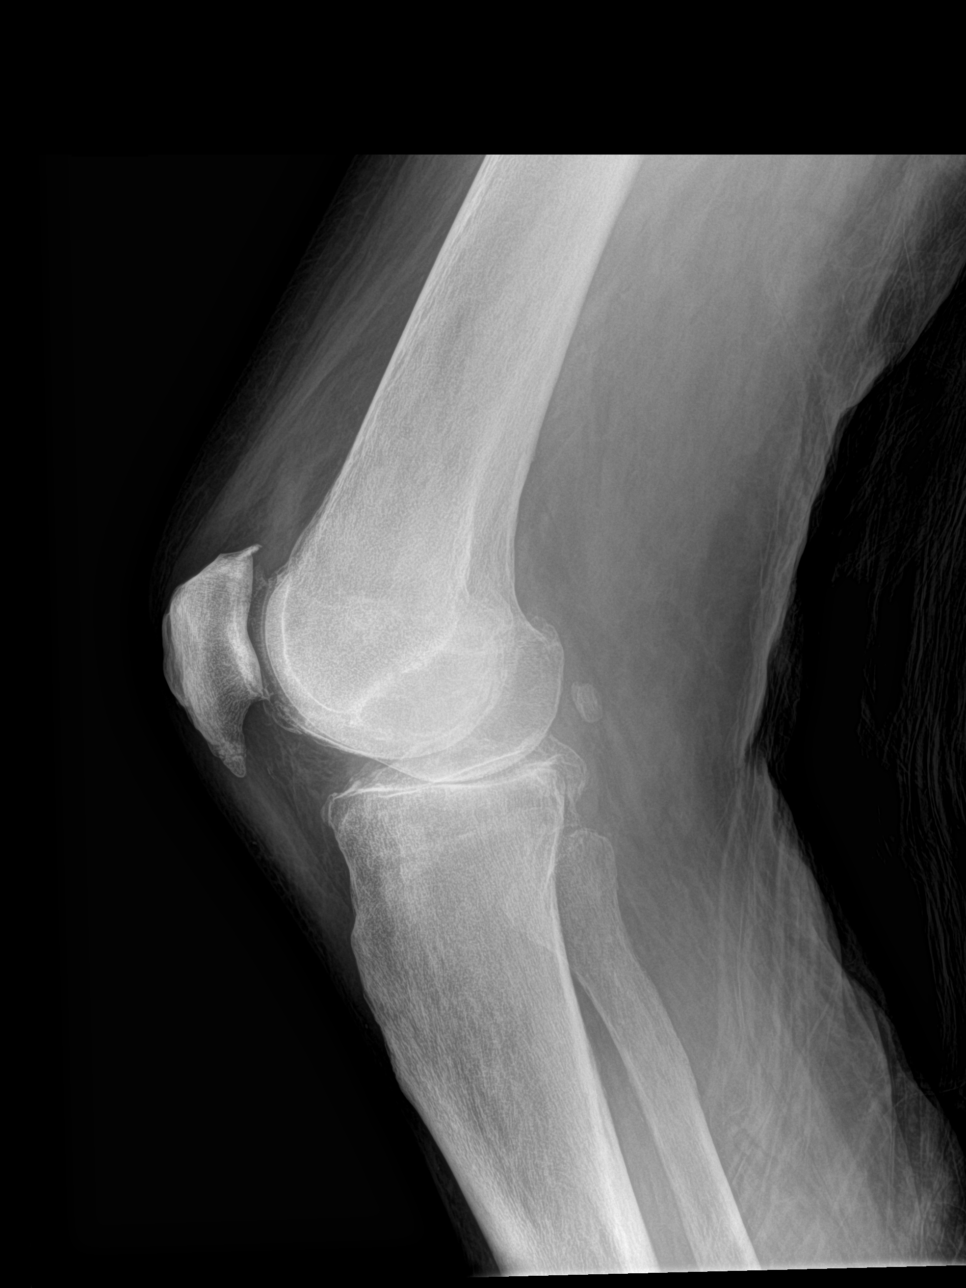

[4 of 4 positions shown; findings below may reference images not displayed]

FINDINGS: Left femoral neck fracture is noted with impaction at the fracture
site. Distal femur is within normal limits. Degenerative changes
about the knee joint are seen.
IMPRESSION: Left femoral neck fracture with impaction.

## 2022-07-19 IMAGING — DX DG PELVIS 1-2V
1 series · 1 of 1 positions shown · non-contrast
Comparison: Films from earlier in the same day.

CLINICAL DATA: Known left hip fracture

EXAM:
PELVIS - 1 VIEW

[pelvis]
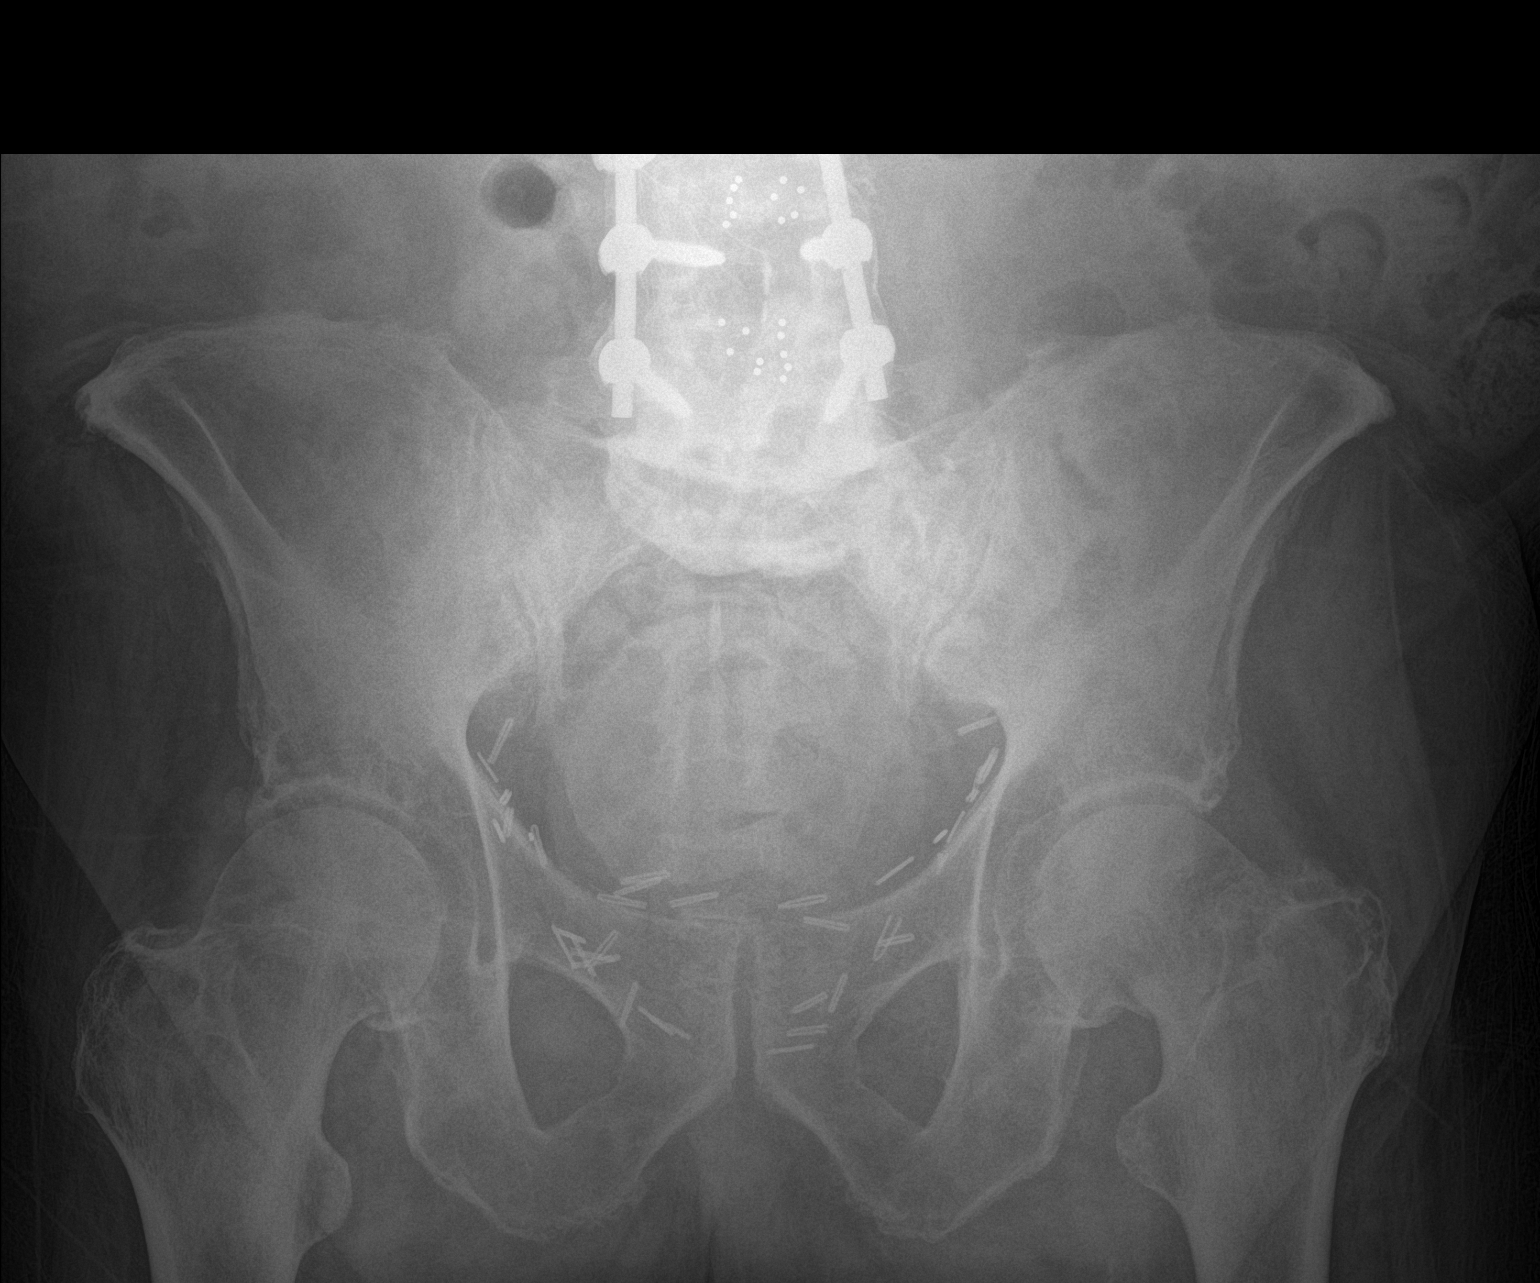

[1 of 1 positions shown; findings below may reference images not displayed]

FINDINGS: Pelvic ring is intact. Postsurgical changes in lumbar spine and
pelvis are seen. Left subcapital femoral neck fracture is noted with
impaction at the fracture site. No other focal abnormality is noted.
IMPRESSION: Left femoral neck fracture with impaction.

## 2022-07-19 IMAGING — DX DG CHEST 1V PORT
1 series · 1 of 1 positions shown · non-contrast
Comparison: 12/24/2015

CLINICAL DATA: Preop evaluation, known history of femoral neck
fracture

EXAM:
PORTABLE CHEST 1 VIEW

[chest]
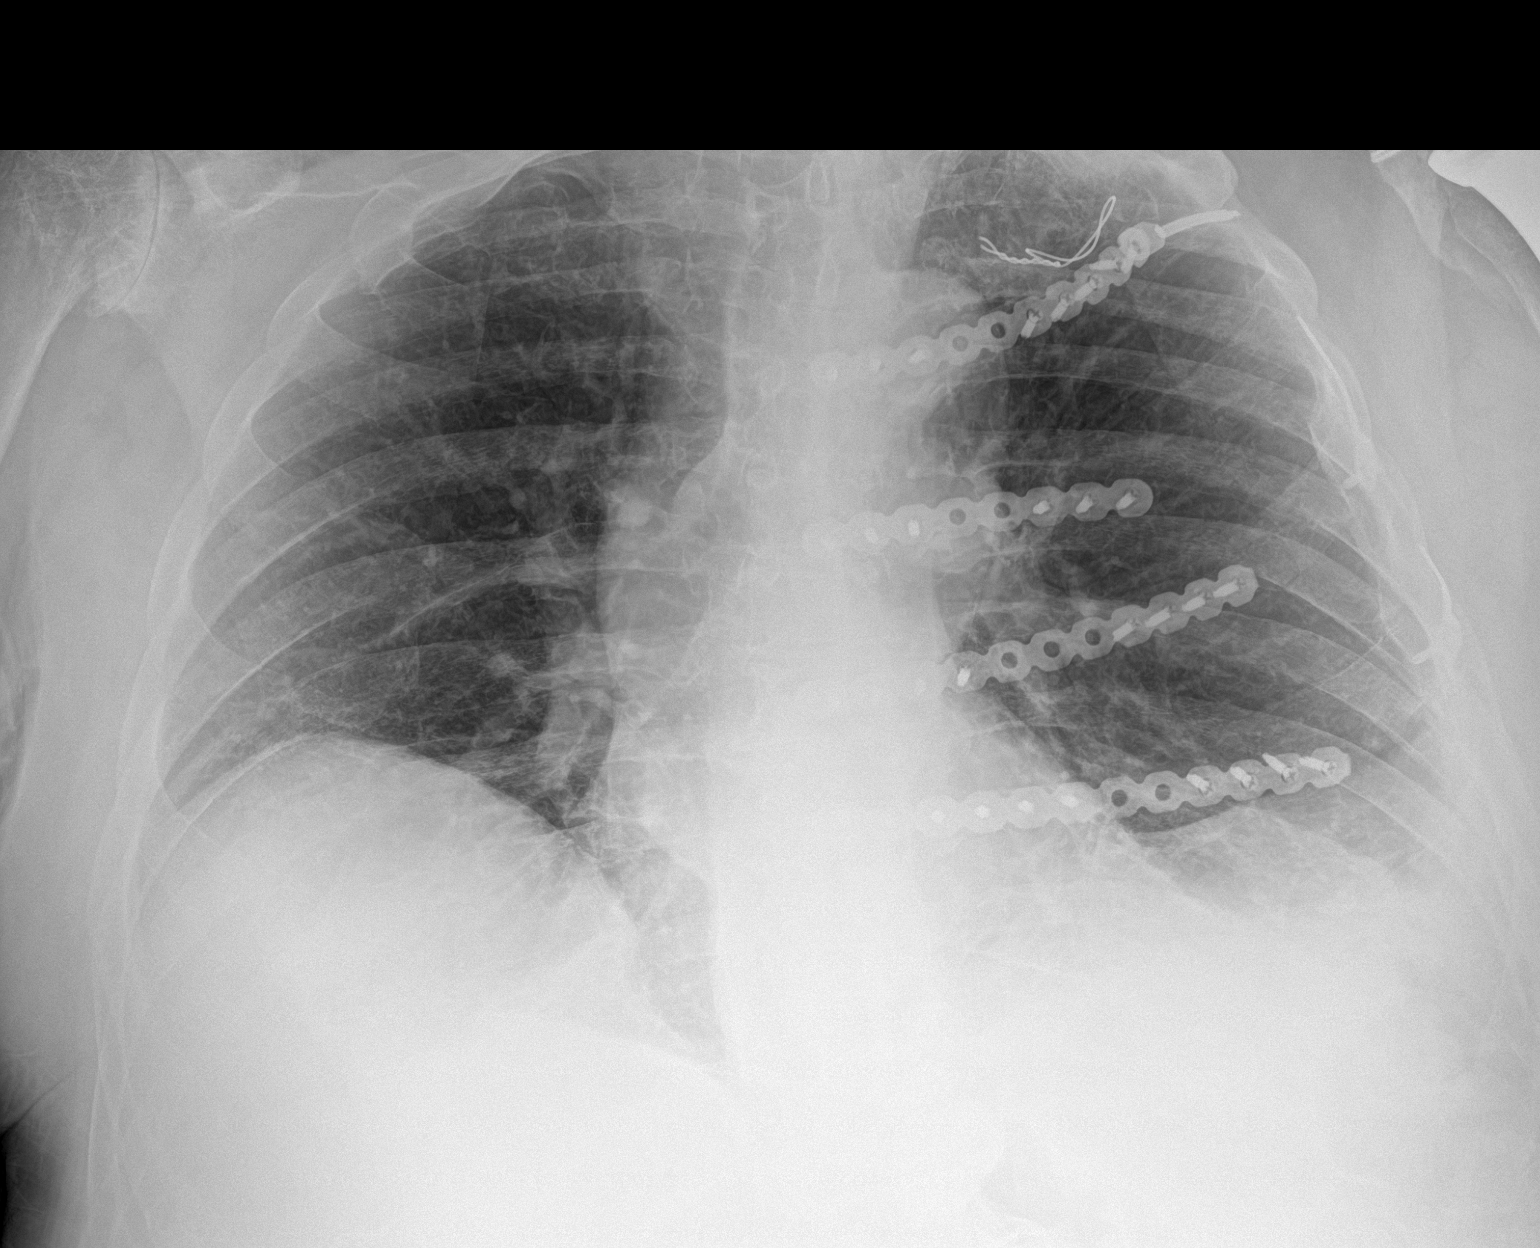

[1 of 1 positions shown; findings below may reference images not displayed]

FINDINGS: Cardiac shadow is within normal limits. Postsurgical changes in the
left chest wall and left shoulder are seen. No acute bony
abnormality is noted. The lungs are clear bilaterally.
IMPRESSION: No acute abnormality noted.

## 2022-07-20 IMAGING — DX DG HIP (WITH PELVIS) OPERATIVE*L*
2 series · 2 of 2 positions shown · non-contrast
Comparison: Exam of earlier the same date.

CLINICAL DATA: Postoperative LEFT hip arthroplasty.

EXAM:
OPERATIVE LEFT HIP (WITH PELVIS IF PERFORMED) 2 VIEWS
TECHNIQUE: Fluoroscopic spot image(s) were submitted for interpretation
post-operatively.

[pelvis]
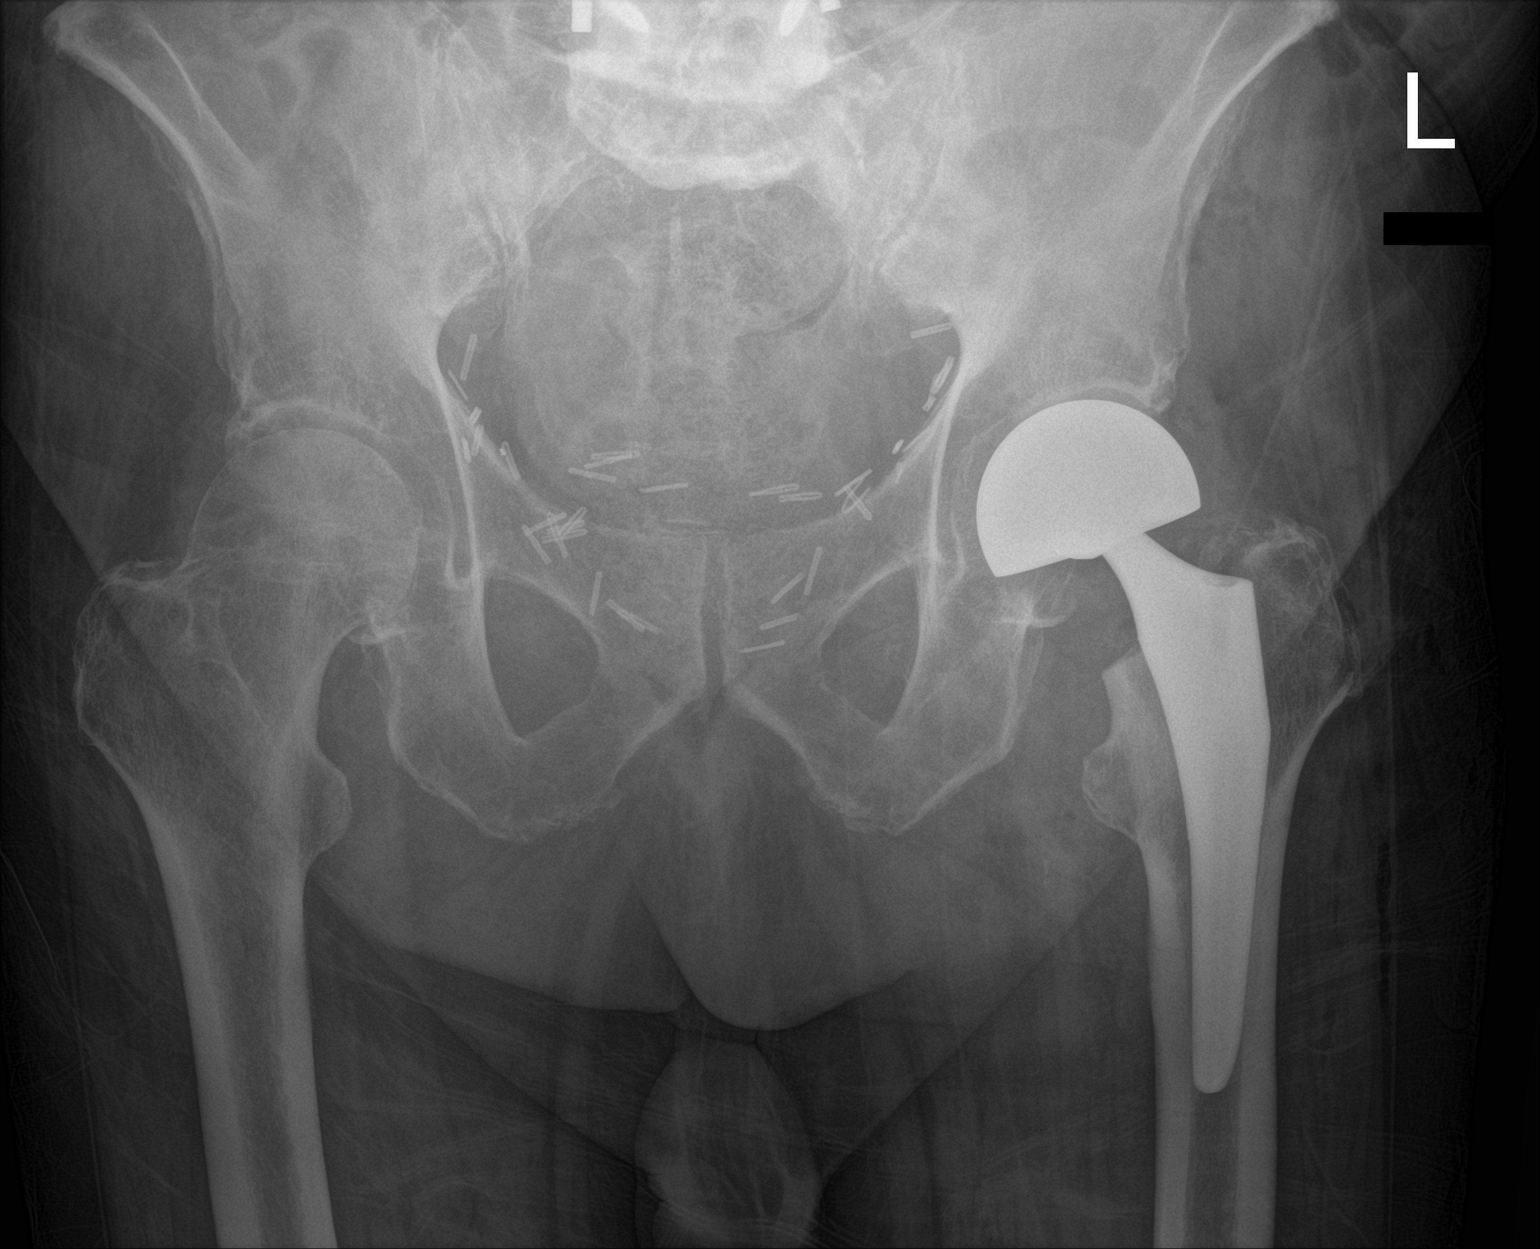

[hip]
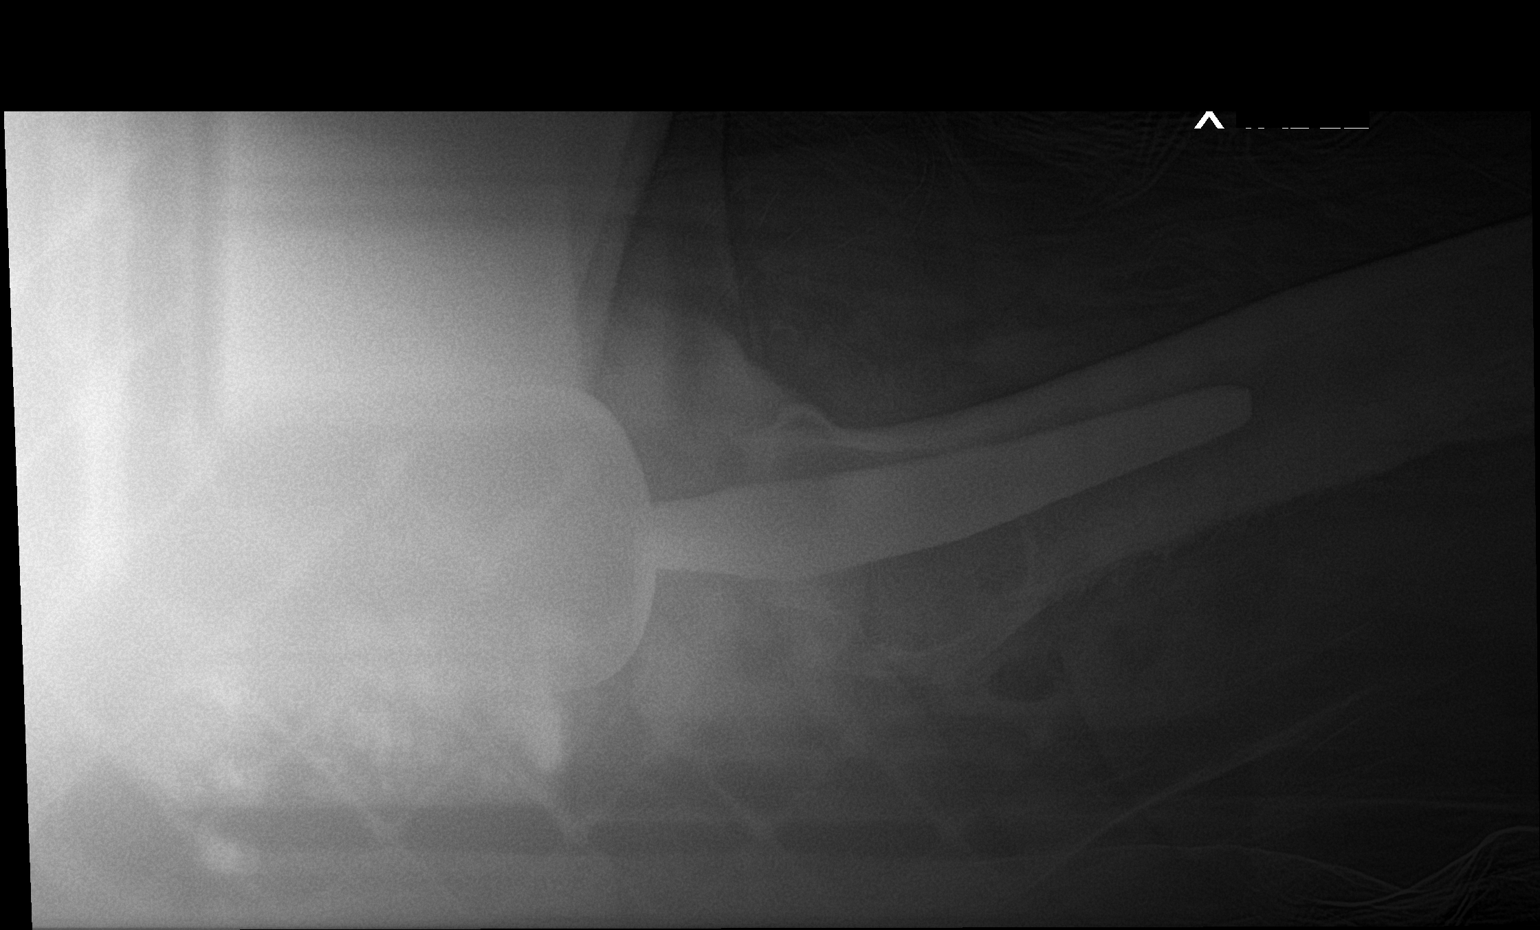

[2 of 2 positions shown; findings below may reference images not displayed]

FINDINGS: Surgical clips are noted about the pelvis.

Interval LEFT hip arthroplasty. Acetabular component appears well
placed on the AP projection but cannot be well evaluated on the
lateral projection due to artifact from adjacent support apparatus.
Gas present in the soft tissues about the LEFT hip compatible with
recent surgery.

Femoral component appears well seated without signs of acute
process. Limited view of the pelvis excludes the upper iliac crests.
Pelvis otherwise unremarkable.
IMPRESSION: Interval LEFT hip arthroplasty as above. No immediate complications.
Postoperative changes about the LEFT hip.

Acetabular component not well assessed on cross-table lateral due to
adjacent support devices.

## 2022-07-20 IMAGING — CR DG HIP (WITH OR WITHOUT PELVIS) 1V*L*
1 series · 1 of 1 positions shown · non-contrast
Comparison: 01/19/2022

CLINICAL DATA: Fracture proximal left femur

EXAM:
DG HIP (WITH OR WITHOUT PELVIS) 1V*L*

[AP]
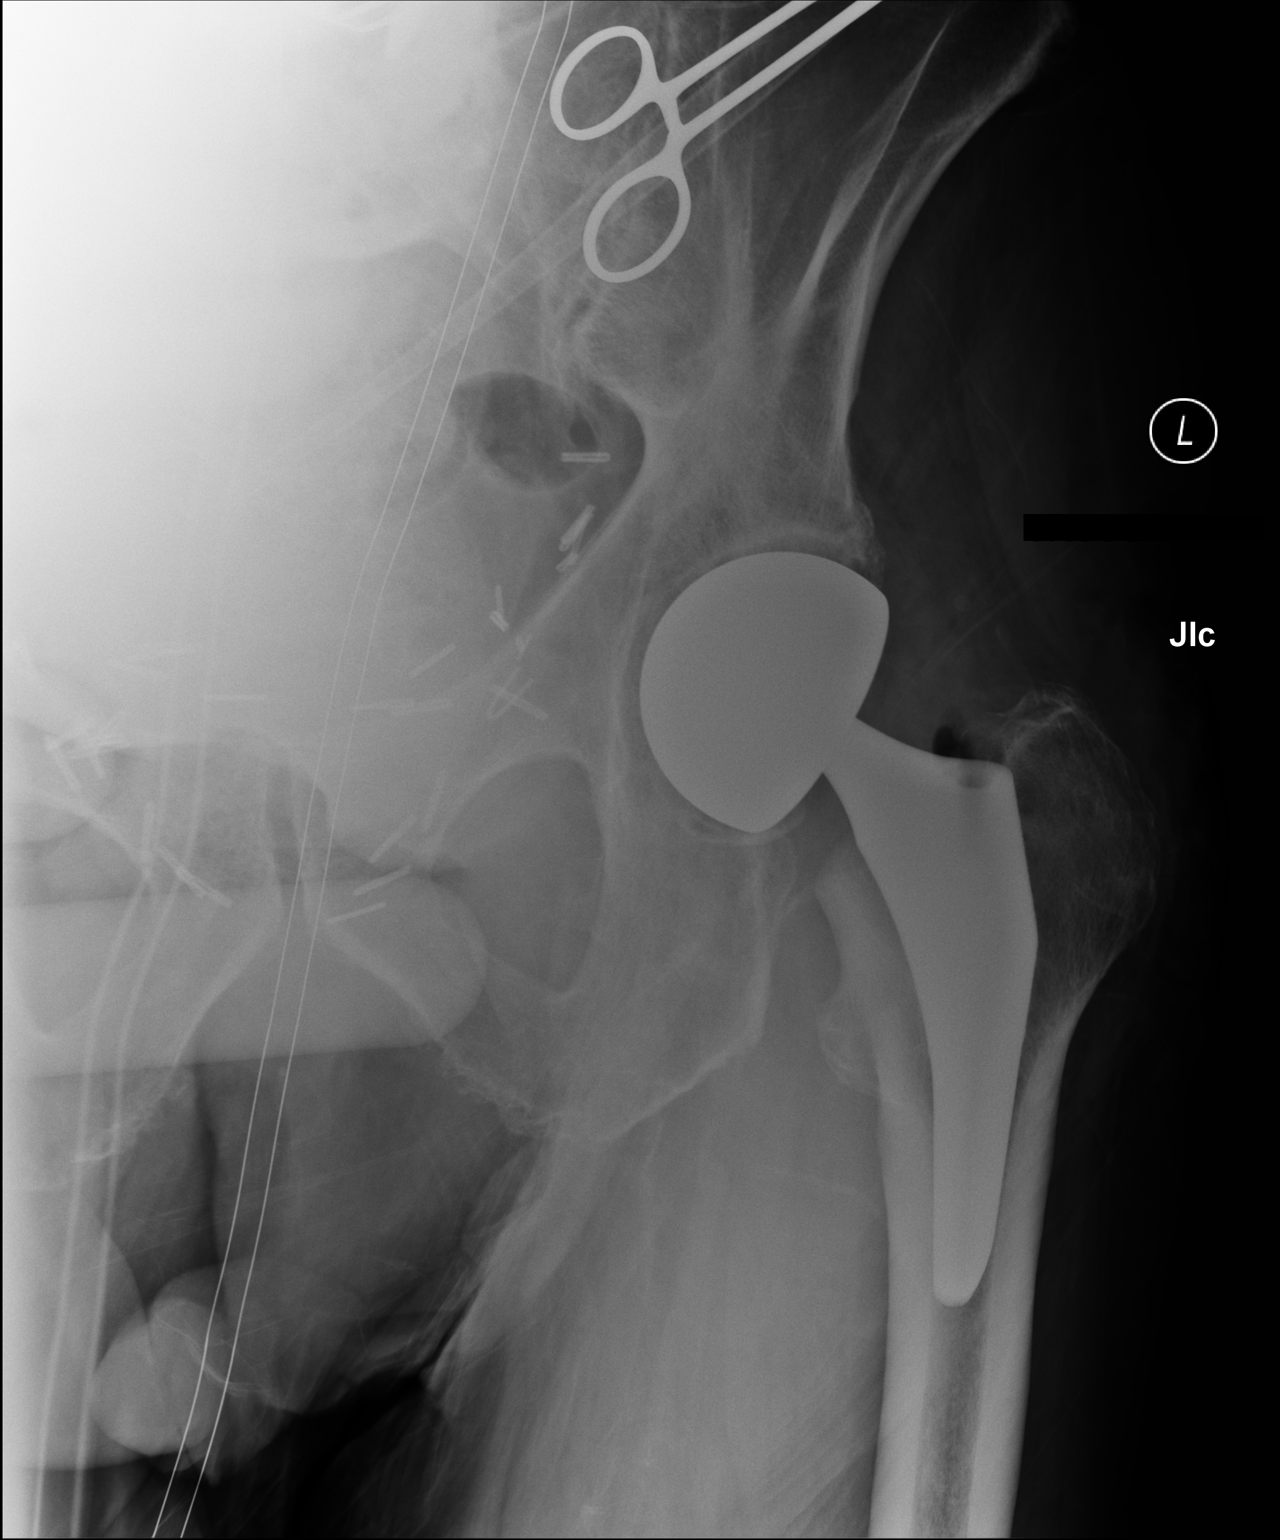

[1 of 1 positions shown; findings below may reference images not displayed]

FINDINGS: There is interval left hip arthroplasty. No fracture lines are seen.
Surgical clips are seen in the pelvis.
IMPRESSION: Interval left hip arthroplasty.
